# Patient Record
Sex: Male | Born: 1940 | Race: White | Hispanic: No | Marital: Married | Smoking: Former smoker
Health system: Southern US, Community
[De-identification: ages and names within clinical notes are randomized; demographics above are authoritative.]

## PROBLEM LIST (undated history)

## (undated) DIAGNOSIS — Z8546 Personal history of malignant neoplasm of prostate: Secondary | ICD-10-CM

## (undated) DIAGNOSIS — J181 Lobar pneumonia, unspecified organism: Secondary | ICD-10-CM

## (undated) DIAGNOSIS — K5792 Diverticulitis of intestine, part unspecified, without perforation or abscess without bleeding: Secondary | ICD-10-CM

## (undated) DIAGNOSIS — M1990 Unspecified osteoarthritis, unspecified site: Secondary | ICD-10-CM

## (undated) DIAGNOSIS — C439 Malignant melanoma of skin, unspecified: Secondary | ICD-10-CM

## (undated) DIAGNOSIS — E039 Hypothyroidism, unspecified: Secondary | ICD-10-CM

## (undated) DIAGNOSIS — R32 Unspecified urinary incontinence: Secondary | ICD-10-CM

## (undated) DIAGNOSIS — C9110 Chronic lymphocytic leukemia of B-cell type not having achieved remission: Secondary | ICD-10-CM

## (undated) DIAGNOSIS — F419 Anxiety disorder, unspecified: Secondary | ICD-10-CM

## (undated) HISTORY — DX: Chronic lymphocytic leukemia of B-cell type not having achieved remission: C91.10

## (undated) HISTORY — PX: REPLACEMENT TOTAL KNEE: SUR1224

## (undated) HISTORY — DX: Malignant melanoma of skin, unspecified: C43.9

## (undated) HISTORY — DX: Diverticulitis of intestine, part unspecified, without perforation or abscess without bleeding: K57.92

## (undated) HISTORY — PX: COLONOSCOPY: SHX174

## (undated) HISTORY — DX: Unspecified osteoarthritis, unspecified site: M19.90

## (undated) HISTORY — DX: Unspecified urinary incontinence: R32

---

## 1991-03-25 HISTORY — PX: PROSTATECTOMY: SHX69

## 2004-12-03 ENCOUNTER — Other Ambulatory Visit: Payer: Self-pay

## 2004-12-10 ENCOUNTER — Inpatient Hospital Stay: Payer: Self-pay | Admitting: General Practice

## 2005-05-07 ENCOUNTER — Ambulatory Visit: Payer: Self-pay | Admitting: Internal Medicine

## 2006-06-02 ENCOUNTER — Ambulatory Visit: Payer: Self-pay | Admitting: Internal Medicine

## 2006-06-05 ENCOUNTER — Ambulatory Visit: Payer: Self-pay | Admitting: Internal Medicine

## 2006-07-07 ENCOUNTER — Ambulatory Visit: Payer: Self-pay | Admitting: Urology

## 2006-07-07 ENCOUNTER — Other Ambulatory Visit: Payer: Self-pay

## 2006-07-14 ENCOUNTER — Ambulatory Visit: Payer: Self-pay | Admitting: Urology

## 2006-08-27 ENCOUNTER — Ambulatory Visit: Payer: Self-pay | Admitting: Urology

## 2006-11-27 ENCOUNTER — Ambulatory Visit: Payer: Self-pay | Admitting: Urology

## 2007-09-09 ENCOUNTER — Ambulatory Visit: Payer: Self-pay | Admitting: Urology

## 2008-02-24 ENCOUNTER — Ambulatory Visit: Payer: Self-pay | Admitting: Unknown Physician Specialty

## 2008-06-21 ENCOUNTER — Ambulatory Visit: Payer: Self-pay | Admitting: Internal Medicine

## 2008-06-22 ENCOUNTER — Ambulatory Visit: Payer: Self-pay | Admitting: Internal Medicine

## 2008-07-07 ENCOUNTER — Ambulatory Visit: Payer: Self-pay | Admitting: Internal Medicine

## 2008-07-22 ENCOUNTER — Ambulatory Visit: Payer: Self-pay | Admitting: Internal Medicine

## 2008-09-07 ENCOUNTER — Ambulatory Visit: Payer: Self-pay | Admitting: Urology

## 2008-10-06 ENCOUNTER — Ambulatory Visit: Payer: Self-pay | Admitting: Internal Medicine

## 2008-10-22 ENCOUNTER — Ambulatory Visit: Payer: Self-pay | Admitting: Internal Medicine

## 2009-06-22 ENCOUNTER — Ambulatory Visit: Payer: Self-pay | Admitting: Internal Medicine

## 2009-07-02 ENCOUNTER — Ambulatory Visit: Payer: Self-pay | Admitting: Internal Medicine

## 2009-07-06 ENCOUNTER — Ambulatory Visit: Payer: Self-pay | Admitting: Internal Medicine

## 2009-07-22 ENCOUNTER — Ambulatory Visit: Payer: Self-pay | Admitting: Internal Medicine

## 2009-08-02 ENCOUNTER — Ambulatory Visit: Payer: Self-pay | Admitting: Cardiology

## 2009-08-21 ENCOUNTER — Emergency Department: Payer: Self-pay | Admitting: Emergency Medicine

## 2009-08-28 ENCOUNTER — Ambulatory Visit: Payer: Self-pay | Admitting: General Practice

## 2009-08-31 ENCOUNTER — Ambulatory Visit: Payer: Self-pay | Admitting: Urology

## 2009-09-05 ENCOUNTER — Ambulatory Visit: Payer: Self-pay | Admitting: Urology

## 2009-09-06 ENCOUNTER — Ambulatory Visit: Payer: Self-pay | Admitting: Urology

## 2009-09-07 ENCOUNTER — Ambulatory Visit: Payer: Self-pay | Admitting: Internal Medicine

## 2009-09-11 ENCOUNTER — Ambulatory Visit: Payer: Self-pay | Admitting: Urology

## 2009-09-21 ENCOUNTER — Ambulatory Visit: Payer: Self-pay | Admitting: Internal Medicine

## 2009-10-22 ENCOUNTER — Ambulatory Visit: Payer: Self-pay | Admitting: Internal Medicine

## 2009-11-22 ENCOUNTER — Ambulatory Visit: Payer: Self-pay | Admitting: Internal Medicine

## 2009-12-22 ENCOUNTER — Ambulatory Visit: Payer: Self-pay | Admitting: Internal Medicine

## 2010-01-22 ENCOUNTER — Ambulatory Visit: Payer: Self-pay | Admitting: Internal Medicine

## 2010-02-21 ENCOUNTER — Ambulatory Visit: Payer: Self-pay | Admitting: Internal Medicine

## 2010-02-28 ENCOUNTER — Inpatient Hospital Stay: Payer: Self-pay | Admitting: Internal Medicine

## 2010-03-24 ENCOUNTER — Ambulatory Visit: Payer: Self-pay | Admitting: Internal Medicine

## 2010-04-11 ENCOUNTER — Ambulatory Visit: Payer: Self-pay | Admitting: Specialist

## 2010-04-24 ENCOUNTER — Ambulatory Visit: Payer: Self-pay | Admitting: Internal Medicine

## 2010-05-23 ENCOUNTER — Ambulatory Visit: Payer: Self-pay | Admitting: Internal Medicine

## 2010-06-23 ENCOUNTER — Ambulatory Visit: Payer: Self-pay | Admitting: Internal Medicine

## 2010-07-23 ENCOUNTER — Ambulatory Visit: Payer: Self-pay | Admitting: Internal Medicine

## 2010-08-30 ENCOUNTER — Ambulatory Visit: Payer: Self-pay | Admitting: Internal Medicine

## 2010-09-22 ENCOUNTER — Ambulatory Visit: Payer: Self-pay | Admitting: Internal Medicine

## 2010-10-23 ENCOUNTER — Ambulatory Visit: Payer: Self-pay | Admitting: Internal Medicine

## 2010-11-28 ENCOUNTER — Ambulatory Visit: Payer: Self-pay | Admitting: Internal Medicine

## 2010-12-23 ENCOUNTER — Ambulatory Visit: Payer: Self-pay | Admitting: Internal Medicine

## 2011-01-23 ENCOUNTER — Ambulatory Visit: Payer: Self-pay | Admitting: Internal Medicine

## 2011-02-22 ENCOUNTER — Ambulatory Visit: Payer: Self-pay | Admitting: Internal Medicine

## 2011-03-28 ENCOUNTER — Ambulatory Visit: Payer: Self-pay | Admitting: Internal Medicine

## 2011-03-28 LAB — CBC CANCER CENTER
Basophil #: 0.1 x10 3/mm (ref 0.0–0.1)
Basophil %: 1.3 %
Eosinophil #: 0.2 x10 3/mm (ref 0.0–0.7)
HCT: 45.6 % (ref 40.0–52.0)
HGB: 15.6 g/dL (ref 13.0–18.0)
Lymphocyte %: 21 %
MCH: 32.6 pg (ref 26.0–34.0)
MCHC: 34.2 g/dL (ref 32.0–36.0)
Monocyte #: 0.5 x10 3/mm (ref 0.0–0.7)
Neutrophil #: 6.5 x10 3/mm (ref 1.4–6.5)
Neutrophil %: 70.6 %
Platelet: 113 x10 3/mm — ABNORMAL LOW (ref 150–440)
RDW: 14.2 % (ref 11.5–14.5)

## 2011-03-28 LAB — HEPATIC FUNCTION PANEL A (ARMC)
Albumin: 3.8 g/dL (ref 3.4–5.0)
Alkaline Phosphatase: 91 U/L (ref 50–136)
Bilirubin, Direct: 0.1 mg/dL (ref 0.00–0.20)
SGOT(AST): 18 U/L (ref 15–37)
SGPT (ALT): 40 U/L

## 2011-03-28 LAB — CREATININE, SERUM
Creatinine: 1.31 mg/dL — ABNORMAL HIGH (ref 0.60–1.30)
EGFR (African American): >60
EGFR (Non-African Amer.): 57 — ABNORMAL LOW

## 2011-04-25 ENCOUNTER — Ambulatory Visit: Payer: Self-pay | Admitting: Internal Medicine

## 2011-05-09 LAB — CBC CANCER CENTER
Basophil %: 0.4 %
HCT: 43.7 % (ref 40.0–52.0)
HGB: 15 g/dL (ref 13.0–18.0)
Lymphocyte %: 26.7 %
Monocyte #: 0.5 x10 3/mm (ref 0.0–0.7)
Monocyte %: 7.7 %
Neutrophil #: 4.2 x10 3/mm (ref 1.4–6.5)
Platelet: 103 x10 3/mm — ABNORMAL LOW (ref 150–440)
WBC: 6.6 x10 3/mm (ref 3.8–10.6)

## 2011-05-23 ENCOUNTER — Ambulatory Visit: Payer: Self-pay | Admitting: Internal Medicine

## 2011-06-13 LAB — HEPATIC FUNCTION PANEL A (ARMC)
Albumin: 3.6 g/dL (ref 3.4–5.0)
SGOT(AST): 24 U/L (ref 15–37)
SGPT (ALT): 36 U/L
Total Protein: 6.5 g/dL (ref 6.4–8.2)

## 2011-06-13 LAB — CBC CANCER CENTER
Eosinophil #: 0.2 x10 3/mm (ref 0.0–0.7)
Eosinophil %: 3.3 %
Lymphocyte #: 1.4 x10 3/mm (ref 1.0–3.6)
MCH: 33 pg (ref 26.0–34.0)
MCHC: 34 g/dL (ref 32.0–36.0)
MCV: 97 fL (ref 80–100)
Monocyte #: 0.3 x10 3/mm (ref 0.0–0.7)
Neutrophil %: 71.4 %
Platelet: 105 x10 3/mm — ABNORMAL LOW (ref 150–440)
RBC: 4.52 10*6/uL (ref 4.40–5.90)
RDW: 14.4 % (ref 11.5–14.5)

## 2011-06-13 LAB — CREATININE, SERUM
Creatinine: 1.27 mg/dL (ref 0.60–1.30)
EGFR (Non-African Amer.): 59 — ABNORMAL LOW

## 2011-06-13 LAB — LACTATE DEHYDROGENASE: LDH: 177 U/L (ref 87–241)

## 2011-06-23 ENCOUNTER — Ambulatory Visit: Payer: Self-pay | Admitting: Internal Medicine

## 2011-07-07 LAB — CBC CANCER CENTER
Basophil #: 0 x10 3/mm (ref 0.0–0.1)
Eosinophil #: 0.2 x10 3/mm (ref 0.0–0.7)
HGB: 15.5 g/dL (ref 13.0–18.0)
Lymphocyte %: 33.2 %
MCH: 32.2 pg (ref 26.0–34.0)
MCHC: 33 g/dL (ref 32.0–36.0)
MCV: 97 fL (ref 80–100)
Monocyte %: 9.2 %
Neutrophil %: 53.2 %
Platelet: 106 x10 3/mm — ABNORMAL LOW (ref 150–440)
RBC: 4.83 10*6/uL (ref 4.40–5.90)
RDW: 13.8 % (ref 11.5–14.5)
WBC: 5.9 x10 3/mm (ref 3.8–10.6)

## 2011-07-11 ENCOUNTER — Ambulatory Visit: Payer: Self-pay | Admitting: Unknown Physician Specialty

## 2011-07-23 ENCOUNTER — Ambulatory Visit: Payer: Self-pay | Admitting: Internal Medicine

## 2011-08-29 ENCOUNTER — Ambulatory Visit: Payer: Self-pay | Admitting: Internal Medicine

## 2011-08-29 LAB — CBC CANCER CENTER
Eosinophil #: 0.2 x10 3/mm (ref 0.0–0.7)
Eosinophil %: 4.1 %
HGB: 15.5 g/dL (ref 13.0–18.0)
Monocyte %: 8.7 %
Neutrophil #: 2 x10 3/mm (ref 1.4–6.5)
Platelet: 91 x10 3/mm — ABNORMAL LOW (ref 150–440)
RBC: 4.86 10*6/uL (ref 4.40–5.90)
WBC: 4 x10 3/mm (ref 3.8–10.6)

## 2011-08-29 LAB — HEPATIC FUNCTION PANEL A (ARMC)
Albumin: 4.1 g/dL (ref 3.4–5.0)
Bilirubin, Direct: 0.2 mg/dL (ref 0.00–0.20)
SGPT (ALT): 43 U/L

## 2011-08-29 LAB — CREATININE, SERUM: EGFR (Non-African Amer.): 56 — ABNORMAL LOW

## 2011-08-29 LAB — LACTATE DEHYDROGENASE: LDH: 174 U/L (ref 87–241)

## 2011-09-22 ENCOUNTER — Ambulatory Visit: Payer: Self-pay | Admitting: Internal Medicine

## 2011-10-10 LAB — CBC CANCER CENTER
Basophil #: 0 x10 3/mm (ref 0.0–0.1)
Basophil %: 0.4 %
Eosinophil #: 0.1 x10 3/mm (ref 0.0–0.7)
HCT: 45.4 % (ref 40.0–52.0)
HGB: 14.9 g/dL (ref 13.0–18.0)
MCH: 31.9 pg (ref 26.0–34.0)
MCHC: 32.9 g/dL (ref 32.0–36.0)
Monocyte #: 0.3 x10 3/mm (ref 0.2–1.0)
Neutrophil #: 5.5 x10 3/mm (ref 1.4–6.5)
Neutrophil %: 69.8 %
RDW: 13.6 % (ref 11.5–14.5)
WBC: 7.9 x10 3/mm (ref 3.8–10.6)

## 2011-10-10 LAB — CREATININE, SERUM: EGFR (African American): >60

## 2011-10-23 ENCOUNTER — Ambulatory Visit: Payer: Self-pay | Admitting: Internal Medicine

## 2011-11-21 LAB — CBC CANCER CENTER
Basophil %: 1 %
Eosinophil #: 0.2 x10 3/mm (ref 0.0–0.7)
Eosinophil %: 2.4 %
HCT: 47.3 % (ref 40.0–52.0)
Lymphocyte #: 2.5 x10 3/mm (ref 1.0–3.6)
Lymphocyte %: 33.9 %
MCH: 32.3 pg (ref 26.0–34.0)
MCHC: 32.5 g/dL (ref 32.0–36.0)
Neutrophil %: 55.6 %
Platelet: 91 x10 3/mm — ABNORMAL LOW (ref 150–440)
RDW: 14.1 % (ref 11.5–14.5)

## 2011-11-21 LAB — CREATININE, SERUM
EGFR (African American): 57 — ABNORMAL LOW
EGFR (Non-African Amer.): 49 — ABNORMAL LOW

## 2011-11-21 LAB — HEPATIC FUNCTION PANEL A (ARMC)
Bilirubin, Direct: 0.2 mg/dL (ref 0.00–0.20)
Bilirubin,Total: 1 mg/dL (ref 0.2–1.0)
SGPT (ALT): 43 U/L (ref 12–78)

## 2011-11-21 LAB — LACTATE DEHYDROGENASE: LDH: 151 U/L (ref 81–234)

## 2011-11-23 ENCOUNTER — Ambulatory Visit: Payer: Self-pay | Admitting: Internal Medicine

## 2012-02-26 ENCOUNTER — Ambulatory Visit: Payer: Self-pay | Admitting: Internal Medicine

## 2012-02-26 LAB — CBC CANCER CENTER
Basophil %: 0.8 %
Eosinophil #: 0.2 x10 3/mm (ref 0.0–0.7)
Eosinophil %: 2.6 %
HGB: 16.1 g/dL (ref 13.0–18.0)
Lymphocyte %: 39.6 %
MCH: 33.5 pg (ref 26.0–34.0)
MCV: 98 fL (ref 80–100)
Monocyte #: 0.5 x10 3/mm (ref 0.2–1.0)
Monocyte %: 7.2 %
Neutrophil %: 49.8 %
Platelet: 90 x10 3/mm — ABNORMAL LOW (ref 150–440)
RBC: 4.8 10*6/uL (ref 4.40–5.90)
RDW: 13.2 % (ref 11.5–14.5)
WBC: 7.3 x10 3/mm (ref 3.8–10.6)

## 2012-02-26 LAB — CREATININE, SERUM
Creatinine: 1.19 mg/dL (ref 0.60–1.30)
EGFR (African American): >60

## 2012-03-24 ENCOUNTER — Ambulatory Visit: Payer: Self-pay | Admitting: Internal Medicine

## 2012-05-07 ENCOUNTER — Ambulatory Visit: Payer: Self-pay | Admitting: Internal Medicine

## 2012-05-07 LAB — HEPATIC FUNCTION PANEL A (ARMC)
Albumin: 4.1 g/dL (ref 3.4–5.0)
Alkaline Phosphatase: 79 U/L (ref 50–136)
Bilirubin, Direct: 0.2 mg/dL (ref 0.00–0.20)
Bilirubin,Total: 0.8 mg/dL (ref 0.2–1.0)
SGOT(AST): 21 U/L (ref 15–37)
SGPT (ALT): 45 U/L (ref 12–78)

## 2012-05-07 LAB — CREATININE, SERUM: EGFR (Non-African Amer.): 52 — ABNORMAL LOW

## 2012-05-07 LAB — CBC CANCER CENTER
Basophil #: 0.1 x10 3/mm (ref 0.0–0.1)
Eosinophil #: 0.2 x10 3/mm (ref 0.0–0.7)
Eosinophil %: 2.6 %
HCT: 49.9 % (ref 40.0–52.0)
MCH: 32.9 pg (ref 26.0–34.0)
MCHC: 33.8 g/dL (ref 32.0–36.0)
Monocyte %: 6.6 %
WBC: 7.2 x10 3/mm (ref 3.8–10.6)

## 2012-05-07 LAB — LACTATE DEHYDROGENASE: LDH: 153 U/L (ref 85–241)

## 2012-05-22 ENCOUNTER — Ambulatory Visit: Payer: Self-pay | Admitting: Internal Medicine

## 2012-07-02 ENCOUNTER — Ambulatory Visit: Payer: Self-pay | Admitting: Otolaryngology

## 2012-07-22 ENCOUNTER — Ambulatory Visit: Payer: Self-pay | Admitting: Internal Medicine

## 2012-07-29 LAB — CBC CANCER CENTER
Basophil #: 0.1 x10 3/mm (ref 0.0–0.1)
HCT: 47.9 % (ref 40.0–52.0)
HGB: 16.2 g/dL (ref 13.0–18.0)
Lymphocyte #: 6.2 x10 3/mm — ABNORMAL HIGH (ref 1.0–3.6)
Lymphocyte %: 54.7 %
MCH: 32.8 pg (ref 26.0–34.0)
MCHC: 33.8 g/dL (ref 32.0–36.0)
Monocyte %: 4.6 %
WBC: 11.4 x10 3/mm — ABNORMAL HIGH (ref 3.8–10.6)

## 2012-07-29 LAB — CREATININE, SERUM
EGFR (African American): 56 — ABNORMAL LOW
EGFR (Non-African Amer.): 49 — ABNORMAL LOW

## 2012-08-05 ENCOUNTER — Ambulatory Visit: Payer: Self-pay | Admitting: Otolaryngology

## 2012-08-22 ENCOUNTER — Ambulatory Visit: Payer: Self-pay | Admitting: Internal Medicine

## 2012-10-22 ENCOUNTER — Ambulatory Visit: Payer: Self-pay | Admitting: Internal Medicine

## 2012-10-22 LAB — LACTATE DEHYDROGENASE: LDH: 143 U/L (ref 85–241)

## 2012-10-22 LAB — CBC CANCER CENTER
Basophil %: 1 %
HCT: 47.4 % (ref 40.0–52.0)
HGB: 16.4 g/dL (ref 13.0–18.0)
Lymphocyte #: 7.2 x10 3/mm — ABNORMAL HIGH (ref 1.0–3.6)
Lymphocyte %: 58.5 %
MCV: 97 fL (ref 80–100)
Neutrophil %: 34.3 %
Platelet: 82 x10 3/mm — ABNORMAL LOW (ref 150–440)
RBC: 4.88 10*6/uL (ref 4.40–5.90)
WBC: 12.3 x10 3/mm — ABNORMAL HIGH (ref 3.8–10.6)

## 2012-10-22 LAB — CREATININE, SERUM: EGFR (African American): 57 — ABNORMAL LOW

## 2012-10-22 LAB — HEPATIC FUNCTION PANEL A (ARMC)
Albumin: 4 g/dL (ref 3.4–5.0)
Bilirubin, Direct: 0.2 mg/dL (ref 0.00–0.20)
SGOT(AST): 21 U/L (ref 15–37)
SGPT (ALT): 34 U/L (ref 12–78)
Total Protein: 6.4 g/dL (ref 6.4–8.2)

## 2012-11-22 ENCOUNTER — Ambulatory Visit: Payer: Self-pay | Admitting: Internal Medicine

## 2013-01-14 ENCOUNTER — Ambulatory Visit: Payer: Self-pay | Admitting: Internal Medicine

## 2013-01-14 LAB — URINALYSIS, COMPLETE
Bacteria: NONE SEEN
Bilirubin,UR: NEGATIVE
Glucose,UR: NEGATIVE mg/dL (ref 0–75)
Ketone: NEGATIVE
Leukocyte Esterase: NEGATIVE
Nitrite: NEGATIVE
Ph: 6 (ref 4.5–8.0)
Specific Gravity: 1.017 (ref 1.003–1.030)
Squamous Epithelial: 2
WBC UR: NONE SEEN /HPF (ref 0–5)

## 2013-01-14 LAB — COMPREHENSIVE METABOLIC PANEL
Anion Gap: 5 — ABNORMAL LOW (ref 7–16)
BUN: 21 mg/dL — ABNORMAL HIGH (ref 7–18)
Calcium, Total: 9.2 mg/dL (ref 8.5–10.1)
Chloride: 106 mmol/L (ref 98–107)
Creatinine: 1.29 mg/dL (ref 0.60–1.30)
Glucose: 119 mg/dL — ABNORMAL HIGH (ref 65–99)
Osmolality: 282 (ref 275–301)
Potassium: 4.6 mmol/L (ref 3.5–5.1)
SGPT (ALT): 40 U/L (ref 12–78)

## 2013-01-14 LAB — CBC CANCER CENTER
Basophil %: 1 %
HGB: 16.6 g/dL (ref 13.0–18.0)
Lymphocyte %: 71.6 %
MCHC: 33.4 g/dL (ref 32.0–36.0)
Monocyte %: 2.9 %
Neutrophil %: 23.5 %
RBC: 5.02 10*6/uL (ref 4.40–5.90)
WBC: 19.4 x10 3/mm — ABNORMAL HIGH (ref 3.8–10.6)

## 2013-01-14 LAB — TSH: Thyroid Stimulating Horm: 5.14 u[IU]/mL — ABNORMAL HIGH

## 2013-01-14 LAB — LIPID PANEL
Cholesterol: 157 mg/dL (ref 0–200)
HDL Cholesterol: 56 mg/dL (ref 40–60)
Ldl Cholesterol, Calc: 81 mg/dL (ref 0–100)
Triglycerides: 102 mg/dL (ref 0–200)

## 2013-01-14 LAB — CBC WITH DIFFERENTIAL/PLATELET
HGB: 16.6 g/dL (ref 13.0–18.0)
MCH: 33.1 pg (ref 26.0–34.0)
MCHC: 33.7 g/dL (ref 32.0–36.0)
MCV: 98 fL (ref 80–100)
Platelet: 83 10*3/uL — ABNORMAL LOW (ref 150–440)
RDW: 13.4 % (ref 11.5–14.5)
Segmented Neutrophils: 23 %
WBC: 20.4 10*3/uL — ABNORMAL HIGH (ref 3.8–10.6)

## 2013-01-20 ENCOUNTER — Ambulatory Visit: Payer: Self-pay | Admitting: Internal Medicine

## 2013-01-22 ENCOUNTER — Ambulatory Visit: Payer: Self-pay | Admitting: Internal Medicine

## 2013-01-25 LAB — PSA: PSA: 0.3 ng/mL (ref 0.0–4.0)

## 2013-02-21 ENCOUNTER — Ambulatory Visit: Payer: Self-pay | Admitting: Internal Medicine

## 2013-04-14 ENCOUNTER — Ambulatory Visit: Payer: Self-pay | Admitting: Internal Medicine

## 2013-04-14 LAB — CBC CANCER CENTER
BASOS ABS: 0.4 x10 3/mm — AB (ref 0.0–0.1)
BASOS PCT: 1.2 %
Eosinophil #: 0.2 x10 3/mm (ref 0.0–0.7)
Eosinophil %: 0.4 %
HCT: 47.5 % (ref 40.0–52.0)
HGB: 15.6 g/dL (ref 13.0–18.0)
LYMPHS PCT: 81.8 %
Lymphocyte #: 29.1 x10 3/mm — ABNORMAL HIGH (ref 1.0–3.6)
MCH: 32.7 pg (ref 26.0–34.0)
MCHC: 32.8 g/dL (ref 32.0–36.0)
MCV: 100 fL (ref 80–100)
MONOS PCT: 2.6 %
Monocyte #: 0.9 x10 3/mm (ref 0.2–1.0)
NEUTROS ABS: 5 x10 3/mm (ref 1.4–6.5)
Neutrophil %: 14 %
PLATELETS: 94 x10 3/mm — AB (ref 150–440)
RBC: 4.75 10*6/uL (ref 4.40–5.90)
RDW: 13.3 % (ref 11.5–14.5)
WBC: 35.6 x10 3/mm — ABNORMAL HIGH (ref 3.8–10.6)

## 2013-04-14 LAB — HEPATIC FUNCTION PANEL A (ARMC)
ALBUMIN: 4.1 g/dL (ref 3.4–5.0)
AST: 21 U/L (ref 15–37)
Alkaline Phosphatase: 85 U/L
Bilirubin, Direct: 0.2 mg/dL (ref 0.00–0.20)
Bilirubin,Total: 0.8 mg/dL (ref 0.2–1.0)
SGPT (ALT): 36 U/L (ref 12–78)
Total Protein: 6.5 g/dL (ref 6.4–8.2)

## 2013-04-14 LAB — CREATININE, SERUM
Creatinine: 1.2 mg/dL (ref 0.60–1.30)
EGFR (African American): >60
EGFR (Non-African Amer.): 60 — ABNORMAL LOW

## 2013-04-14 LAB — LACTATE DEHYDROGENASE: LDH: 159 U/L (ref 85–241)

## 2013-04-24 ENCOUNTER — Ambulatory Visit: Payer: Self-pay | Admitting: Internal Medicine

## 2013-05-26 ENCOUNTER — Ambulatory Visit: Payer: Self-pay | Admitting: Internal Medicine

## 2013-05-26 LAB — CBC CANCER CENTER
BASOS PCT: 0.7 %
Basophil #: 0.2 x10 3/mm — ABNORMAL HIGH (ref 0.0–0.1)
EOS PCT: 0.8 %
Eosinophil #: 0.2 x10 3/mm (ref 0.0–0.7)
HCT: 47.6 % (ref 40.0–52.0)
HGB: 15.4 g/dL (ref 13.0–18.0)
LYMPHS PCT: 80.9 %
Lymphocyte #: 24.1 x10 3/mm — ABNORMAL HIGH (ref 1.0–3.6)
MCH: 32.4 pg (ref 26.0–34.0)
MCHC: 32.4 g/dL (ref 32.0–36.0)
MCV: 100 fL (ref 80–100)
MONO ABS: 0.9 x10 3/mm (ref 0.2–1.0)
Monocyte %: 2.9 %
NEUTROS PCT: 14.7 %
Neutrophil #: 4.4 x10 3/mm (ref 1.4–6.5)
Platelet: 81 x10 3/mm — ABNORMAL LOW (ref 150–440)
RBC: 4.75 10*6/uL (ref 4.40–5.90)
RDW: 13.3 % (ref 11.5–14.5)
WBC: 29.8 x10 3/mm — AB (ref 3.8–10.6)

## 2013-05-26 LAB — CREATININE, SERUM
CREATININE: 1.3 mg/dL (ref 0.60–1.30)
GFR CALC NON AF AMER: 54 — AB

## 2013-06-22 ENCOUNTER — Ambulatory Visit: Payer: Self-pay | Admitting: Internal Medicine

## 2013-07-08 LAB — CBC CANCER CENTER
BASOS ABS: 0.2 x10 3/mm — AB (ref 0.0–0.1)
Basophil %: 0.6 %
Eosinophil #: 0.3 x10 3/mm (ref 0.0–0.7)
Eosinophil %: 0.7 %
HCT: 49.6 % (ref 40.0–52.0)
HGB: 15.8 g/dL (ref 13.0–18.0)
LYMPHS PCT: 83.3 %
Lymphocyte #: 31.9 x10 3/mm — ABNORMAL HIGH (ref 1.0–3.6)
MCH: 32.3 pg (ref 26.0–34.0)
MCHC: 31.8 g/dL — AB (ref 32.0–36.0)
MCV: 102 fL — ABNORMAL HIGH (ref 80–100)
MONOS PCT: 2.4 %
Monocyte #: 0.9 x10 3/mm (ref 0.2–1.0)
NEUTROS PCT: 13 %
Neutrophil #: 5 x10 3/mm (ref 1.4–6.5)
Platelet: 79 x10 3/mm — ABNORMAL LOW (ref 150–440)
RBC: 4.89 10*6/uL (ref 4.40–5.90)
RDW: 13.2 % (ref 11.5–14.5)
WBC: 38.3 x10 3/mm — AB (ref 3.8–10.6)

## 2013-07-08 LAB — CREATININE, SERUM
Creatinine: 1.18 mg/dL (ref 0.60–1.30)
EGFR (African American): >60
EGFR (Non-African Amer.): >60

## 2013-07-08 LAB — HEPATIC FUNCTION PANEL A (ARMC)
ALBUMIN: 4 g/dL (ref 3.4–5.0)
ALK PHOS: 75 U/L
Bilirubin, Direct: 0.2 mg/dL (ref 0.00–0.20)
Bilirubin,Total: 0.8 mg/dL (ref 0.2–1.0)
SGOT(AST): 14 U/L — ABNORMAL LOW (ref 15–37)
SGPT (ALT): 35 U/L (ref 12–78)
Total Protein: 6.4 g/dL (ref 6.4–8.2)

## 2013-07-08 LAB — LACTATE DEHYDROGENASE: LDH: 131 U/L (ref 85–241)

## 2013-07-22 ENCOUNTER — Ambulatory Visit: Payer: Self-pay | Admitting: Internal Medicine

## 2013-09-08 ENCOUNTER — Ambulatory Visit: Payer: Self-pay | Admitting: Internal Medicine

## 2013-09-08 LAB — CBC CANCER CENTER
BASOS ABS: 0.4 x10 3/mm — AB (ref 0.0–0.1)
Basophil %: 0.8 %
Eosinophil #: 0.3 x10 3/mm (ref 0.0–0.7)
Eosinophil %: 0.7 %
HCT: 47.9 % (ref 40.0–52.0)
HGB: 15.6 g/dL (ref 13.0–18.0)
LYMPHS PCT: 88.1 %
Lymphocyte #: 44.6 x10 3/mm — ABNORMAL HIGH (ref 1.0–3.6)
MCH: 32.8 pg (ref 26.0–34.0)
MCHC: 32.6 g/dL (ref 32.0–36.0)
MCV: 101 fL — ABNORMAL HIGH (ref 80–100)
MONO ABS: 1 x10 3/mm (ref 0.2–1.0)
Monocyte %: 1.9 %
NEUTROS PCT: 8.5 %
Neutrophil #: 4.3 x10 3/mm (ref 1.4–6.5)
Platelet: 71 x10 3/mm — ABNORMAL LOW (ref 150–440)
RBC: 4.77 10*6/uL (ref 4.40–5.90)
RDW: 13.8 % (ref 11.5–14.5)
WBC: 50.7 x10 3/mm — ABNORMAL HIGH (ref 3.8–10.6)

## 2013-09-21 ENCOUNTER — Ambulatory Visit: Payer: Self-pay | Admitting: Internal Medicine

## 2013-10-31 ENCOUNTER — Ambulatory Visit: Payer: Self-pay | Admitting: General Surgery

## 2013-11-08 ENCOUNTER — Ambulatory Visit: Payer: Self-pay | Admitting: Internal Medicine

## 2013-11-10 ENCOUNTER — Ambulatory Visit: Payer: Self-pay | Admitting: Internal Medicine

## 2013-11-10 ENCOUNTER — Ambulatory Visit (INDEPENDENT_AMBULATORY_CARE_PROVIDER_SITE_OTHER): Payer: Medicare Other | Admitting: General Surgery

## 2013-11-10 ENCOUNTER — Encounter: Payer: Self-pay | Admitting: General Surgery

## 2013-11-10 VITALS — BP 130/70 | HR 68 | Resp 16 | Ht 70.0 in | Wt 221.0 lb

## 2013-11-10 DIAGNOSIS — R591 Generalized enlarged lymph nodes: Secondary | ICD-10-CM

## 2013-11-10 DIAGNOSIS — C8590 Non-Hodgkin lymphoma, unspecified, unspecified site: Secondary | ICD-10-CM

## 2013-11-10 DIAGNOSIS — C8589 Other specified types of non-Hodgkin lymphoma, extranodal and solid organ sites: Secondary | ICD-10-CM

## 2013-11-10 DIAGNOSIS — R161 Splenomegaly, not elsewhere classified: Secondary | ICD-10-CM

## 2013-11-10 DIAGNOSIS — R599 Enlarged lymph nodes, unspecified: Secondary | ICD-10-CM

## 2013-11-10 NOTE — Progress Notes (Signed)
Patient ID: Dean Collins, male   DOB: January 01, 1941, 73 y.o.   MRN: [id number]  Chief Complaint  Patient presents with  . Other    Enlarged cervical lymphnode    HPI Dean Collins is a 73 y.o. male who presents for an evaluation of a right cervical lymph node that is enlarged. The patient states his Dermatologist, Dean Barr, MD,  found the enlarged lymph node approximately 1 month ago during a routine skin check. He denies any pain in this area or difficulty swallowing. He has noticed in the last month he is having sweats and increasing shortness of breath with minimal activity.  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 He is followed by Dean Collins, M.Collins.   HPI  Past Medical History  Diagnosis Date  . Incontinence   . Arthritis   . Cancer     CLL prostate  . Melanoma     scalp  . Diverticulitis     Past Surgical History  Procedure Laterality Date  . Replacement total knee      x2  . Prostatectomy  1993  . Colonoscopy  2013-2014?    Family History  Problem Relation Age of Onset  . Heart attack Mother   . Heart attack Father     Social History History  Substance Use Topics  . Smoking status: Former Smoker    Quit date: 03/24/1990  . Smokeless tobacco: Not on file  . Alcohol Use: Yes     Comment: occasionally    No Known Allergies  Current Outpatient Prescriptions  Medication Sig Dispense Refill  . ciprofloxacin (CIPRO) 500 MG tablet Take by mouth 2 (two) times daily.       . metroNIDAZOLE (FLAGYL) 500 MG tablet Take 500 mg by mouth 3 (three) times daily.       . sertraline (ZOLOFT) 50 MG tablet Take 50 mg by mouth at bedtime.       . simvastatin (ZOCOR) 40 MG tablet Take 40 mg by mouth daily at 6 PM.        No current facility-administered medications for this visit.    Review of Systems Review of Systems  Constitutional: Negative.   Respiratory: Negative.   Cardiovascular: Negative.     Blood pressure 130/70, pulse 68, resp.  rate 16, height 5\' 10"  (1.778 m), weight 221 lb (100.245 kg).  Physical Exam Physical Exam  Constitutional: He is oriented to person, place, and time. He appears well-developed and well-nourished.  HENT:  Head:    Neck: Neck supple. No thyromegaly present.  Cardiovascular: Normal rate, regular rhythm and normal heart sounds.   No murmur heard. Pulmonary/Chest: Effort normal. No respiratory distress. He has no decreased breath sounds. He has no wheezes. He has no rhonchi. He has rales in the right lower field and the left lower field.  Abdominal: There is splenomegaly. There is no hepatomegaly. There is no tenderness.    Lymphadenopathy:    He has cervical adenopathy (single nodein the right superior posterior triangle.).    He has axillary adenopathy (right axilla only.).       Right: No inguinal adenopathy present.       Left: No inguinal adenopathy present.  Right axilla 2 cm node.  Neurological: He is alert and oriented to person, place, and time.  Skin: Skin is warm and dry.    Data Reviewed CT scan of the abdomen and pelvis dated November 08, 2013 obtained by the patient's PCP for  suspected diverticulitis was reviewed and compared to January 2015 exam. Marked splenomegaly is noted with significant progression since the ureter study. Incidentally detected were multiple gallstones. Extensive celiac, mesenteric and retroperitoneal adenopathy is noted. Diffuse changes of colonic diverticulosis without evidence of diverticulitis. Assessment    Recurrent lymphoma.     Plan    The case was discussed with Dean Collins, M.Collins. No biopsy is not required to confirm recurrent lymphoma. Dr. Ma Hillock will make arrangements for early patient reassessment to initiate suppressive chemotherapy.     PCP: Dean Collins Ref. Dr. Karle Collins   Robert Bellow 11/11/2013, 6:22 PM

## 2013-11-10 NOTE — Patient Instructions (Signed)
Patient to return as needed. The patient is aware to call back for any questions or concerns. 

## 2013-11-11 DIAGNOSIS — R161 Splenomegaly, not elsewhere classified: Secondary | ICD-10-CM | POA: Insufficient documentation

## 2013-11-11 DIAGNOSIS — C8590 Non-Hodgkin lymphoma, unspecified, unspecified site: Secondary | ICD-10-CM | POA: Insufficient documentation

## 2013-11-11 DIAGNOSIS — R591 Generalized enlarged lymph nodes: Secondary | ICD-10-CM | POA: Insufficient documentation

## 2013-11-11 LAB — CBC CANCER CENTER
Basophil: 1 %
Eosinophil: 1 %
HCT: 36.8 % — ABNORMAL LOW (ref 40.0–52.0)
HGB: 11.6 g/dL — AB (ref 13.0–18.0)
Lymphocytes: 70 %
MCH: 33.7 pg (ref 26.0–34.0)
MCHC: 31.4 g/dL — ABNORMAL LOW (ref 32.0–36.0)
MCV: 107 fL — ABNORMAL HIGH (ref 80–100)
Monocytes: 3 %
Other Cells Blood: 5 %
Platelet: 100 x10 3/mm — ABNORMAL LOW (ref 150–440)
RBC: 3.43 10*6/uL — ABNORMAL LOW (ref 4.40–5.90)
RDW: 15.1 % — ABNORMAL HIGH (ref 11.5–14.5)
Segmented Neutrophils: 4 %
Variant Lymphocyte: 16 %
WBC: 129.4 x10 3/mm — AB (ref 3.8–10.6)

## 2013-11-11 LAB — HEPATIC FUNCTION PANEL A (ARMC)
ALT: 33 U/L
Albumin: 3.8 g/dL (ref 3.4–5.0)
Alkaline Phosphatase: 94 U/L
BILIRUBIN DIRECT: 0.3 mg/dL — AB (ref 0.00–0.20)
Bilirubin,Total: 1.9 mg/dL — ABNORMAL HIGH (ref 0.2–1.0)
SGOT(AST): 29 U/L (ref 15–37)
Total Protein: 6.1 g/dL — ABNORMAL LOW (ref 6.4–8.2)

## 2013-11-11 LAB — CREATININE, SERUM
Creatinine: 1.27 mg/dL (ref 0.60–1.30)
EGFR (African American): >60
EGFR (Non-African Amer.): 55 — ABNORMAL LOW

## 2013-11-11 LAB — LACTATE DEHYDROGENASE: LDH: 446 U/L — ABNORMAL HIGH (ref 85–241)

## 2013-11-11 LAB — URIC ACID: URIC ACID: 5.4 mg/dL (ref 3.5–7.2)

## 2013-11-11 LAB — POTASSIUM: Potassium: 3.9 mmol/L (ref 3.5–5.1)

## 2013-11-22 ENCOUNTER — Ambulatory Visit: Payer: Self-pay | Admitting: Internal Medicine

## 2013-12-05 DIAGNOSIS — R0989 Other specified symptoms and signs involving the circulatory and respiratory systems: Secondary | ICD-10-CM

## 2013-12-05 DIAGNOSIS — M7989 Other specified soft tissue disorders: Secondary | ICD-10-CM

## 2013-12-05 DIAGNOSIS — R0609 Other forms of dyspnea: Secondary | ICD-10-CM

## 2013-12-05 LAB — COMPREHENSIVE METABOLIC PANEL
ALK PHOS: 92 U/L
ALT: 20 U/L
ANION GAP: 8 (ref 7–16)
Albumin: 3.5 g/dL (ref 3.4–5.0)
BUN: 20 mg/dL — ABNORMAL HIGH (ref 7–18)
Bilirubin,Total: 5.4 mg/dL — ABNORMAL HIGH (ref 0.2–1.0)
CALCIUM: 8.3 mg/dL — AB (ref 8.5–10.1)
Chloride: 104 mmol/L (ref 98–107)
Co2: 27 mmol/L (ref 21–32)
Creatinine: 1.22 mg/dL (ref 0.60–1.30)
EGFR (African American): >60
EGFR (Non-African Amer.): 58 — ABNORMAL LOW
Glucose: 158 mg/dL — ABNORMAL HIGH (ref 65–99)
OSMOLALITY: 283 (ref 275–301)
Potassium: 3.7 mmol/L (ref 3.5–5.1)
SGOT(AST): 24 U/L (ref 15–37)
Sodium: 139 mmol/L (ref 136–145)
Total Protein: 5.8 g/dL — ABNORMAL LOW (ref 6.4–8.2)

## 2013-12-05 LAB — CBC CANCER CENTER
BASOS ABS: 1 %
Comment - H1-Com4: NORMAL
Eosinophil: 1 %
HCT: 17.8 % — ABNORMAL LOW (ref 40.0–52.0)
HGB: 5.3 g/dL — AB (ref 13.0–18.0)
LYMPHS PCT: 92 %
MCH: 36.9 pg — AB (ref 26.0–34.0)
MCHC: 29.8 g/dL — AB (ref 32.0–36.0)
MCV: 124 fL — AB (ref 80–100)
MONOS PCT: 1 %
NRBC/100 WBC: 1 /100
Platelet: 119 x10 3/mm — ABNORMAL LOW (ref 150–440)
RBC: 1.44 10*6/uL — AB (ref 4.40–5.90)
RDW: 19.7 % — AB (ref 11.5–14.5)
SEGMENTED NEUTROPHILS: 5 %
WBC: 356 x10 3/mm (ref 3.8–10.6)

## 2013-12-05 LAB — RETICULOCYTES
Absolute Retic Count: 0.259 10*6/uL — ABNORMAL HIGH (ref 0.019–0.186)
Reticulocyte: 17.9 % — ABNORMAL HIGH (ref 0.4–3.1)

## 2013-12-05 LAB — BILIRUBIN, DIRECT: BILIRUBIN DIRECT: 0.4 mg/dL — AB (ref 0.00–0.20)

## 2013-12-07 ENCOUNTER — Inpatient Hospital Stay: Payer: Self-pay | Admitting: Internal Medicine

## 2013-12-07 DIAGNOSIS — R0609 Other forms of dyspnea: Secondary | ICD-10-CM

## 2013-12-07 DIAGNOSIS — R0989 Other specified symptoms and signs involving the circulatory and respiratory systems: Secondary | ICD-10-CM

## 2013-12-07 LAB — CK: CK, Total: 27 U/L — ABNORMAL LOW

## 2013-12-07 LAB — CBC CANCER CENTER
Comment - H1-Com4: NORMAL
HCT: 19.1 % — ABNORMAL LOW (ref 40.0–52.0)
HGB: 5.5 g/dL — AB (ref 13.0–18.0)
Lymphocytes: 90 %
MCH: 36.8 pg — ABNORMAL HIGH (ref 26.0–34.0)
MCHC: 28.9 g/dL — AB (ref 32.0–36.0)
MCV: 127 fL — ABNORMAL HIGH (ref 80–100)
MONOS PCT: 3 %
NRBC/100 WBC: 4 /100
Platelet: 128 x10 3/mm — ABNORMAL LOW (ref 150–440)
RBC: 1.5 10*6/uL — ABNORMAL LOW (ref 4.40–5.90)
RDW: 32.2 % — ABNORMAL HIGH (ref 11.5–14.5)
Segmented Neutrophils: 3 %
VARIANT LYMPHOCYTE - H4-RLYMPH: 4 %
WBC: 374.7 x10 3/mm (ref 3.8–10.6)

## 2013-12-07 LAB — BASIC METABOLIC PANEL
Anion Gap: 8 (ref 7–16)
BUN: 22 mg/dL — ABNORMAL HIGH (ref 7–18)
CALCIUM: 8.5 mg/dL (ref 8.5–10.1)
Chloride: 103 mmol/L (ref 98–107)
Co2: 28 mmol/L (ref 21–32)
Creatinine: 1.27 mg/dL (ref 0.60–1.30)
EGFR (African American): >60
GFR CALC NON AF AMER: 55 — AB
Glucose: 183 mg/dL — ABNORMAL HIGH (ref 65–99)
Osmolality: 286 (ref 275–301)
Potassium: 3.5 mmol/L (ref 3.5–5.1)
SODIUM: 139 mmol/L (ref 136–145)

## 2013-12-07 LAB — IRON AND TIBC
IRON BIND. CAP.(TOTAL): 249 ug/dL — AB (ref 250–450)
IRON SATURATION: 47 %
Iron: 118 ug/dL (ref 65–175)
UNBOUND IRON-BIND. CAP.: 131 ug/dL

## 2013-12-07 LAB — HEPATIC FUNCTION PANEL A (ARMC)
Albumin: 3.6 g/dL (ref 3.4–5.0)
Alkaline Phosphatase: 88 U/L
BILIRUBIN TOTAL: 6 mg/dL — AB (ref 0.2–1.0)
Bilirubin, Direct: 0.5 mg/dL — ABNORMAL HIGH (ref 0.00–0.20)
SGOT(AST): 22 U/L (ref 15–37)
SGPT (ALT): 22 U/L
TOTAL PROTEIN: 5.8 g/dL — AB (ref 6.4–8.2)

## 2013-12-07 LAB — CK-MB
CK-MB: 0.6 ng/mL (ref 0.5–3.6)
CK-MB: 0.8 ng/mL (ref 0.5–3.6)

## 2013-12-07 LAB — URIC ACID: URIC ACID: 7.2 mg/dL (ref 3.5–7.2)

## 2013-12-07 LAB — FOLATE: FOLIC ACID: 40.9 ng/mL (ref 3.1–100.0)

## 2013-12-07 LAB — TROPONIN I

## 2013-12-07 LAB — FERRITIN: Ferritin (ARMC): 357 ng/mL (ref 8–388)

## 2013-12-08 LAB — CBC WITH DIFFERENTIAL/PLATELET
Basophil #: 0.1 10*3/uL (ref 0.0–0.1)
Basophil %: 0 %
EOS ABS: 2.8 10*3/uL — AB (ref 0.0–0.7)
Eosinophil %: 0.7 %
HCT: 24.4 % — AB (ref 40.0–52.0)
HGB: 7.1 g/dL — ABNORMAL LOW (ref 13.0–18.0)
LYMPHS PCT: 91.3 %
MCH: 36 pg — AB (ref 26.0–34.0)
MCHC: 29 g/dL — AB (ref 32.0–36.0)
MCV: 124 fL — ABNORMAL HIGH (ref 80–100)
MONO ABS: 8.4 x10 3/mm — AB (ref 0.2–1.0)
Monocyte %: 2.2 %
NEUTROS PCT: 5.8 %
Neutrophil #: 22.4 10*3/uL — ABNORMAL HIGH (ref 1.4–6.5)
Platelet: 123 10*3/uL — ABNORMAL LOW (ref 150–440)
RBC: 1.96 10*6/uL — ABNORMAL LOW (ref 4.40–5.90)
RDW: 32.8 % — ABNORMAL HIGH (ref 11.5–14.5)

## 2013-12-08 LAB — BASIC METABOLIC PANEL
Anion Gap: 9 (ref 7–16)
BUN: 25 mg/dL — AB (ref 7–18)
Calcium, Total: 8.5 mg/dL (ref 8.5–10.1)
Chloride: 105 mmol/L (ref 98–107)
Co2: 26 mmol/L (ref 21–32)
Creatinine: 1.13 mg/dL (ref 0.60–1.30)
EGFR (African American): >60
Glucose: 163 mg/dL — ABNORMAL HIGH (ref 65–99)
OSMOLALITY: 287 (ref 275–301)
Potassium: 4 mmol/L (ref 3.5–5.1)
Sodium: 140 mmol/L (ref 136–145)

## 2013-12-09 LAB — CBC WITH DIFFERENTIAL/PLATELET
HCT: 23.5 % — ABNORMAL LOW (ref 40.0–52.0)
HGB: 7 g/dL — ABNORMAL LOW (ref 13.0–18.0)
LYMPHS PCT: 95 %
MCH: 36.3 pg — ABNORMAL HIGH (ref 26.0–34.0)
MCHC: 29.8 g/dL — AB (ref 32.0–36.0)
MCV: 122 fL — ABNORMAL HIGH (ref 80–100)
Platelet: 120 10*3/uL — ABNORMAL LOW (ref 150–440)
RBC: 1.93 10*6/uL — ABNORMAL LOW (ref 4.40–5.90)
RDW: 22.1 % — ABNORMAL HIGH (ref 11.5–14.5)
Segmented Neutrophils: 5 %
WBC: >300 10*3/uL — ABNORMAL HIGH (ref 3.8–10.6)

## 2013-12-09 LAB — BASIC METABOLIC PANEL
ANION GAP: 9 (ref 7–16)
BUN: 26 mg/dL — AB (ref 7–18)
CALCIUM: 8.2 mg/dL — AB (ref 8.5–10.1)
CHLORIDE: 108 mmol/L — AB (ref 98–107)
CO2: 25 mmol/L (ref 21–32)
Creatinine: 1.21 mg/dL (ref 0.60–1.30)
EGFR (Non-African Amer.): 59 — ABNORMAL LOW
GLUCOSE: 121 mg/dL — AB (ref 65–99)
OSMOLALITY: 289 (ref 275–301)
POTASSIUM: 4 mmol/L (ref 3.5–5.1)
SODIUM: 142 mmol/L (ref 136–145)

## 2013-12-12 LAB — HEPATIC FUNCTION PANEL A (ARMC)
ALT: 34 U/L
Albumin: 4 g/dL (ref 3.4–5.0)
Alkaline Phosphatase: 80 U/L
BILIRUBIN TOTAL: 3.1 mg/dL — AB (ref 0.2–1.0)
Bilirubin, Direct: 0.5 mg/dL — ABNORMAL HIGH (ref 0.00–0.20)
SGOT(AST): 42 U/L — ABNORMAL HIGH (ref 15–37)
TOTAL PROTEIN: 6.2 g/dL — AB (ref 6.4–8.2)

## 2013-12-12 LAB — BASIC METABOLIC PANEL
ANION GAP: 7 (ref 7–16)
BUN: 23 mg/dL — ABNORMAL HIGH (ref 7–18)
CHLORIDE: 105 mmol/L (ref 98–107)
CO2: 28 mmol/L (ref 21–32)
Calcium, Total: 8.5 mg/dL (ref 8.5–10.1)
Creatinine: 1.18 mg/dL (ref 0.60–1.30)
EGFR (African American): >60
EGFR (Non-African Amer.): >60
GLUCOSE: 97 mg/dL (ref 65–99)
OSMOLALITY: 283 (ref 275–301)
Potassium: 5.9 mmol/L — ABNORMAL HIGH (ref 3.5–5.1)
Sodium: 140 mmol/L (ref 136–145)

## 2013-12-12 LAB — URIC ACID: URIC ACID: 6.8 mg/dL (ref 3.5–7.2)

## 2013-12-14 LAB — CBC CANCER CENTER
HCT: 37.9 % — ABNORMAL LOW (ref 40.0–52.0)
HGB: 11.9 g/dL — AB (ref 13.0–18.0)
LYMPHS PCT: 86 %
MCH: 39.6 pg — ABNORMAL HIGH (ref 26.0–34.0)
MCHC: 31.5 g/dL — AB (ref 32.0–36.0)
MCV: 126 fL — ABNORMAL HIGH (ref 80–100)
MONOS PCT: 2 %
Platelet: 128 x10 3/mm — ABNORMAL LOW (ref 150–440)
RBC: 3.01 10*6/uL — AB (ref 4.40–5.90)
RDW: 17.8 % — ABNORMAL HIGH (ref 11.5–14.5)
Segmented Neutrophils: 3 %
Variant Lymphocyte: 9 %
WBC: 372.7 x10 3/mm (ref 3.8–10.6)

## 2013-12-14 LAB — BASIC METABOLIC PANEL
Anion Gap: 9 (ref 7–16)
BUN: 21 mg/dL — ABNORMAL HIGH (ref 7–18)
CALCIUM: 8.5 mg/dL (ref 8.5–10.1)
CHLORIDE: 102 mmol/L (ref 98–107)
Co2: 26 mmol/L (ref 21–32)
Creatinine: 1.28 mg/dL (ref 0.60–1.30)
EGFR (Non-African Amer.): 55 — ABNORMAL LOW
GLUCOSE: 146 mg/dL — AB (ref 65–99)
OSMOLALITY: 279 (ref 275–301)
Potassium: 3.9 mmol/L (ref 3.5–5.1)
Sodium: 137 mmol/L (ref 136–145)

## 2013-12-14 LAB — HEPATIC FUNCTION PANEL A (ARMC)
ALBUMIN: 3.9 g/dL (ref 3.4–5.0)
Alkaline Phosphatase: 76 U/L
BILIRUBIN DIRECT: 0.3 mg/dL — AB (ref 0.00–0.20)
BILIRUBIN TOTAL: 2.7 mg/dL — AB (ref 0.2–1.0)
SGOT(AST): 22 U/L (ref 15–37)
SGPT (ALT): 42 U/L
TOTAL PROTEIN: 5.9 g/dL — AB (ref 6.4–8.2)

## 2013-12-21 LAB — BASIC METABOLIC PANEL
ANION GAP: 7 (ref 7–16)
BUN: 23 mg/dL — AB (ref 7–18)
Calcium, Total: 8 mg/dL — ABNORMAL LOW (ref 8.5–10.1)
Chloride: 104 mmol/L (ref 98–107)
Co2: 26 mmol/L (ref 21–32)
Creatinine: 1.33 mg/dL — ABNORMAL HIGH (ref 0.60–1.30)
GFR CALC NON AF AMER: 56 — AB
GLUCOSE: 194 mg/dL — AB (ref 65–99)
Osmolality: 283 (ref 275–301)
POTASSIUM: 5.7 mmol/L — AB (ref 3.5–5.1)
SODIUM: 137 mmol/L (ref 136–145)

## 2013-12-21 LAB — CBC CANCER CENTER
BASOS ABS: 0.3 x10 3/mm — AB (ref 0.0–0.1)
Basophil %: 0.1 %
EOS ABS: 0.8 x10 3/mm — AB (ref 0.0–0.7)
EOS PCT: 0.3 %
HCT: 34.9 % — ABNORMAL LOW (ref 40.0–52.0)
HGB: 10.5 g/dL — ABNORMAL LOW (ref 13.0–18.0)
LYMPHS PCT: 93.8 %
Lymphocyte #: 283.6 x10 3/mm — ABNORMAL HIGH (ref 1.0–3.6)
MCH: 35.5 pg — ABNORMAL HIGH (ref 26.0–34.0)
MCHC: 30.1 g/dL — AB (ref 32.0–36.0)
MCV: 118 fL — AB (ref 80–100)
Monocyte #: 3.5 x10 3/mm — ABNORMAL HIGH (ref 0.2–1.0)
Monocyte %: 1.2 %
NEUTROS ABS: 13.9 x10 3/mm — AB (ref 1.4–6.5)
Neutrophil %: 4.6 %
Platelet: 99 x10 3/mm — ABNORMAL LOW (ref 150–440)
RBC: 2.97 10*6/uL — ABNORMAL LOW (ref 4.40–5.90)
RDW: 16.6 % — ABNORMAL HIGH (ref 11.5–14.5)
WBC: >300 x10 3/mm (ref 3.8–10.6)

## 2013-12-21 LAB — HEPATIC FUNCTION PANEL A (ARMC)
ALBUMIN: 4.1 g/dL (ref 3.4–5.0)
ALK PHOS: 59 U/L
AST: 26 U/L (ref 15–37)
BILIRUBIN DIRECT: 0.3 mg/dL — AB (ref 0.00–0.20)
BILIRUBIN TOTAL: 1.7 mg/dL — AB (ref 0.2–1.0)
SGPT (ALT): 32 U/L
Total Protein: 6 g/dL — ABNORMAL LOW (ref 6.4–8.2)

## 2013-12-21 LAB — URIC ACID: Uric Acid: 4.7 mg/dL (ref 3.5–7.2)

## 2013-12-22 ENCOUNTER — Ambulatory Visit: Payer: Self-pay | Admitting: Internal Medicine

## 2013-12-23 LAB — CBC CANCER CENTER
HCT: 35.8 % — AB (ref 40.0–52.0)
HGB: 11 g/dL — ABNORMAL LOW (ref 13.0–18.0)
Lymphocytes: 97 %
MCH: 35.8 pg — AB (ref 26.0–34.0)
MCHC: 30.8 g/dL — AB (ref 32.0–36.0)
MCV: 116 fL — AB (ref 80–100)
Platelet: 87 x10 3/mm — ABNORMAL LOW (ref 150–440)
RBC: 3.08 10*6/uL — ABNORMAL LOW (ref 4.40–5.90)
RDW: 16 % — AB (ref 11.5–14.5)
Segmented Neutrophils: 3 %
WBC: 305.3 x10 3/mm (ref 3.8–10.6)

## 2013-12-23 LAB — BASIC METABOLIC PANEL
Anion Gap: 6 — ABNORMAL LOW (ref 7–16)
BUN: 22 mg/dL — ABNORMAL HIGH (ref 7–18)
CALCIUM: 8.7 mg/dL (ref 8.5–10.1)
CO2: 31 mmol/L (ref 21–32)
CREATININE: 1.23 mg/dL (ref 0.60–1.30)
Chloride: 105 mmol/L (ref 98–107)
Glucose: 125 mg/dL — ABNORMAL HIGH (ref 65–99)
Osmolality: 288 (ref 275–301)
Potassium: 4 mmol/L (ref 3.5–5.1)
Sodium: 142 mmol/L (ref 136–145)

## 2013-12-23 LAB — HEPATIC FUNCTION PANEL A (ARMC)
ALT: 32 U/L
AST: 13 U/L — AB (ref 15–37)
Albumin: 4.2 g/dL (ref 3.4–5.0)
Alkaline Phosphatase: 56 U/L
BILIRUBIN TOTAL: 1.6 mg/dL — AB (ref 0.2–1.0)
Bilirubin, Direct: 0.4 mg/dL — ABNORMAL HIGH (ref 0.00–0.20)
TOTAL PROTEIN: 5.9 g/dL — AB (ref 6.4–8.2)

## 2013-12-23 LAB — URIC ACID: Uric Acid: 4.2 mg/dL (ref 3.5–7.2)

## 2013-12-28 LAB — CBC CANCER CENTER
Basophil #: 0.1 x10 3/mm (ref 0.0–0.1)
Basophil %: 0.1 %
EOS ABS: 0.7 x10 3/mm (ref 0.0–0.7)
EOS PCT: 0.2 %
HCT: 39.1 % — ABNORMAL LOW (ref 40.0–52.0)
HGB: 12.3 g/dL — ABNORMAL LOW (ref 13.0–18.0)
LYMPHS ABS: 277 x10 3/mm — AB (ref 1.0–3.6)
Lymphocyte %: 94.9 %
MCH: 35.5 pg — ABNORMAL HIGH (ref 26.0–34.0)
MCHC: 31.4 g/dL — AB (ref 32.0–36.0)
MCV: 113 fL — ABNORMAL HIGH (ref 80–100)
MONOS PCT: 0.6 %
Monocyte #: 1.8 x10 3/mm — ABNORMAL HIGH (ref 0.2–1.0)
Neutrophil #: 12.3 x10 3/mm — ABNORMAL HIGH (ref 1.4–6.5)
Neutrophil %: 4.2 %
Platelet: 77 x10 3/mm — ABNORMAL LOW (ref 150–440)
RBC: 3.46 10*6/uL — ABNORMAL LOW (ref 4.40–5.90)
RDW: 15.8 % — ABNORMAL HIGH (ref 11.5–14.5)
WBC: 291.9 x10 3/mm — AB (ref 3.8–10.6)

## 2013-12-28 LAB — URIC ACID: Uric Acid: 4.4 mg/dL (ref 3.5–7.2)

## 2013-12-28 LAB — POTASSIUM: Potassium: 3.5 mmol/L (ref 3.5–5.1)

## 2013-12-28 LAB — CREATININE, SERUM: Creatinine: 1.21 mg/dL (ref 0.60–1.30)

## 2013-12-28 LAB — BILIRUBIN, TOTAL: Bilirubin,Total: 1.1 mg/dL — ABNORMAL HIGH (ref 0.2–1.0)

## 2014-01-04 LAB — CBC CANCER CENTER
HCT: 39 % — AB (ref 40.0–52.0)
HGB: 12.5 g/dL — AB (ref 13.0–18.0)
Lymphocytes: 93 %
MCH: 35.7 pg — ABNORMAL HIGH (ref 26.0–34.0)
MCHC: 32.1 g/dL (ref 32.0–36.0)
MCV: 111 fL — AB (ref 80–100)
MONOS PCT: 1 %
Platelet: 79 x10 3/mm — ABNORMAL LOW (ref 150–440)
RBC: 3.5 10*6/uL — ABNORMAL LOW (ref 4.40–5.90)
RDW: 15 % — ABNORMAL HIGH (ref 11.5–14.5)
SEGMENTED NEUTROPHILS: 4 %
Variant Lymphocyte: 2 %
WBC: 237.3 x10 3/mm — AB (ref 3.8–10.6)

## 2014-01-04 LAB — CREATININE, SERUM
Creatinine: 1.16 mg/dL (ref 0.60–1.30)
EGFR (African American): >60

## 2014-01-04 LAB — BILIRUBIN, TOTAL: Bilirubin,Total: 0.9 mg/dL (ref 0.2–1.0)

## 2014-01-04 LAB — URIC ACID: URIC ACID: 3.8 mg/dL (ref 3.5–7.2)

## 2014-01-04 LAB — POTASSIUM: POTASSIUM: 5.5 mmol/L — AB (ref 3.5–5.1)

## 2014-01-06 ENCOUNTER — Emergency Department: Payer: Self-pay | Admitting: Student

## 2014-01-06 LAB — COMPREHENSIVE METABOLIC PANEL
ALT: 34 U/L
AST: 29 U/L (ref 15–37)
Albumin: 4.2 g/dL (ref 3.4–5.0)
Alkaline Phosphatase: 65 U/L
Anion Gap: 6 — ABNORMAL LOW (ref 7–16)
BILIRUBIN TOTAL: 1.5 mg/dL — AB (ref 0.2–1.0)
BUN: 20 mg/dL — AB (ref 7–18)
CALCIUM: 8.2 mg/dL — AB (ref 8.5–10.1)
CHLORIDE: 105 mmol/L (ref 98–107)
CREATININE: 1.02 mg/dL (ref 0.60–1.30)
Co2: 26 mmol/L (ref 21–32)
EGFR (African American): >60
EGFR (Non-African Amer.): >60
Glucose: 154 mg/dL — ABNORMAL HIGH (ref 65–99)
Osmolality: 280 (ref 275–301)
Potassium: 5.7 mmol/L — ABNORMAL HIGH (ref 3.5–5.1)
SODIUM: 137 mmol/L (ref 136–145)
TOTAL PROTEIN: 6.4 g/dL (ref 6.4–8.2)

## 2014-01-06 LAB — CBC
HCT: 40.1 % (ref 40.0–52.0)
HGB: 12.7 g/dL — ABNORMAL LOW (ref 13.0–18.0)
MCH: 34.7 pg — ABNORMAL HIGH (ref 26.0–34.0)
MCHC: 31.6 g/dL — AB (ref 32.0–36.0)
MCV: 110 fL — AB (ref 80–100)
Platelet: 86 10*3/uL — ABNORMAL LOW (ref 150–440)
RBC: 3.65 10*6/uL — ABNORMAL LOW (ref 4.40–5.90)
RDW: 14.7 % — ABNORMAL HIGH (ref 11.5–14.5)
WBC: 238.7 10*3/uL — ABNORMAL HIGH (ref 3.8–10.6)

## 2014-01-06 LAB — POTASSIUM: Potassium: 4.1 mmol/L (ref 3.5–5.1)

## 2014-01-11 LAB — CBC CANCER CENTER
HCT: 41.1 % (ref 40.0–52.0)
HGB: 13.2 g/dL (ref 13.0–18.0)
Lymphocytes: 89 %
MCH: 34.9 pg — AB (ref 26.0–34.0)
MCHC: 32.1 g/dL (ref 32.0–36.0)
MCV: 109 fL — AB (ref 80–100)
Monocytes: 2 %
PLATELETS: 110 x10 3/mm — AB (ref 150–440)
RBC: 3.78 10*6/uL — AB (ref 4.40–5.90)
RDW: 14.7 % — ABNORMAL HIGH (ref 11.5–14.5)
SEGMENTED NEUTROPHILS: 5 %
VARIANT LYMPHOCYTE - H4-RLYMPH: 4 %
WBC: 219.8 x10 3/mm — AB (ref 3.8–10.6)

## 2014-01-11 LAB — BASIC METABOLIC PANEL
Anion Gap: 7 (ref 7–16)
BUN: 18 mg/dL (ref 7–18)
Calcium, Total: 8.9 mg/dL (ref 8.5–10.1)
Chloride: 103 mmol/L (ref 98–107)
Co2: 30 mmol/L (ref 21–32)
Creatinine: 1.37 mg/dL — ABNORMAL HIGH (ref 0.60–1.30)
EGFR (African American): >60
EGFR (Non-African Amer.): 54 — ABNORMAL LOW
Glucose: 155 mg/dL — ABNORMAL HIGH (ref 65–99)
OSMOLALITY: 284 (ref 275–301)
POTASSIUM: 4.1 mmol/L (ref 3.5–5.1)
Sodium: 140 mmol/L (ref 136–145)

## 2014-01-11 LAB — HEPATIC FUNCTION PANEL A (ARMC)
ALBUMIN: 3.9 g/dL (ref 3.4–5.0)
AST: 30 U/L (ref 15–37)
Alkaline Phosphatase: 74 U/L
Bilirubin, Direct: 0.2 mg/dL (ref 0.0–0.2)
Bilirubin,Total: 0.9 mg/dL (ref 0.2–1.0)
SGPT (ALT): 45 U/L
TOTAL PROTEIN: 6.2 g/dL — AB (ref 6.4–8.2)

## 2014-01-11 LAB — URIC ACID: Uric Acid: 4.2 mg/dL (ref 3.5–7.2)

## 2014-01-18 ENCOUNTER — Telehealth: Payer: Self-pay | Admitting: General Surgery

## 2014-01-18 LAB — BASIC METABOLIC PANEL
Anion Gap: 7 (ref 7–16)
BUN: 24 mg/dL — ABNORMAL HIGH (ref 7–18)
Calcium, Total: 9 mg/dL (ref 8.5–10.1)
Chloride: 104 mmol/L (ref 98–107)
Co2: 30 mmol/L (ref 21–32)
Creatinine: 1.24 mg/dL (ref 0.60–1.30)
EGFR (African American): >60
Glucose: 197 mg/dL — ABNORMAL HIGH (ref 65–99)
Osmolality: 291 (ref 275–301)
Potassium: 4.9 mmol/L (ref 3.5–5.1)
SODIUM: 141 mmol/L (ref 136–145)

## 2014-01-18 LAB — CBC CANCER CENTER
BASOS ABS: 1 %
HCT: 41.1 % (ref 40.0–52.0)
HGB: 13 g/dL (ref 13.0–18.0)
Lymphocytes: 89 %
MCH: 33.9 pg (ref 26.0–34.0)
MCHC: 31.7 g/dL — ABNORMAL LOW (ref 32.0–36.0)
MCV: 107 fL — AB (ref 80–100)
MONOS PCT: 1 %
PLATELETS: 125 x10 3/mm — AB (ref 150–440)
RBC: 3.84 10*6/uL — ABNORMAL LOW (ref 4.40–5.90)
RDW: 14.9 % — ABNORMAL HIGH (ref 11.5–14.5)
Segmented Neutrophils: 4 %
Variant Lymphocyte: 5 %
WBC: 205.3 x10 3/mm — ABNORMAL HIGH (ref 3.8–10.6)

## 2014-01-18 LAB — URIC ACID: Uric Acid: 5.7 mg/dL (ref 3.5–7.2)

## 2014-01-18 NOTE — Telephone Encounter (Signed)
01-18-14@ 10:00am  SHERRY WITH DR [facility]  CALLED & STATES PT NEEDS A PORT PLACEMENT. DOES HE NEED TO BE SEEN FOR AN OFC VISIT? LAST SEEN 11-10-13 BY JWB. SHERRY CAN BE REACHED @ [phone].SHE STATED SHE WILL CALL PT./MTH

## 2014-01-22 ENCOUNTER — Ambulatory Visit: Payer: Self-pay | Admitting: Internal Medicine

## 2014-01-23 NOTE — Telephone Encounter (Signed)
Patient will need to be seen prior to procedure.. Arrange for brief OV on Wednesday,  11/4 and post placement any time after that.

## 2014-01-23 NOTE — Telephone Encounter (Signed)
Patient's surgery has been scheduled for 01-30-14 at [facility].   This patient will come in for a pre-op visit on Wednesday, 01-25-14 at 8:45 am.  Wife aware of instructions. She verbalizes understanding.

## 2014-01-25 ENCOUNTER — Encounter: Payer: Self-pay | Admitting: General Surgery

## 2014-01-25 ENCOUNTER — Ambulatory Visit (INDEPENDENT_AMBULATORY_CARE_PROVIDER_SITE_OTHER): Payer: Medicare Other | Admitting: General Surgery

## 2014-01-25 VITALS — BP 140/72 | HR 80 | Resp 16 | Ht 69.0 in | Wt 134.0 lb

## 2014-01-25 DIAGNOSIS — C8590 Non-Hodgkin lymphoma, unspecified, unspecified site: Secondary | ICD-10-CM

## 2014-01-25 NOTE — Patient Instructions (Addendum)
The patient is aware to call back for any questions or concerns.  Implanted Port Home Guide An implanted port is a type of central line that is placed under the skin. Central lines are used to provide IV access when treatment or nutrition needs to be given through a person's veins. Implanted ports are used for long-term IV access. An implanted port may be placed because:   You need IV medicine that would be irritating to the small veins in your hands or arms.   You need long-term IV medicines, such as antibiotics.   You need IV nutrition for a long period.   You need frequent blood draws for lab tests.   You need dialysis.  Implanted ports are usually placed in the chest area, but they can also be placed in the upper arm, the abdomen, or the leg. An implanted port has two main parts:   Reservoir. The reservoir is round and will appear as a small, raised area under your skin. The reservoir is the part where a needle is inserted to give medicines or draw blood.   Catheter. The catheter is a thin, flexible tube that extends from the reservoir. The catheter is placed into a large vein. Medicine that is inserted into the reservoir goes into the catheter and then into the vein.  HOW WILL I CARE FOR MY INCISION SITE? Do not get the incision site wet. Bathe or shower as directed by your health care provider.  HOW IS MY PORT ACCESSED? Special steps must be taken to access the port:   Before the port is accessed, a numbing cream can be placed on the skin. This helps numb the skin over the port site.   Your health care provider uses a sterile technique to access the port.  Your health care provider must put on a mask and sterile gloves.  The skin over your port is cleaned carefully with an antiseptic and allowed to dry.  The port is gently pinched between sterile gloves, and a needle is inserted into the port.  Only "non-coring" port needles should be used to access the port. Once the  port is accessed, a blood return should be checked. This helps ensure that the port is in the vein and is not clogged.   If your port needs to remain accessed for a constant infusion, a clear (transparent) bandage will be placed over the needle site. The bandage and needle will need to be changed every week, or as directed by your health care provider.   Keep the bandage covering the needle clean and dry. Do not get it wet. Follow your health care provider's instructions on how to take a shower or bath while the port is accessed.   If your port does not need to stay accessed, no bandage is needed over the port.  WHAT IS FLUSHING? Flushing helps keep the port from getting clogged. Follow your health care provider's instructions on how and when to flush the port. Ports are usually flushed with saline solution or a medicine called heparin. The need for flushing will depend on how the port is used.   If the port is used for intermittent medicines or blood draws, the port will need to be flushed:   After medicines have been given.   After blood has been drawn.   As part of routine maintenance.   If a constant infusion is running, the port may not need to be flushed.  HOW LONG WILL MY PORT STAY   IMPLANTED? The port can stay in for as long as your health care provider thinks it is needed. When it is time for the port to come out, surgery will be done to remove it. The procedure is similar to the one performed when the port was put in.  WHEN SHOULD I SEEK IMMEDIATE MEDICAL CARE? When you have an implanted port, you should seek immediate medical care if:   You notice a bad smell coming from the incision site.   You have swelling, redness, or drainage at the incision site.   You have more swelling or pain at the port site or the surrounding area.   You have a fever that is not controlled with medicine. Document Released: 03/10/2005 Document Revised: 12/29/2012 Document Reviewed:  11/15/2012 Exit[employer] Patient Information 2015 [employer]. This information is not intended to replace advice given to you by your health care provider. Make sure you discuss any questions you have with your health care provider.  

## 2014-01-25 NOTE — Progress Notes (Signed)
Patient ID: Dean Collins, male   DOB: June 12, 1940, 73 y.o.   MRN: [id number]  Chief Complaint  Patient presents with  . Pre-op Exam    port placement    HPI Dean Collins is a 73 y.o. male.  Here today to discuss having a port placed for the treatment of chronic lymphocytic leukemia. 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 He is followed by Leia Alf, M.D.  He is followed by Leia Alf, M.D.    HPI  Past Medical History  Diagnosis Date  . Incontinence   . Arthritis   . Cancer     CLL prostate  . Melanoma     scalp  . Diverticulitis   . CLL (chronic lymphocytic leukemia) 2003    Past Surgical History  Procedure Laterality Date  . Replacement total knee      x2  . Prostatectomy  1993  . Colonoscopy  2013-2014?    Family History  Problem Relation Age of Onset  . Heart attack Mother   . Heart attack Father     Social History History  Substance Use Topics  . Smoking status: Former Smoker    Quit date: 03/24/1990  . Smokeless tobacco: Never Used  . Alcohol Use: 0.0 oz/week    0 Not specified per week     Comment: occasionally    No Known Allergies  Current Outpatient Prescriptions  Medication Sig Dispense Refill  . levothyroxine (SYNTHROID, LEVOTHROID) 50 MCG tablet Take 50 mcg by mouth daily before breakfast.    . sertraline (ZOLOFT) 50 MG tablet Take 50 mg by mouth at bedtime.     . simvastatin (ZOCOR) 40 MG tablet Take 40 mg by mouth daily at 6 PM.      No current facility-administered medications for this visit.    Review of Systems Review of Systems  Constitutional: Negative.   Respiratory: Negative.   Cardiovascular: Negative.     Blood pressure 140/72, pulse 80, resp. rate 16, height 5\' 9"  (1.753 m), weight 134 lb (60.782 kg).  Physical Exam Physical Exam  Constitutional: He is oriented to person, place, and time. He appears well-developed and well-nourished.  Neck: Neck supple.  Cardiovascular: Normal rate,  regular rhythm and normal heart sounds.   Pulmonary/Chest: Effort normal and breath sounds normal.  Lymphadenopathy:    He has no cervical adenopathy.  Neurological: He is alert and oriented to person, place, and time.  Skin: Skin is warm and dry.    Data Reviewed Procedure request from medical oncology.  Assessment    Candidate for PowerPort placement.     Plan    Arrangements are in place for PowerPort placement on Monday, 01/30/2014.    Risk and benefits of venous port placement reviewed.  PCP:  Cornelious Bryant, M.D   Hervey Ard W 01/26/2014, 8:31 AM

## 2014-01-26 ENCOUNTER — Other Ambulatory Visit: Payer: Self-pay | Admitting: General Surgery

## 2014-01-26 ENCOUNTER — Ambulatory Visit: Payer: Self-pay | Admitting: General Surgery

## 2014-01-26 DIAGNOSIS — C8590 Non-Hodgkin lymphoma, unspecified, unspecified site: Secondary | ICD-10-CM

## 2014-01-30 ENCOUNTER — Encounter: Payer: Self-pay | Admitting: General Surgery

## 2014-01-30 ENCOUNTER — Ambulatory Visit: Payer: Self-pay | Admitting: General Surgery

## 2014-01-30 DIAGNOSIS — C8590 Non-Hodgkin lymphoma, unspecified, unspecified site: Secondary | ICD-10-CM | POA: Diagnosis not present

## 2014-01-30 LAB — PLATELET COUNT: PLATELETS: 117 10*3/uL — AB (ref 150–440)

## 2014-01-31 ENCOUNTER — Encounter: Payer: Self-pay | Admitting: General Surgery

## 2014-02-01 LAB — COMPREHENSIVE METABOLIC PANEL
ANION GAP: 5 — AB (ref 7–16)
Albumin: 3.8 g/dL (ref 3.4–5.0)
Alkaline Phosphatase: 76 U/L
BILIRUBIN TOTAL: 0.8 mg/dL (ref 0.2–1.0)
BUN: 22 mg/dL — ABNORMAL HIGH (ref 7–18)
CREATININE: 1.3 mg/dL (ref 0.60–1.30)
Calcium, Total: 8.4 mg/dL — ABNORMAL LOW (ref 8.5–10.1)
Chloride: 107 mmol/L (ref 98–107)
Co2: 29 mmol/L (ref 21–32)
EGFR (African American): >60
EGFR (Non-African Amer.): 57 — ABNORMAL LOW
Glucose: 155 mg/dL — ABNORMAL HIGH (ref 65–99)
OSMOLALITY: 288 (ref 275–301)
POTASSIUM: 4.9 mmol/L (ref 3.5–5.1)
SGOT(AST): 19 U/L (ref 15–37)
SGPT (ALT): 24 U/L
SODIUM: 141 mmol/L (ref 136–145)
Total Protein: 5.9 g/dL — ABNORMAL LOW (ref 6.4–8.2)

## 2014-02-01 LAB — CBC CANCER CENTER
BASOS PCT: 0.4 %
Basophil #: 0.5 x10 3/mm — ABNORMAL HIGH (ref 0.0–0.1)
EOS PCT: 0.4 %
Eosinophil #: 0.5 x10 3/mm (ref 0.0–0.7)
HCT: 43.8 % (ref 40.0–52.0)
HGB: 13.6 g/dL (ref 13.0–18.0)
LYMPHS PCT: 89.8 %
Lymphocyte #: 120.8 x10 3/mm — ABNORMAL HIGH (ref 1.0–3.6)
MCH: 33.2 pg (ref 26.0–34.0)
MCHC: 31.1 g/dL — ABNORMAL LOW (ref 32.0–36.0)
MCV: 107 fL — ABNORMAL HIGH (ref 80–100)
MONO ABS: 2.5 x10 3/mm — AB (ref 0.2–1.0)
MONOS PCT: 1.9 %
NEUTROS ABS: 10.1 x10 3/mm — AB (ref 1.4–6.5)
Neutrophil %: 7.5 %
Platelet: 107 x10 3/mm — ABNORMAL LOW (ref 150–440)
RBC: 4.11 10*6/uL — ABNORMAL LOW (ref 4.40–5.90)
RDW: 14.9 % — ABNORMAL HIGH (ref 11.5–14.5)
WBC: 134.3 x10 3/mm — ABNORMAL HIGH (ref 3.8–10.6)

## 2014-02-08 LAB — CBC CANCER CENTER
Basophil #: 0.5 x10 3/mm — ABNORMAL HIGH (ref 0.0–0.1)
Basophil %: 0.3 %
EOS PCT: 0.3 %
Eosinophil #: 0.6 x10 3/mm (ref 0.0–0.7)
HCT: 42.3 % (ref 40.0–52.0)
HGB: 13.6 g/dL (ref 13.0–18.0)
Lymphocyte #: 152.1 x10 3/mm — ABNORMAL HIGH (ref 1.0–3.6)
Lymphocyte %: 89.6 %
MCH: 33 pg (ref 26.0–34.0)
MCHC: 32.1 g/dL (ref 32.0–36.0)
MCV: 103 fL — ABNORMAL HIGH (ref 80–100)
MONOS PCT: 1.6 %
Monocyte #: 2.6 x10 3/mm — ABNORMAL HIGH (ref 0.2–1.0)
NEUTROS ABS: 13.9 x10 3/mm — AB (ref 1.4–6.5)
Neutrophil %: 8.2 %
Platelet: 104 x10 3/mm — ABNORMAL LOW (ref 150–440)
RBC: 4.12 10*6/uL — ABNORMAL LOW (ref 4.40–5.90)
RDW: 14.5 % (ref 11.5–14.5)
WBC: 169.6 x10 3/mm — AB (ref 3.8–10.6)

## 2014-02-08 LAB — CREATININE, SERUM
CREATININE: 1.01 mg/dL (ref 0.60–1.30)
EGFR (African American): >60

## 2014-02-08 LAB — POTASSIUM: Potassium: 4.4 mmol/L (ref 3.5–5.1)

## 2014-02-15 LAB — CBC CANCER CENTER
Comment - H1-Com2: NORMAL
EOS PCT: 1 %
HCT: 43 % (ref 40.0–52.0)
HGB: 13.7 g/dL (ref 13.0–18.0)
LYMPHS PCT: 85 %
MCH: 32.4 pg (ref 26.0–34.0)
MCHC: 31.8 g/dL — ABNORMAL LOW (ref 32.0–36.0)
MCV: 102 fL — ABNORMAL HIGH (ref 80–100)
Monocytes: 3 %
PLATELETS: 84 x10 3/mm — AB (ref 150–440)
RBC: 4.22 10*6/uL — ABNORMAL LOW (ref 4.40–5.90)
RDW: 14.6 % — ABNORMAL HIGH (ref 11.5–14.5)
Segmented Neutrophils: 5 %
VARIANT LYMPHOCYTE - H4-RLYMPH: 6 %
WBC: 86.6 x10 3/mm — AB (ref 3.8–10.6)

## 2014-02-15 LAB — HEPATIC FUNCTION PANEL A (ARMC)
ALT: 31 U/L
AST: 27 U/L (ref 15–37)
Albumin: 3.8 g/dL (ref 3.4–5.0)
Alkaline Phosphatase: 76 U/L
Bilirubin, Direct: 0.1 mg/dL (ref 0.0–0.2)
Bilirubin,Total: 0.6 mg/dL (ref 0.2–1.0)
Total Protein: 5.9 g/dL — ABNORMAL LOW (ref 6.4–8.2)

## 2014-02-15 LAB — URIC ACID: Uric Acid: 4.1 mg/dL (ref 3.5–7.2)

## 2014-02-15 LAB — BASIC METABOLIC PANEL
ANION GAP: 6 — AB (ref 7–16)
BUN: 20 mg/dL — ABNORMAL HIGH (ref 7–18)
CALCIUM: 8.2 mg/dL — AB (ref 8.5–10.1)
CHLORIDE: 109 mmol/L — AB (ref 98–107)
CREATININE: 1.06 mg/dL (ref 0.60–1.30)
Co2: 29 mmol/L (ref 21–32)
EGFR (Non-African Amer.): >60
GLUCOSE: 116 mg/dL — AB (ref 65–99)
Osmolality: 290 (ref 275–301)
Potassium: 4.7 mmol/L (ref 3.5–5.1)
SODIUM: 144 mmol/L (ref 136–145)

## 2014-02-21 ENCOUNTER — Ambulatory Visit: Payer: Self-pay | Admitting: Internal Medicine

## 2014-02-22 LAB — CBC CANCER CENTER
Basophil #: 0.5 x10 3/mm — ABNORMAL HIGH (ref 0.0–0.1)
Basophil %: 0.8 %
Eosinophil #: 0.5 x10 3/mm (ref 0.0–0.7)
Eosinophil %: 0.7 %
HCT: 41.1 % (ref 40.0–52.0)
HGB: 13.4 g/dL (ref 13.0–18.0)
LYMPHS PCT: 89.5 %
Lymphocyte #: 59.5 x10 3/mm — ABNORMAL HIGH (ref 1.0–3.6)
MCH: 32.5 pg (ref 26.0–34.0)
MCHC: 32.6 g/dL (ref 32.0–36.0)
MCV: 100 fL (ref 80–100)
MONO ABS: 1.6 x10 3/mm — AB (ref 0.2–1.0)
Monocyte %: 2.4 %
NEUTROS ABS: 4.4 x10 3/mm (ref 1.4–6.5)
Neutrophil %: 6.6 %
Platelet: 106 x10 3/mm — ABNORMAL LOW (ref 150–440)
RBC: 4.12 10*6/uL — AB (ref 4.40–5.90)
RDW: 14.6 % — ABNORMAL HIGH (ref 11.5–14.5)
WBC: 66.5 x10 3/mm — ABNORMAL HIGH (ref 3.8–10.6)

## 2014-02-22 LAB — CREATININE, SERUM
Creatinine: 1.2 mg/dL (ref 0.60–1.30)
EGFR (African American): >60
EGFR (Non-African Amer.): >60

## 2014-02-22 LAB — URIC ACID: URIC ACID: 4.2 mg/dL (ref 3.5–7.2)

## 2014-02-22 LAB — POTASSIUM: POTASSIUM: 4.1 mmol/L (ref 3.5–5.1)

## 2014-03-01 LAB — CBC CANCER CENTER
Basophil #: 0.5 x10 3/mm — ABNORMAL HIGH (ref 0.0–0.1)
Basophil %: 0.8 %
Eosinophil #: 0.4 x10 3/mm (ref 0.0–0.7)
Eosinophil %: 0.6 %
HCT: 40.7 % (ref 40.0–52.0)
HGB: 13.2 g/dL (ref 13.0–18.0)
LYMPHS PCT: 88 %
Lymphocyte #: 56.1 x10 3/mm — ABNORMAL HIGH (ref 1.0–3.6)
MCH: 31.6 pg (ref 26.0–34.0)
MCHC: 32.4 g/dL (ref 32.0–36.0)
MCV: 98 fL (ref 80–100)
MONOS PCT: 1.2 %
Monocyte #: 0.8 x10 3/mm (ref 0.2–1.0)
NEUTROS PCT: 9.4 %
Neutrophil #: 6 x10 3/mm (ref 1.4–6.5)
PLATELETS: 100 x10 3/mm — AB (ref 150–440)
RBC: 4.17 10*6/uL — ABNORMAL LOW (ref 4.40–5.90)
RDW: 14.1 % (ref 11.5–14.5)
WBC: 63.8 x10 3/mm — AB (ref 3.8–10.6)

## 2014-03-01 LAB — POTASSIUM: Potassium: 4.2 mmol/L (ref 3.5–5.1)

## 2014-03-01 LAB — CREATININE, SERUM
Creatinine: 1.05 mg/dL (ref 0.60–1.30)
EGFR (African American): >60
EGFR (Non-African Amer.): >60

## 2014-03-08 LAB — CBC CANCER CENTER
Basophil #: 0.3 x10 3/mm — ABNORMAL HIGH (ref 0.0–0.1)
Basophil %: 0.5 %
Eosinophil #: 0.3 x10 3/mm (ref 0.0–0.7)
Eosinophil %: 0.5 %
HCT: 42.5 % (ref 40.0–52.0)
HGB: 13.7 g/dL (ref 13.0–18.0)
LYMPHS ABS: 49.5 x10 3/mm — AB (ref 1.0–3.6)
Lymphocyte %: 87.8 %
MCH: 31.4 pg (ref 26.0–34.0)
MCHC: 32.1 g/dL (ref 32.0–36.0)
MCV: 98 fL (ref 80–100)
MONO ABS: 1.3 x10 3/mm — AB (ref 0.2–1.0)
Monocyte %: 2.4 %
NEUTROS ABS: 4.9 x10 3/mm (ref 1.4–6.5)
Neutrophil %: 8.8 %
Platelet: 81 x10 3/mm — ABNORMAL LOW (ref 150–440)
RBC: 4.35 10*6/uL — ABNORMAL LOW (ref 4.40–5.90)
RDW: 14.8 % — AB (ref 11.5–14.5)
WBC: 56.4 x10 3/mm — AB (ref 3.8–10.6)

## 2014-03-08 LAB — BASIC METABOLIC PANEL
Anion Gap: 5 — ABNORMAL LOW (ref 7–16)
BUN: 21 mg/dL — AB (ref 7–18)
CHLORIDE: 106 mmol/L (ref 98–107)
CO2: 29 mmol/L (ref 21–32)
Calcium, Total: 8.6 mg/dL (ref 8.5–10.1)
Creatinine: 1.08 mg/dL (ref 0.60–1.30)
EGFR (Non-African Amer.): >60
Glucose: 112 mg/dL — ABNORMAL HIGH (ref 65–99)
Osmolality: 283 (ref 275–301)
POTASSIUM: 4.5 mmol/L (ref 3.5–5.1)
Sodium: 140 mmol/L (ref 136–145)

## 2014-03-08 LAB — HEPATIC FUNCTION PANEL A (ARMC)
ALT: 28 U/L
Albumin: 4 g/dL (ref 3.4–5.0)
Alkaline Phosphatase: 82 U/L
BILIRUBIN TOTAL: 0.6 mg/dL (ref 0.2–1.0)
Bilirubin, Direct: 0.1 mg/dL (ref 0.0–0.2)
SGOT(AST): 21 U/L (ref 15–37)
Total Protein: 6 g/dL — ABNORMAL LOW (ref 6.4–8.2)

## 2014-03-08 LAB — URIC ACID: Uric Acid: 4.7 mg/dL (ref 3.5–7.2)

## 2014-03-24 ENCOUNTER — Ambulatory Visit: Payer: Self-pay | Admitting: Internal Medicine

## 2014-03-29 LAB — CBC CANCER CENTER
Basophil #: 0.2 x10 3/mm — ABNORMAL HIGH (ref 0.0–0.1)
Basophil %: 0.5 %
EOS PCT: 0.7 %
Eosinophil #: 0.3 x10 3/mm (ref 0.0–0.7)
HCT: 42.2 % (ref 40.0–52.0)
HGB: 13.7 g/dL (ref 13.0–18.0)
LYMPHS PCT: 89.3 %
Lymphocyte #: 43 x10 3/mm — ABNORMAL HIGH (ref 1.0–3.6)
MCH: 31.4 pg (ref 26.0–34.0)
MCHC: 32.5 g/dL (ref 32.0–36.0)
MCV: 97 fL (ref 80–100)
MONO ABS: 0.2 x10 3/mm (ref 0.2–1.0)
Monocyte %: 0.3 %
Neutrophil #: 4.4 x10 3/mm (ref 1.4–6.5)
Neutrophil %: 9.2 %
Platelet: 69 x10 3/mm — ABNORMAL LOW (ref 150–440)
RBC: 4.37 10*6/uL — ABNORMAL LOW (ref 4.40–5.90)
RDW: 15.1 % — ABNORMAL HIGH (ref 11.5–14.5)
WBC: 48.1 x10 3/mm — ABNORMAL HIGH (ref 3.8–10.6)

## 2014-03-29 LAB — CREATININE, SERUM: Creatinine: 1.21 mg/dL (ref 0.60–1.30)

## 2014-03-29 LAB — URIC ACID: Uric Acid: 5.6 mg/dL (ref 3.5–7.2)

## 2014-03-29 LAB — POTASSIUM: POTASSIUM: 4.3 mmol/L (ref 3.5–5.1)

## 2014-04-05 LAB — CBC CANCER CENTER
BASOS ABS: 0.3 x10 3/mm — AB (ref 0.0–0.1)
BASOS PCT: 0.6 %
EOS ABS: 0.3 x10 3/mm (ref 0.0–0.7)
Eosinophil %: 0.7 %
HCT: 43.7 % (ref 40.0–52.0)
HGB: 13.8 g/dL (ref 13.0–18.0)
Lymphocyte #: 43.3 x10 3/mm — ABNORMAL HIGH (ref 1.0–3.6)
Lymphocyte %: 90.7 %
MCH: 31.2 pg (ref 26.0–34.0)
MCHC: 31.7 g/dL — AB (ref 32.0–36.0)
MCV: 99 fL (ref 80–100)
MONO ABS: 1.1 x10 3/mm — AB (ref 0.2–1.0)
Monocyte %: 2.3 %
NEUTROS PCT: 5.7 %
Neutrophil #: 2.7 x10 3/mm (ref 1.4–6.5)
PLATELETS: 69 x10 3/mm — AB (ref 150–440)
RBC: 4.43 10*6/uL (ref 4.40–5.90)
RDW: 15.3 % — ABNORMAL HIGH (ref 11.5–14.5)
WBC: 47.8 x10 3/mm — ABNORMAL HIGH (ref 3.8–10.6)

## 2014-04-12 LAB — CBC CANCER CENTER
Basophil #: 0.4 x10 3/mm — ABNORMAL HIGH (ref 0.0–0.1)
Basophil %: 0.7 %
Eosinophil #: 0.4 x10 3/mm (ref 0.0–0.7)
Eosinophil %: 0.7 %
HCT: 43.3 % (ref 40.0–52.0)
HGB: 13.9 g/dL (ref 13.0–18.0)
Lymphocyte #: 49.2 x10 3/mm — ABNORMAL HIGH (ref 1.0–3.6)
Lymphocyte %: 89.7 %
MCH: 30.5 pg (ref 26.0–34.0)
MCHC: 32 g/dL (ref 32.0–36.0)
MCV: 95 fL (ref 80–100)
MONOS PCT: 2.8 %
Monocyte #: 1.6 x10 3/mm — ABNORMAL HIGH (ref 0.2–1.0)
Neutrophil #: 3.3 x10 3/mm (ref 1.4–6.5)
Neutrophil %: 6.1 %
Platelet: 83 x10 3/mm — ABNORMAL LOW (ref 150–440)
RBC: 4.54 10*6/uL (ref 4.40–5.90)
RDW: 14.9 % — AB (ref 11.5–14.5)
WBC: 54.9 x10 3/mm — ABNORMAL HIGH (ref 3.8–10.6)

## 2014-04-19 LAB — CBC CANCER CENTER
BASOS PCT: 0.5 %
Basophil #: 0.2 x10 3/mm — ABNORMAL HIGH (ref 0.0–0.1)
EOS PCT: 0.6 %
Eosinophil #: 0.3 x10 3/mm (ref 0.0–0.7)
HCT: 42.7 % (ref 40.0–52.0)
HGB: 13.7 g/dL (ref 13.0–18.0)
LYMPHS PCT: 89.8 %
Lymphocyte #: 42 x10 3/mm — ABNORMAL HIGH (ref 1.0–3.6)
MCH: 30.8 pg (ref 26.0–34.0)
MCHC: 32.1 g/dL (ref 32.0–36.0)
MCV: 96 fL (ref 80–100)
Monocyte #: 1.8 x10 3/mm — ABNORMAL HIGH (ref 0.2–1.0)
Monocyte %: 3.7 %
NEUTROS PCT: 5.4 %
Neutrophil #: 2.5 x10 3/mm (ref 1.4–6.5)
Platelet: 76 x10 3/mm — ABNORMAL LOW (ref 150–440)
RBC: 4.45 10*6/uL (ref 4.40–5.90)
RDW: 15.2 % — ABNORMAL HIGH (ref 11.5–14.5)
WBC: 46.8 x10 3/mm — ABNORMAL HIGH (ref 3.8–10.6)

## 2014-04-19 LAB — HEPATIC FUNCTION PANEL A (ARMC)
ALK PHOS: 85 U/L (ref 46–116)
ALT: 30 U/L (ref 14–63)
Albumin: 4.1 g/dL (ref 3.4–5.0)
BILIRUBIN TOTAL: 0.8 mg/dL (ref 0.2–1.0)
Bilirubin, Direct: 0.1 mg/dL (ref 0.0–0.2)
SGOT(AST): 20 U/L (ref 15–37)
Total Protein: 6.1 g/dL — ABNORMAL LOW (ref 6.4–8.2)

## 2014-04-19 LAB — CREATININE, SERUM: Creatinine: 1.08 mg/dL (ref 0.60–1.30)

## 2014-04-19 LAB — URIC ACID: Uric Acid: 5.3 mg/dL (ref 3.5–7.2)

## 2014-04-24 ENCOUNTER — Ambulatory Visit: Payer: Self-pay | Admitting: Internal Medicine

## 2014-04-26 LAB — CBC CANCER CENTER
BASOS PCT: 1.3 %
Basophil #: 0.5 x10 3/mm — ABNORMAL HIGH (ref 0.0–0.1)
Eosinophil #: 0.3 x10 3/mm (ref 0.0–0.7)
Eosinophil %: 0.9 %
HCT: 41.8 % (ref 40.0–52.0)
HGB: 13.7 g/dL (ref 13.0–18.0)
Lymphocyte #: 33.3 x10 3/mm — ABNORMAL HIGH (ref 1.0–3.6)
Lymphocyte %: 90.1 %
MCH: 31.1 pg (ref 26.0–34.0)
MCHC: 32.7 g/dL (ref 32.0–36.0)
MCV: 95 fL (ref 80–100)
MONO ABS: 0.5 x10 3/mm (ref 0.2–1.0)
Monocyte %: 1.5 %
NEUTROS ABS: 2.3 x10 3/mm (ref 1.4–6.5)
Neutrophil %: 6.2 %
Platelet: 68 x10 3/mm — ABNORMAL LOW (ref 150–440)
RBC: 4.39 10*6/uL — ABNORMAL LOW (ref 4.40–5.90)
RDW: 15.4 % — ABNORMAL HIGH (ref 11.5–14.5)
WBC: 36.9 x10 3/mm — ABNORMAL HIGH (ref 3.8–10.6)

## 2014-04-26 LAB — CREATININE, SERUM
Creatinine: 1.12 mg/dL (ref 0.60–1.30)
EGFR (African American): >60
EGFR (Non-African Amer.): >60

## 2014-05-23 ENCOUNTER — Ambulatory Visit
Admit: 2014-05-23 | Disposition: A | Discharge status: Home / Self Care | Payer: Self-pay | Attending: Internal Medicine | Admitting: Internal Medicine

## 2014-06-16 LAB — CREATININE, SERUM
Creatinine: 0.96 mg/dL
EGFR (African American): >60

## 2014-06-16 LAB — CBC CANCER CENTER
BASOS ABS: 0.2 x10 3/mm — AB (ref 0.0–0.1)
Basophil %: 0.6 %
Eosinophil #: 0.3 x10 3/mm (ref 0.0–0.7)
Eosinophil %: 1 %
HCT: 41.8 % (ref 40.0–52.0)
HGB: 13.7 g/dL (ref 13.0–18.0)
LYMPHS PCT: 93 %
Lymphocyte #: 31.6 x10 3/mm — ABNORMAL HIGH (ref 1.0–3.6)
MCH: 31.5 pg (ref 26.0–34.0)
MCHC: 32.8 g/dL (ref 32.0–36.0)
MCV: 96 fL (ref 80–100)
MONOS PCT: 1.8 %
Monocyte #: 0.6 x10 3/mm (ref 0.2–1.0)
NEUTROS ABS: 1.2 x10 3/mm — AB (ref 1.4–6.5)
Neutrophil %: 3.6 %
Platelet: 64 x10 3/mm — ABNORMAL LOW (ref 150–440)
RBC: 4.36 10*6/uL — ABNORMAL LOW (ref 4.40–5.90)
RDW: 15.4 % — ABNORMAL HIGH (ref 11.5–14.5)
WBC: 33.9 x10 3/mm — AB (ref 3.8–10.6)

## 2014-06-22 LAB — CBC CANCER CENTER
BASOS PCT: 0.7 %
Basophil #: 0.4 x10 3/mm — ABNORMAL HIGH (ref 0.0–0.1)
Eosinophil #: 0.4 x10 3/mm (ref 0.0–0.7)
Eosinophil %: 0.8 %
HCT: 44.2 % (ref 40.0–52.0)
HGB: 14.4 g/dL (ref 13.0–18.0)
Lymphocyte #: 46.2 x10 3/mm — ABNORMAL HIGH (ref 1.0–3.6)
Lymphocyte %: 93.8 %
MCH: 31.2 pg (ref 26.0–34.0)
MCHC: 32.6 g/dL (ref 32.0–36.0)
MCV: 96 fL (ref 80–100)
MONOS PCT: 2.1 %
Monocyte #: 1 x10 3/mm (ref 0.2–1.0)
NEUTROS ABS: 1.3 x10 3/mm — AB (ref 1.4–6.5)
Neutrophil %: 2.6 %
Platelet: 72 x10 3/mm — ABNORMAL LOW (ref 150–440)
RBC: 4.6 10*6/uL (ref 4.40–5.90)
RDW: 15 % — ABNORMAL HIGH (ref 11.5–14.5)
WBC: 49.2 x10 3/mm — AB (ref 3.8–10.6)

## 2014-06-23 ENCOUNTER — Ambulatory Visit
Admit: 2014-06-23 | Disposition: A | Discharge status: Home / Self Care | Payer: Self-pay | Attending: Internal Medicine | Admitting: Internal Medicine

## 2014-06-29 LAB — CBC CANCER CENTER
Basophil #: 0.1 x10 3/mm (ref 0.0–0.1)
Basophil %: 0.4 %
Eosinophil #: 0.3 x10 3/mm (ref 0.0–0.7)
Eosinophil %: 0.9 %
HCT: 43.9 % (ref 40.0–52.0)
HGB: 14.2 g/dL (ref 13.0–18.0)
LYMPHS ABS: 34.3 x10 3/mm — AB (ref 1.0–3.6)
Lymphocyte %: 91.8 %
MCH: 31 pg (ref 26.0–34.0)
MCHC: 32.4 g/dL (ref 32.0–36.0)
MCV: 96 fL (ref 80–100)
MONO ABS: 1.4 x10 3/mm — AB (ref 0.2–1.0)
Monocyte %: 3.7 %
NEUTROS PCT: 3.2 %
Neutrophil #: 1.2 x10 3/mm — ABNORMAL LOW (ref 1.4–6.5)
PLATELETS: 76 x10 3/mm — AB (ref 150–440)
RBC: 4.59 10*6/uL (ref 4.40–5.90)
RDW: 15.3 % — AB (ref 11.5–14.5)
WBC: 37.3 x10 3/mm — AB (ref 3.8–10.6)

## 2014-06-29 LAB — HEPATIC FUNCTION PANEL A (ARMC)
ALBUMIN: 4.2 g/dL
AST: 26 U/L
Alkaline Phosphatase: 62 U/L
Bilirubin, Direct: 0.2 mg/dL
Bilirubin,Total: 0.9 mg/dL
Indirect Bilirubin: 0.7
SGPT (ALT): 25 U/L
TOTAL PROTEIN: 6.2 g/dL — AB

## 2014-06-29 LAB — CREATININE, SERUM
Creatinine: 0.91 mg/dL
EGFR (African American): >60
EGFR (Non-African Amer.): >60

## 2014-06-29 LAB — URIC ACID: URIC ACID: 5.4 mg/dL

## 2014-07-05 LAB — CBC CANCER CENTER
Basophil #: 0.5 x10 3/mm — ABNORMAL HIGH (ref 0.0–0.1)
Basophil %: 1.5 %
EOS ABS: 0.3 x10 3/mm (ref 0.0–0.7)
Eosinophil %: 0.8 %
HCT: 42.8 % (ref 40.0–52.0)
HGB: 14 g/dL (ref 13.0–18.0)
LYMPHS PCT: 94 %
Lymphocyte #: 30.2 x10 3/mm — ABNORMAL HIGH (ref 1.0–3.6)
MCH: 31.3 pg (ref 26.0–34.0)
MCHC: 32.8 g/dL (ref 32.0–36.0)
MCV: 96 fL (ref 80–100)
MONOS PCT: 2.2 %
Monocyte #: 0.7 x10 3/mm (ref 0.2–1.0)
Neutrophil #: 0.5 x10 3/mm — ABNORMAL LOW (ref 1.4–6.5)
Neutrophil %: 1.5 %
PLATELETS: 67 x10 3/mm — AB (ref 150–440)
RBC: 4.47 10*6/uL (ref 4.40–5.90)
RDW: 15.4 % — ABNORMAL HIGH (ref 11.5–14.5)
WBC: 32.1 x10 3/mm — AB (ref 3.8–10.6)

## 2014-07-05 LAB — CREATININE, SERUM: CREATININE: 1.14 mg/dL

## 2014-07-06 LAB — CBC CANCER CENTER
BASOS ABS: 0.4 x10 3/mm — AB (ref 0.0–0.1)
Basophil %: 0.8 %
Eosinophil #: 0.1 x10 3/mm (ref 0.0–0.7)
Eosinophil %: 0.2 %
HCT: 43.1 % (ref 40.0–52.0)
HGB: 13.8 g/dL (ref 13.0–18.0)
LYMPHS PCT: 92.8 %
Lymphocyte #: 47 x10 3/mm — ABNORMAL HIGH (ref 1.0–3.6)
MCH: 30.9 pg (ref 26.0–34.0)
MCHC: 32 g/dL (ref 32.0–36.0)
MCV: 97 fL (ref 80–100)
MONOS PCT: 0.6 %
Monocyte #: 0.3 x10 3/mm (ref 0.2–1.0)
NEUTROS PCT: 5.6 %
Neutrophil #: 2.9 x10 3/mm (ref 1.4–6.5)
PLATELETS: 71 x10 3/mm — AB (ref 150–440)
RBC: 4.47 10*6/uL (ref 4.40–5.90)
RDW: 15.2 % — AB (ref 11.5–14.5)
WBC: 50.7 x10 3/mm — ABNORMAL HIGH (ref 3.8–10.6)

## 2014-07-14 NOTE — Op Note (Signed)
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  PROCEDURES: 1.  Image-guided sinus surgery.  2.  Left anterior and posterior ethmoidectomy with tissue removal.  3.  Left dacryocystorhinostomy with placement of stents.   SURGEONCarlis Abbott, MD.  DESCRIPTION OF THE PROCEDURE:  The image-guided sinus surgery system mask (Stryker navigation) was attached and registration was carried out in the standard fashion using the appropriate fiduciary points. Calibration of the system was confirmed and extensive review of the CT scan in all three dimensions preoperatively and intraoperatively was carried out.  Each instrument was registered and confirmed for anatomic accuracy.  The left uncinate process was taken down completely with pediatric side-biting through cutting forceps. The natural maxillary sinus ostium was identified and enlarged.  The ethmoid sinuses were then taken down from anterior to posterior preserving the skull base and lamina papyracea. The ethmoid cavity was then irrigated copiously with saline. Attention was directed to the eye where progressive punctal dilators were placed The Silastic stents were placed in the superior punctum first, then in the inferior punctum. They were brought out through the nose and tied in the ethmoid cavity.  Temporary Telfa pledget was placed following approximately half of the unit of Surgiflo. The patient was then returned to anesthesia, allowed to emerge from anesthesia in the operating room and taken to the recovery room in stable condition. There were no complications. Estimated blood loss 10 mL.   ____________________________ J. Nadeen Landau, MD jmc:ce D: 08/06/2012 20:30:29 ET T: 08/07/2012  07:18:34 ET JOB#: [id number]  cc: Janalee Dane, MD, <Dictator> Nicholos Johns MD ELECTRONICALLY SIGNED 08/10/2012 6:24

## 2014-07-15 NOTE — H&P (Signed)
PATIENT NAME:  Dean Collins, Dean Collins MR#:  [id number] DATE OF BIRTH:  25-Nov-1940  DATE OF ADMISSION:  12/07/2013  REASON FOR ADMISSION AND CHIEF COMPLAINT: Acute hemolytic anemia, chronic lymphocytic leukemia with progression, has dyspnea with likely congestive heart failure.   HISTORY OF PRESENT ILLNESS: The patient is a 74 year old gentleman with known history of relapsed chronic lymphocytic leukemia, recently started on next-line therapy with ibrutinib orally. The patient medication about 10-12 days ago. Over the last weekend he started feeling progressively sick and developed jaundice, progressive weakness. He also has progressive dyspnea on minimal exertion and sometimes at rest, but denies any obvious wheezing. No new cough, hemoptysis or sputum production. Denies fevers or chills. He also has developed hemolytic anemia with hemoglobin less than 6, no bleeding symptoms. He was seen on September 14th at which time direct Coombs test done is reported positive, along with mostly indirect bilirubinemia. He was started on prednisone on September 14th. Today, states that he continues to have progressive dyspnea and that leg swelling is still worse despite taking 2 tablets of Lasix daily. He denies any chest pain, but is having some burping. Denies falls or loss of consciousness.   PAST MEDICAL HISTORY AND SURGICAL HISTORY:  1.  Chronic lymphocytic leukemia as described above.  2.  Hyperlipidemia.  3.  Diverticulosis.  4.  History of right middle lobe lung infiltrate, likely inflammatory.  5.  Environmental allergies. 6.  History of tubular adenomas resected by colonoscopy 2008.  7.  History of prostate cancer, status post radical prostatectomy in 1993.  8.  Osteoarthritis.  9.  History of urticaria.  10.  History of nephrolithiasis.  11.  Sleep disturbance.  12.  Left total hip replacement in 2005.  13.  Cryoablation left renal mass, core needle biopsy showed oncocytoma.   FAMILY HISTORY: Remarkable  for heart disease. Denies hematological disorders or malignancy.   SOCIAL HISTORY: Denies smoking, has occasional alcohol intake. Denies recreational drug usage. Lives with his wife.   ALLERGIES: No known drug allergies.   HOME MEDICATIONS: Aleve 220 mg 2 tablets q. 12 hours p.r.n., allopurinol 100 mg daily, Dulera 5 mcg/100 mcg inhaler 2 puffs b.i.d. p.r.n., Lasix 20 mg 2 tablets daily, ibrutinib 420 mg p.o. daily (stopped on September 14th), levothyroxine 50 mcg daily, potassium chloride 10 mEq along with Lasix, prednisone 80 mg daily started on September 14th, Rolaids as needed, sertraline 50 mg at bedtime, simvastatin 40 mg at bedtime, tramadol 50 mg q. 6 hours p.r.n. for pain.   REVIEW OF SYSTEMS:  CONSTITUTIONAL: As in HPI.  HEENT: Denies any headaches. Mild dizziness on getting up and ambulating. No epistaxis, ear or jaw pain. No new sinus symptoms. Recently left ear was feeling full but no pain or discharge.  CARDIAC: As in HPI.  LUNGS: As in HPI.  GASTROINTESTINAL: Denies any vomiting, diarrhea, bright red blood in stools or melena. Has mild epigastric/right upper quadrant discomfort. Denies any progressive left upper quadrant pain, which is actually better overall.  GENITOURINARY: No dysuria or hematuria.  EXTREMITIES: Bilateral swelling, no pain.  SKIN: No new rashes or pruritus.  HEMATOLOGIC: As in HPI. No obvious bleeding issues.  NEUROLOGIC: No new focal weakness, seizures or loss of consciousness.  ENDOCRINE: No polyuria or polydipsia.   PHYSICAL EXAMINATION:  GENERAL: The patient is a moderately-built and well-nourished individual, mildly tachypneic on speaking, otherwise alert and oriented and in no acute distress at rest. Icteric. Pallor present.  VITAL SIGNS: Temperature 97.7, pulse 81, respirations 20, blood  pressure 130/80, saturating 94% on room air.  HEENT: Normocephalic, atraumatic. Extraocular movements intact. Sclerae icteric. No oral thrush.  NECK: Negative for  lymphadenopathy.  CARDIOVASCULAR: S1, S2, regular rate and rhythm.  PULMONARY: Bilateral diminished breath sounds overall. No rhonchi. A few bibasilar crackles.  ABDOMEN: Soft, nontender, spleen is palpable. Bowel sounds present.  EXTREMITIES: Shows bilateral 2+ edema, right slightly more than left. No cyanosis. No calf tenderness.  SKIN: Shows no generalized rashes or major bruising.  NEUROLOGIC: Grossly nonfocal, cranial nerves intact. Gait unremarkable.   LABORATORY RESULTS: WBC is 374,000, hemoglobin 5.5, MCV 127, platelet count 128,000, neutrophils 3%, lymphocytes 90%, nucleated red blood cell 400. Creatinine 1.27, BUN 22, potassium 3.5, calcium 8.5, glucose 183, bilirubin 6.0, direct bilirubin 0.5, alkaline phosphatase/AST/ALT normal, albumin 3.6. Troponin I and CK-MB unremarkable. On September 14th direct Coombs test positive, absolute reticulocyte elevated at 0.259.   IMPRESSION AND PLAN: 1.  Progressive dyspnea, lower extremity edema in setting of progressive hemolytic anemia - we will admit to the hospital given no relief in symptoms despite 2 Lasix tablets per day, we will start on IV diuresis with Lasix and potassium supplementation as indicated. We will get echocardiogram to evaluate for congestive heart failure and consult cardiologist, Dr. Saralyn Pilar, since he has seen him in the past. EKG and serial cardiac enzymes.  2.  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 Medication has been held. We will continue on IV steroids Solu-Medrol 80 mg q. 12 hours as inpatient and monitor. Will likely need blood transfusion if he is not in frank pulmonary edema. We will get chest x-ray to evaluate.  3.  Chronic lymphocytic leukemia - leukocytosis response could be an initial effect of ibrutinib. We will continue to monitor. We will likely start him on Rituxan low-dose for hemolytic anemia which should help control chronic lymphocytic  leukemia also.  4.  Continue other home medications same as before.   The patient and wife present, explained above, they are agreeable to this plan.    ____________________________ Rhett Bannister. Ma Hillock, MD srp:TT D: 12/07/2013 14:02:16 ET T: 12/07/2013 14:16:12 ET JOB#: [id number]  cc: Tekia Waterbury R. Ma Hillock, MD, <Dictator> Alveta Heimlich MD ELECTRONICALLY SIGNED 12/10/2013 17:10

## 2014-07-15 NOTE — Consult Note (Signed)
PATIENT NAME:  Dean Collins, Dean Collins MR#:  [id number] DATE OF BIRTH:  1940-05-24  DATE OF CONSULTATION:  12/08/2013  REFERRING PHYSICIAN:  Dr. Posey Pronto CONSULTING PHYSICIAN:  Dwayne D. Callwood, MD  INDICATION: Congestive heart failure and shortness of breath.   HISTORY OF PRESENT ILLNESS: The patient is a 74 year old white male  chronic lymphocytic leukemia, recently started on next-line therapy orally.  10 to 12 days ago. Over the last weekend he started having progressive sick feeling, developed jaundice, progressive weakness. He also had dyspnea with minimal exertion, sometimes at rest. No obvious wheezing. The patient denies any new cough, hemoptysis or sputum production. No fevers or chills. He also developed hemolytic anemia  No bleeding. He was seen in the middle of September and had a work-up for hemolytic anemia, started on prednisone. He got progressive dyspnea, leg swelling. Even on Lasix tablets, he is still having progressive swelling and shortness of breath so he came to the Emergency Room. No real chest pain but complains of  symptoms, so came to the ER for evaluation and subsequently was admitted.  PAST MEDICAL HISTORY: CLL, hyperlipidemia, diverticulosis, right middle lung infiltrate, environmental allergies, tubular adenomatous polyps, prostate cancer, osteoarthritis, urticaria, nephrolithiasis, sleep apnea, DJD of the hip.  PAST SURGICAL HISTORY: Colonoscopies, prostatectomy, hip replacement, cryoablation of a renal mass.  FAMILY HISTORY:   Essentially unremarkable.   SOCIAL HISTORY: Still works. Lives with his wife. No recent smoking or alcohol consumption.  ALLERGIES: None.  HOME MEDICATIONS: Aleve 2 tablets every 12 hours, allopurinol 100 mg daily, Dulera inhaler 2 puffs twice daily, Lasix 20 mg 2 tablets daily, ibrutinib 420 mg daily but has since stopped September 14th, levothyroxine 20 mcg daily, potassium chloride 10 mEq with the patient in the lateral decubitus position Lasix,  prednisone 80 mg daily, Rolaids as needed, sertraline 50 mg at bedtime, simvastatin 40 mg at bedtime, tramadol 50 mg q. 6 hours.  REVIEW OF SYSTEMS: No blackout spells or syncope. Denies nausea or vomiting. No fever,  no chills, no sweats. No weight loss or weight gain. No hemoptysis or hematemesis. No bright red blood per rectum. Complains of shortness of breath, weakness, and leg edema. Minimal cough with burping.  PHYSICAL EXAMINATION: VITAL SIGNS: Blood pressure 130/80, pulse 80 and regular, respiratory rate 16, afebrile. HEENT: Normocephalic, atraumatic. Pupils equal and reactive to light. NECK:  Supple. No significant JVD, bruits or adenopathy.  LUNGS: Bilateral rhonchi. No significant rales.   No wheezing.  HEART:  Regular rate and rhythm. Systolic ejection murmur at the left sternal border. Soft S3. PMI nondisplaced.  ABDOMEN: Benign.  EXTREMITIES: 2+ edema.  NEUROLOGIC: Intact.  SKIN: Normal.  DIAGNOSTIC DATA: White count 374,000, hemoglobin 5.5, MCV    platelet count 128,000, 3% neutrophils, 9% lymphocytes. Creatinine 1.27, BUN 23, potassium 3.5, calcium  LFTs are normal. Cardiac enzymes: Troponin and CK were normal.   IMPRESSION: Shortness of breath, dyspnea, possible congestive failure, edema, hemolytic anemia, chronic lymphocytic anemia, obesity, obstructive sleep apnea.   PLAN: Agree with admit. Treat anemia. Correct hemoglobin. Supplemental oxygen as necessary. Agree with Lasix and potassium for diuresis. Echocardiogram would be helpful.  Continue current therapy. For acute hemolytic anemia, as per hematology with steroid therapy. Continue overall treatment for chronic lymphocytic anemia as well. Continue to support overall hemoglobin to at least 8 if possible. Do not recommend cardiac catheterization. Do not recommend nitrate. Would consider adding ACE inhibitor depending on echocardiogram results. Would also continue diuresis. May use IV therapy in the interim. Continue to  treat  obstructive sleep apnea as well. We will see how the patient responds   ____________________________ Loran Senters. Clayborn Bigness, MD ddc:sb D: 12/09/2013 07:14:04 ET T: 12/09/2013 08:10:47 ET JOB#: [id number]  cc: Dwayne D. Clayborn Bigness, MD, <Dictator> Yolonda Kida MD ELECTRONICALLY SIGNED 01/06/2014 14:39

## 2014-07-15 NOTE — Discharge Summary (Signed)
PATIENT NAME:  Dean Collins, Dean Collins MR#:  [id number] DATE OF BIRTH:  1940-09-10  DATE OF ADMISSION:  12/07/2013 DATE OF DISCHARGE:  12/09/2013  DISCHARGE DIAGNOSES:  1.  Hemolytic anemia, lower extremity edema.  2.  Chronic lymphocytic leukemia.   HISTORY OF PRESENT ILLNESS: The patient is a 74 year old gentleman with known history of recurrent relapsed chronic lymphocytic leukemia recently started on therapy with ibrutinib. The patient took medication for about 10-12 days and started getting progressively sick and developed jaundice and progressive weakness. He also has progressive dyspnea on minimal exertion and sometimes at rest. He also has developed hemolytic anemia with hemoglobin less than 6 with no bleeding symptoms. Coombs test is positive. He was started on prednisone on September 14 but was admitted to the hospital on September 16 since he continued to have progressive symptoms, including worsening edema.   For past medical history, surgical history, family history, medications, allergies, review of systems, exam findings, refer to history and physical note for details.   HOSPITAL COURSE: Labs on admission showed WBC markedly elevated at 374,000, hemoglobin 5.5 with MCV 127, platelet count 128,000. Creatinine 1.27, potassium 3.5, bilirubin 6.0 with direct bilirubin 0.5 only. Direct Coombs test was positive on September 14 with reticulocyte absolute elevated at 0.259. The patient was admitted to the oncology floor and given more aggressive IV diuresis with Lasix b.i.d. and potassium supplement as indicated. Echocardiogram showed a good EF of 70% to 75%. The patient did not have any further symptoms of progressive dyspnea or orthopnea to suggest congestive heart failure. Cardiology evaluation was obtained. For hemolytic anemia, given severe symptomatic anemia, he was given 1 unit of packed red blood cell transfusion on September 16, hemoglobin on September 17 was better at 7.1 grams. He was then given  low-dose rituximab 100 mg total dose on September 17,  which he tolerated well. On September 18, hemoglobin was stable at 7 grams and clinically he was beginning to improve. Lower extremity edema was also beginning to improve. He was oxygenating well on room air. The patient was therefore discharged home with the same prednisone taper for hemolytic anemia, same home medications as before, and advised to follow up at [facility] on September 21 with repeat laboratories and make further plan of management. Ibrutinib has been stopped due to poor tolerance and progressive leukocytosis/lymphocytosis felt likely to be early ibrutinib effect.  DISCHARGE DIET: 2 grams sodium.   DISCHARGE MEDICATIONS: 1.  Prednisone taper as already advised as outpatient.  2.  Sertraline 50 mg once daily.  3.  Simvastatin 40 mg once daily.  4.  Levothyroxine 50 mcg once daily.  5.  Dulera 5/100 mcg inhaler 2 puffs b.i.d. p.r.n.  6.  Lasix 20 mg once daily p.r.n. for swelling.  7.  Potassium chloride 10 mEq, take concurrently when Lasix is taken.  8.  Allopurinol 100 mg daily.  9.  Tramadol 50 mg q. 6 hours p.r.n. for pain.  10.  Zolpidem 5 mg at bedtime p.r.n. for insomnia.   ACTIVITY: As tolerated.   FOLLOWUP: At [facility] on September 21 with laboratories.   ____________________________ Rhett Bannister Ma Hillock, MD srp:ST D: 12/10/2013 16:51:14 ET T: 12/10/2013 23:20:58 ET JOB#: [id number]  cc: Lou Loewe R. Ma Hillock, MD, <Dictator> Alveta Heimlich MD ELECTRONICALLY SIGNED 12/14/2013 15:18

## 2014-07-15 NOTE — Consult Note (Signed)
Chief Complaint:  Subjective/Chief Complaint Short of breath is much improved in leg swelling is improved he has more energy denies any chest pain status post recent chemo.   VITAL SIGNS/ANCILLARY NOTES: **Vital Signs.:   18-Sep-15 13:53  Vital Signs Type Routine  Temperature Temperature (F) 98.2  Celsius 36.7  Pulse Pulse 92  Respirations Respirations 19  Systolic BP Systolic BP 262  Diastolic BP (mmHg) Diastolic BP (mmHg) 44  Mean BP 69  Pulse Ox % Pulse Ox % 94  Pulse Ox Activity Level  At rest  Oxygen Delivery Room Air/ 21 %  *Intake and Output.:   18-Sep-15 07:30  Grand Totals Intake:  360 Output:      Net:  360 24 Hr.:  360  Oral Intake      In:  360  Percentage of Meal Eaten  100   Brief Assessment:  GEN well developed, well nourished, no acute distress   Cardiac Regular  murmur present  + LE edema  -- JVD   Respiratory normal resp effort  clear BS  rhonchi   Gastrointestinal Normal   Gastrointestinal details normal Soft  Nontender  Nondistended  No masses palpable   EXTR negative cyanosis/clubbing, positive edema   Lab Results: Routine Chem:  18-Sep-15 04:23   Result Comment HEMOGLOBIN - RESULTS VERIFIED BY REPEAT TESTING.  - REPEATED AND CONFIRMED BY DILUTION DIFFERENTIAL - DIFFERENTIAL FROM ALBUMIN SMEAR  - SLIDE PREVIOUSLY REVIEWED BY PATHOLOGIST  Result(s) reported on 09 Dec 2013 at 08:08AM.  Glucose, Serum  121  BUN  26  Creatinine (comp) 1.21  Sodium, Serum 142  Potassium, Serum 4.0  Chloride, Serum  108  CO2, Serum 25  Calcium (Total), Serum  8.2  Anion Gap 9  Osmolality (calc) 289  eGFR (African American) >60  eGFR (Non-African American)  59 (eGFR values <45m/min/1.73 m2 may be an indication of chronic kidney disease (CKD). Calculated eGFR is useful in patients with stable renal function. The eGFR calculation will not be reliable in acutely ill patients when serum creatinine is changing rapidly. It is not useful in  patients on  dialysis. The eGFR calculation may not be applicable to patients at the low and high extremes of body sizes, pregnant women, and vegetarians.)  Routine Hem:  18-Sep-15 04:23   WBC (CBC)  310.0  RBC (CBC)  1.93  Hemoglobin (CBC)  7.0  Hematocrit (CBC)  23.5  Platelet Count (CBC)  120  MCV  122  MCH  36.3  MCHC  29.8  RDW  22.1  Segmented Neutrophils 5  Lymphocytes 95  Diff Comment 1 ANISOCYTOSIS  Diff Comment 2 POIKILOCYTOSIS  Diff Comment 3 POLYCHROMASIA  Diff Comment 4 MICROCYTES PRESENT  Diff Comment 5 MACROCYTES PRESENT  Diff Comment 6 LARGE PLATELETS  Diff Comment 7 PLTS VARIED IN SIZE  Result(s) reported on 09 Dec 2013 at 08:08AM.   Radiology Results: XRay:    16-Sep-15 13:10, Chest PA and Lateral  Chest PA and Lateral   REASON FOR EXAM:    cough, dyspnea  COMMENTS:       PROCEDURE: DXR - DXR CHEST PA (OR AP) AND LATERAL  - Dec 07 2013  1:10PM     CLINICAL DATA:  History of CAD L now with 2-3 weeks of shortness of  breath. ; history of childhood asthma and previous tobacco use.    EXAM:  CHEST  2 VIEW    COMPARISON:  Report of a chest x-ray dated June 17, 2012  FINDINGS:  The lungs are reasonably well expanded. There is no focal  infiltrate. The interstitial markings are coarse but were reported  as mildly increased previously. There is no pneumothorax or  pneumomediastinum. There is a trace of blunting of the posterior  costophrenic angles bilaterally. The heart and pulmonary vascularity  are within the limits of normal. The bony thorax is unremarkable.     IMPRESSION:  1. There is no evidence of CHF nor alveolar pneumonia.  2. Minimal bibasilar atelectasis or scarring is suspected. A trace  of pleural fluid blunts the posterior costophrenic angles.      Electronically Signed    By: Kimball  Martinique    On: 12/07/2013 13:52     Verified By: Bonifacio A. [place], M.D., MD  Cardiology:    16-Sep-15 12:58, ECG  Ventricular Rate 84  Atrial Rate 84  P-R  Interval 184  QRS Duration 92  QT 384  QTc 453  P Axis 50  R Axis -2  T Axis 12  ECG interpretation   Normal sinus rhythm  Normal ECG  When compared with ECG of 05-Dec-2013 12:30,  No significant change was found  Confirmed by Glenetta Hew ([id number]) on 12/07/2013 5:07:41 PM    Overreader: Glenetta Hew  ECG     16-Sep-15 13:25, Echo Doppler  Echo Doppler   REASON FOR EXAM:      COMMENTS:       PROCEDURE: [place] - ECHO DOPPLER COMPLETE(TRANSTHOR)  - Dec 07 2013  1:25PM     RESULT: Echocardiogram Report    Patient Name:   Dean Collins Date of Exam: 12/07/2013  Medical Rec #:  [id number]        Custom1:  Date ofBirth:  01/10/1941     Height:       70.0 in  Patient Age:    74 years      Weight:       250.0 lb  Patient Gender: M             BSA:          2.29 m??    Indications: CHF  Sonographer:    Sherrie Sport RDCS  Referring Phys: Leia Alf, R    Summary:   1. Left ventricular ejection fraction, by visual estimation, is 65 to   70%.   2. Normal global left ventricular systolic function.   3. Moderately dilated left atrium.   4. Mild thickening of the anterior and posterior mitral valve leaflets.   5.Mild to moderate aortic valve sclerosis/calcification without any   evidence of aortic stenosis.   6. Moderately increased left ventricular posterior wall thickness.   7. Mild tricuspid regurgitation.  2D AND M-MODE MEASUREMENTS (normal ranges withinparentheses):  Left Ventricle:          Normal  IVSd (2D):      1.14 cm (0.7-1.1)  LVPWd (2D):     1.32 cm (0.7-1.1) Aorta/LA:                  Normal  LVIDd (2D):     4.30 cm (3.4-5.7) Aortic Root (2D): 3.30 cm (2.4-3.7)  LVIDs (2D):     2.69 cm      Left Atrium (2D): 5.20 cm (1.9-4.0)  LV FS (2D):     37.4 %   (>25%)  LV EF (2D):     67.8 %   (>50%)  Right Ventricle:                                    RVd (2D):        1.82 cm  LV DIASTOLIC FUNCTION:  MV Peak E: 0.97 m/s E/e' Ratio: 12.50  MV  Peak A: 0.72 m/s Decel Time: 253 msec  E/A Ratio: 1.36  SPECTRAL DOPPLER ANALYSIS (where applicable):  Mitral Valve:  MV P1/2 Time: 73.37 msec  MV Area, PHT: 3.00 cm??  Aortic Valve: AoV Max Vel: 1.23 m/s AoV Peak PG: 6.1 mmHg AoV Mean PG:  LVOT Vmax: 0.75 m/s LVOT VTI:  LVOT Diameter: 2.10 cm  AoV Area, Vmax: 2.11 cm?? AoV Area, VTI:  AoV Area, Vmn:  Tricuspid Valve and PA/RV Systolic Pressure: TR Max Velocity: 2.76 m/s RA   Pressure: 5 mmHg RVSP/PASP: 35.6 mmHg  Pulmonic Valve:  PV Max Velocity: 0.88 m/s PV Max PG: 3.1 mmHg PV Mean PG:    PHYSICIAN INTERPRETATION:  Left Ventricle: The left ventricular internal cavity size was normal. LV   septal wall thickness was normal. LV posterior wall thickness was   moderately increased. Global LV systolic function was normal. Left   ventricular ejection fraction, by visual estimation, is 65 to 70%.  Right Ventricle: The right ventricular size is normal. Global RV systolic   function is normal.  Left Atrium: The left atrium is moderately dilated.  Right Atrium: The right atrium is normal in size.  Pericardium: There is no evidence of pericardial effusion.  Mitral Valve: The mitral valve is normal in structure. There is mild   thickening of the anterior and posterior mitral valve leaflets. Trace   mitral valve regurgitation is seen.  Tricuspid Valve: The tricuspid valve is normal. Mild tricuspid   regurgitation is visualized. The tricuspid regurgitant velocity is 2.76   m/s, and with an assumed right atrialpressure of 5 mmHg, the estimated   right ventricular systolic pressure is normal at 35.6 mmHg.  Aortic Valve: The aortic valve is normal. Mild to moderate aortic valve   sclerosis/calcification is present, without any evidence of aortic   stenosis. No evidence of aortic valve regurgitation is seen.  Pulmonic Valve: The pulmonic valve is normal.  [place] MD  Electronically signed by [place] MD  Signature  Date/Time: 12/08/2013/9:10:06 AM    *** Final ***    IMPRESSION: .        Verified By: Yolonda Kida, M.D., MD   Assessment/Plan:  Assessment/Plan:  Assessment IMP  shortness of breath  congestive heart further  leg edema  acute hemolytic anemia  CLL  obesity  anemia .   Plan PLAN   agree with chemotherapy as per Hematology  continue replacing the blood for anemia  oxygen therapy p.r.n. for shortness of breath  continue Lasix therapy for congestion and edema  follow-up white count an blood work as per Hematology  recommend weight loss exercise portion control  appears to have evidence of diastolic dysfunction  increase activity  may be okay to go home soon  follow-up with Cardiology wanted 2 weeks after discharge   Electronic Signatures: Lujean Amel D (MD)  (Signed 18-Sep-15 15:53)  Authored: Chief Complaint, VITAL SIGNS/ANCILLARY NOTES, Brief Assessment, Lab Results, Radiology Results, Assessment/Plan   Last Updated: 18-Sep-15 15:53 by Yolonda Kida (MD)

## 2014-07-15 NOTE — Op Note (Signed)
PATIENT NAME:  Dean Collins, Dean Collins MR#:  [id number] DATE OF BIRTH:  November 15, 1940  DATE OF PROCEDURE:  01/30/2014  PREOPERATIVE DIAGNOSIS: Recurrent lymphoma, need for central venous access.   POSTOPERATIVE DIAGNOSIS: Recurrent lymphoma, need for central venous access.   OPERATIVE PROCEDURE: Left subclavian PowerPort placement with ultrasound and fluoroscopic guidance.   OPERATING SURGEON: Robert Bellow, MD  ANESTHESIA: Attended local, 10 mL of 1% plain Xylocaine.   ESTIMATED BLOOD LOSS: Minimal.   CLINICAL NOTE: This 74 year old male has had a recurrence of his lymphoma and requires central venous access for medication administration.   OPERATIVE NOTE: With the patient supine on the operating table, hair was removed with clippers. Kefzol was administered. The chest was prepped with ChloraPrep and draped. Ultrasound was used to confirm patency of the subclavian vein. This was cannulated under ultrasound guidance. The guidewire was placed followed by the dilator and the catheter. Under fluoroscopy, this was positioned in the distal SVC. It was tunneled to a pocket on the left anterior chest. The port was attached and flushed. It easily irrigated and aspirated in the supine position. The port was attached to the deep tissue with interrupted 3-0 Prolene sutures. The wound was closed in layers with 3-0 Vicryl to the adipose tissue and a running 4-0 Vicryl subcuticular suture for the skin. Benzoin, Steri-Strips, Telfa and Tegaderm dressing was then applied.   The patient tolerated the procedure well and was taken to the recovery room in stable condition.    ____________________________ Robert Bellow, MD jwb:TT D: 01/30/2014 15:32:36 ET T: 01/30/2014 17:12:59 ET JOB#: [id number]  cc: Robert Bellow, MD, <Dictator> Sandeep R. Ma Hillock, MD Leonie Douglas Doy Hutching, MD [place] Amedeo Kinsman MD ELECTRONICALLY SIGNED 01/30/2014 20:13

## 2014-08-11 ENCOUNTER — Inpatient Hospital Stay: Payer: Medicare Other

## 2014-08-11 ENCOUNTER — Inpatient Hospital Stay
Admission: AD | Admit: 2014-08-11 | Discharge: 2014-08-15 | DRG: 470 | Disposition: A | Discharge status: Skilled Nursing Facility | Payer: Medicare Other | Source: Ambulatory Visit | Attending: Orthopedic Surgery | Admitting: Orthopedic Surgery

## 2014-08-11 DIAGNOSIS — R32 Unspecified urinary incontinence: Secondary | ICD-10-CM | POA: Diagnosis present

## 2014-08-11 DIAGNOSIS — R0602 Shortness of breath: Secondary | ICD-10-CM

## 2014-08-11 DIAGNOSIS — J069 Acute upper respiratory infection, unspecified: Secondary | ICD-10-CM | POA: Diagnosis present

## 2014-08-11 DIAGNOSIS — C9110 Chronic lymphocytic leukemia of B-cell type not having achieved remission: Secondary | ICD-10-CM | POA: Diagnosis present

## 2014-08-11 DIAGNOSIS — I10 Essential (primary) hypertension: Secondary | ICD-10-CM | POA: Diagnosis present

## 2014-08-11 DIAGNOSIS — E039 Hypothyroidism, unspecified: Secondary | ICD-10-CM | POA: Diagnosis present

## 2014-08-11 DIAGNOSIS — Z96653 Presence of artificial knee joint, bilateral: Secondary | ICD-10-CM | POA: Diagnosis present

## 2014-08-11 DIAGNOSIS — E119 Type 2 diabetes mellitus without complications: Secondary | ICD-10-CM | POA: Diagnosis present

## 2014-08-11 DIAGNOSIS — K5792 Diverticulitis of intestine, part unspecified, without perforation or abscess without bleeding: Secondary | ICD-10-CM | POA: Diagnosis present

## 2014-08-11 DIAGNOSIS — M1611 Unilateral primary osteoarthritis, right hip: Secondary | ICD-10-CM | POA: Diagnosis present

## 2014-08-11 DIAGNOSIS — Z96649 Presence of unspecified artificial hip joint: Secondary | ICD-10-CM

## 2014-08-11 DIAGNOSIS — D696 Thrombocytopenia, unspecified: Secondary | ICD-10-CM | POA: Diagnosis present

## 2014-08-11 DIAGNOSIS — Z85820 Personal history of malignant melanoma of skin: Secondary | ICD-10-CM | POA: Diagnosis not present

## 2014-08-11 DIAGNOSIS — S72001A Fracture of unspecified part of neck of right femur, initial encounter for closed fracture: Secondary | ICD-10-CM | POA: Diagnosis present

## 2014-08-11 DIAGNOSIS — F419 Anxiety disorder, unspecified: Secondary | ICD-10-CM | POA: Diagnosis present

## 2014-08-11 DIAGNOSIS — Z85038 Personal history of other malignant neoplasm of large intestine: Secondary | ICD-10-CM

## 2014-08-11 DIAGNOSIS — S72009A Fracture of unspecified part of neck of unspecified femur, initial encounter for closed fracture: Secondary | ICD-10-CM | POA: Diagnosis present

## 2014-08-11 DIAGNOSIS — Z01818 Encounter for other preprocedural examination: Secondary | ICD-10-CM | POA: Diagnosis not present

## 2014-08-11 DIAGNOSIS — Z87891 Personal history of nicotine dependence: Secondary | ICD-10-CM

## 2014-08-11 DIAGNOSIS — I159 Secondary hypertension, unspecified: Secondary | ICD-10-CM

## 2014-08-11 HISTORY — DX: Hypothyroidism, unspecified: E03.9

## 2014-08-11 HISTORY — DX: Anxiety disorder, unspecified: F41.9

## 2014-08-11 LAB — BASIC METABOLIC PANEL
ANION GAP: 6 (ref 5–15)
BUN: 19 mg/dL (ref 6–20)
CALCIUM: 8.6 mg/dL — AB (ref 8.9–10.3)
CHLORIDE: 106 mmol/L (ref 101–111)
CO2: 28 mmol/L (ref 22–32)
Creatinine, Ser: 1.03 mg/dL (ref 0.61–1.24)
GFR calc Af Amer: >60 mL/min (ref >60)
GFR calc non Af Amer: >60 mL/min (ref >60)
Glucose, Bld: 189 mg/dL — ABNORMAL HIGH (ref 65–99)
POTASSIUM: 4.2 mmol/L (ref 3.5–5.1)
SODIUM: 140 mmol/L (ref 135–145)

## 2014-08-11 LAB — CBC WITH DIFFERENTIAL/PLATELET
BASOS PCT: 1 %
Basophils Absolute: 0.3 10*3/uL — ABNORMAL HIGH (ref 0–0.1)
EOS PCT: 1 %
Eosinophils Absolute: 0.3 10*3/uL (ref 0–0.7)
HEMATOCRIT: 38.7 % — AB (ref 40.0–52.0)
Hemoglobin: 12.7 g/dL — ABNORMAL LOW (ref 13.0–18.0)
LYMPHS ABS: 21.6 10*3/uL — AB (ref 1.0–3.6)
Lymphocytes Relative: 87 %
MCH: 31.2 pg (ref 26.0–34.0)
MCHC: 32.8 g/dL (ref 32.0–36.0)
MCV: 95.2 fL (ref 80.0–100.0)
MONO ABS: 0.8 10*3/uL (ref 0.2–1.0)
MONOS PCT: 3 %
NEUTROS ABS: 2 10*3/uL (ref 1.4–6.5)
Neutrophils Relative %: 8 %
Platelets: 77 10*3/uL — ABNORMAL LOW (ref 150–440)
RBC: 4.06 MIL/uL — AB (ref 4.40–5.90)
RDW: 15.1 % — ABNORMAL HIGH (ref 11.5–14.5)
Smear Review: DECREASED
WBC: 25 10*3/uL — AB (ref 3.8–10.6)

## 2014-08-11 LAB — PROTIME-INR
INR: 1.01
Prothrombin Time: 13.5 seconds (ref 11.4–15.0)

## 2014-08-11 MED ORDER — SODIUM CHLORIDE 0.9 % IV SOLN
INTRAVENOUS | Status: DC
  Administered 2014-08-11: 15:00:00 via INTRAVENOUS

## 2014-08-11 MED ORDER — SALINE SPRAY 0.65 % NA SOLN
1.0000 | NASAL | Status: DC | PRN
  Filled 2014-08-11: qty 44

## 2014-08-11 MED ORDER — GUAIFENESIN ER 600 MG PO TB12
600.0000 mg | ORAL_TABLET | Freq: Two times a day (BID) | ORAL | Status: DC
  Administered 2014-08-11 – 2014-08-15 (×7): 600 mg via ORAL
  Filled 2014-08-11 (×8): qty 1

## 2014-08-11 MED ORDER — ZOLPIDEM TARTRATE 5 MG PO TABS: 5.0000 mg | ORAL_TABLET | Freq: Every evening | ORAL | Status: DC | PRN

## 2014-08-11 MED ORDER — SODIUM CHLORIDE 0.9 % IJ SOLN: 10.0000 mL | INTRAMUSCULAR | Status: DC | PRN

## 2014-08-11 MED ORDER — HYDROCODONE-ACETAMINOPHEN 5-325 MG PO TABS
1.0000 | ORAL_TABLET | Freq: Four times a day (QID) | ORAL | Status: DC | PRN
  Administered 2014-08-11: 1 via ORAL
  Filled 2014-08-11: qty 2

## 2014-08-11 MED ORDER — SODIUM CHLORIDE 0.9 % IJ SOLN
10.0000 mL | Freq: Two times a day (BID) | INTRAMUSCULAR | Status: DC
  Administered 2014-08-11: 10 mL via INTRAVENOUS

## 2014-08-11 MED ORDER — MORPHINE SULFATE 2 MG/ML IJ SOLN
0.5000 mg | INTRAMUSCULAR | Status: DC | PRN
  Administered 2014-08-11 – 2014-08-12 (×7): 0.5 mg via INTRAVENOUS
  Filled 2014-08-11 (×7): qty 1

## 2014-08-11 MED ORDER — AMOXICILLIN-POT CLAVULANATE 875-125 MG PO TABS
1.0000 | ORAL_TABLET | Freq: Two times a day (BID) | ORAL | Status: DC
  Administered 2014-08-11 – 2014-08-15 (×7): 1 via ORAL
  Filled 2014-08-11 (×7): qty 1

## 2014-08-11 MED ORDER — DOCUSATE SODIUM 100 MG PO CAPS
100.0000 mg | ORAL_CAPSULE | Freq: Two times a day (BID) | ORAL | Status: DC
  Administered 2014-08-11 (×2): 100 mg via ORAL
  Filled 2014-08-11 (×2): qty 1

## 2014-08-11 NOTE — Progress Notes (Addendum)
Direct admit for right hip femoral neck fracture. Pt to have surgery with Dr. Rudene Christians tomorrow afternoon. Consents signed. Foley in place. 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 VSS at this time.

## 2014-08-11 NOTE — Consult Note (Signed)
[facility]   PATIENT NAME: Dean Collins    MR#:  [id number]  DATE OF BIRTH:  02-25-1941  DATE OF ADMISSION:  08/11/2014  PRIMARY CARE PHYSICIAN: SPARKS,JEFFREY D, MD   REQUESTING/REFERRING PHYSICIAN: Dr. Rudene Christians  CHIEF COMPLAINT:   Congestion, medical clearance prior to surgery  HISTORY OF PRESENT ILLNESS:  Dean Collins  is a 74 y.o. male with a known history of CLL, hypertension, hypothyroidism, colon cancer in remission, coronary artery disease and diabetes who follows with Dr. Doy Hutching presented today to orthopedics for evaluation of right hip pain after fall. He was found to have severe osteoarthritis of the right hip as well as a new stress fracture. He is being admitted by Dr. Marry Guan for total right hip arthroplasty. Medicine service has been consulted for medical clearance as well as further evaluation of ingestion and persistent cough. He initially presented to primary care on April 28 with this complaint and has been treated with a course of Levaquin, steroid taper and antihistamines. Chest x-ray at that time was negative for pneumonia. He initially had good response to prednisone. For the past week or so he has had persistent coughing productive of small amounts of green sputum. He continues to feel facial congestion particularly above and behind the eyes. He does have sensation of postnasal drip and drainage. He has not had any fevers or chills for several weeks. No nausea vomiting Reiger's or weakness. He does report some wheezing but states this is been going on for several months. He reports shortness of breath with exertion is stable.  No chest pain or syncope   PAST MEDICAL HISTORY:   Past Medical History  Diagnosis Date  . Incontinence   . Arthritis   . Cancer     CLL prostate  . Melanoma     scalp  . Diverticulitis   . CLL (chronic lymphocytic leukemia) 2003  . Hypothyroidism   . Anxiety     PAST  SURGICAL HISTORY:   Past Surgical History  Procedure Laterality Date  . Replacement total knee      x2  . Prostatectomy  1993  . Colonoscopy  2013-2014?    SOCIAL HISTORY:   History  Substance Use Topics  . Smoking status: Former Smoker    Quit date: 03/24/1990  . Smokeless tobacco: Never Used  . Alcohol Use: 0.0 oz/week    0 Standard drinks or equivalent per week     Comment: occasionally    FAMILY HISTORY:   Family History  Problem Relation Age of Onset  . Heart attack Mother   . Heart attack Father     DRUG ALLERGIES:  No Known Allergies  REVIEW OF SYSTEMS:  CONSTITUTIONAL: No fever, fatigue or weakness.  EYES: No blurred or double vision.  EARS, NOSE, AND THROAT: No tinnitus or ear pain.  RESPIRATORY:Positive as above for cough, sputum, no hemoptysis, positive for wheezing, shortness of breath with exertion  CARDIOVASCULAR: No chest pain, orthopnea, edema.  GASTROINTESTINAL: No nausea, vomiting, diarrhea or abdominal pain.  GENITOURINARY: No dysuria, hematuria.  ENDOCRINE: No polyuria, nocturia,  HEMATOLOGY: No anemia, easy bruising or bleeding SKIN: No rash or lesion. MUSCULOSKELETAL: Pain in the right hip after fall, right lateral knee pain  NEUROLOGIC: No tingling, numbness, weakness.  PSYCHIATRY: No anxiety or depression.   MEDICATIONS AT HOME:   Prior to Admission medications   Medication Sig Start Date End Date Taking? Authorizing Provider  levothyroxine (SYNTHROID, LEVOTHROID)  75 MCG tablet Take 75 mcg by mouth daily before breakfast.   Yes Historical Provider, MD  sertraline (ZOLOFT) 50 MG tablet Take 50 mg by mouth at bedtime.  09/28/13  Yes Historical Provider, MD  simvastatin (ZOCOR) 40 MG tablet Take 40 mg by mouth daily at 6 PM.  08/20/13  Yes Historical Provider, MD  levothyroxine (SYNTHROID, LEVOTHROID) 50 MCG tablet Take 50 mcg by mouth daily before breakfast.    Historical Provider, MD      VITAL SIGNS:  Blood pressure 123/67, pulse 90,  temperature 98.2 F (36.8 C), temperature source Oral, height 5\' 9"  (1.753 m), weight 106.142 kg (234 lb), SpO2 93 %.  PHYSICAL EXAMINATION:  GENERAL:  74 y.o.-year-old patient lying in the bed with no acute distress. He is uncomfortable and shifting in the bed EYES: Pupils equal, round, reactive to light and accommodation. No scleral icterus. Extraocular muscles intact.  HEENT: Head atraumatic, normocephalic. Oropharynx and nasopharynx clear.  NECK:  Supple, no jugular venous distention. No thyroid enlargement, no tenderness.  LUNGS: Normal breath sounds bilaterally, no wheezing, rales,rhonchi or crepitation. No use of accessory muscles of respiration.  CARDIOVASCULAR: S1, S2 normal. No murmurs, rubs, or gallops.  ABDOMEN: Soft, nontender, nondistended. Bowel sounds present. No organomegaly or mass.  EXTREMITIES: No pedal edema, cyanosis, or clubbing.  NEUROLOGIC: Cranial nerves II through XII are intact. Muscle strength tested. Sensation intact. Gait not checked.  PSYCHIATRIC: The patient is alert and oriented x 3.  SKIN: No obvious rash, lesion, or ulcer.   LABORATORY PANEL:   CBC  Recent Labs Lab 08/11/14 1353  WBC 25.0*  HGB 12.7*  HCT 38.7*  PLT 77*   ------------------------------------------------------------------------------------------------------------------  Chemistries   Recent Labs Lab 08/11/14 1353  NA 140  K 4.2  CL 106  CO2 28  GLUCOSE 189*  BUN 19  CREATININE 1.03  CALCIUM 8.6*   ------------------------------------------------------------------------------------------------------------------  Cardiac Enzymes No results for input(s): TROPONINI in the last 168 hours. ------------------------------------------------------------------------------------------------------------------  RADIOLOGY:  Chest Portable 1 View  08/11/2014   CLINICAL DATA:  No history given.  EXAM: PORTABLE CHEST - 1 VIEW  COMPARISON:  January 30, 2014.  FINDINGS: The heart  size and mediastinal contours are within normal limits. Both lungs are clear. No pneumothorax or pleural effusion is noted. Left subclavian Port-A-Cath is noted with distal tip overlying expected position of SVC. The visualized skeletal structures are unremarkable.  IMPRESSION: No acute cardiopulmonary abnormality seen.   Electronically Signed   By: Marijo Conception, M.D.   On: 08/11/2014 14:37    EKG:   Orders placed or performed during the hospital encounter of 08/11/14  . EKG 12-Lead  . EKG 12-Lead    IMPRESSION AND PLAN:   #1 chronic cough and congestion: Chest x-ray today is negative for pneumonia. No hypoxia or tachypnea. Symptoms most consistent with sinus infection. Has completed a course of Levaquin. I would switch to Augmentin and start nasal saline spray when necessary. Continue with Mucinex and hydration.  #2 preop clearance: EKG today. Surgical repair of hip is important to preserve mobility autotomy and general health. If EKG is normal this patient would be cleared for surgery.  #3 CLL: Follows with Dr. Ma Hillock. White blood cell count today is actually lower than on prior labs 1 month ago was 50.7. He will need to continue to follow with oncology. His chemotherapy regimen has recently been reduced to every 8 week infusions.  #4 diabetes mellitus: Check hemoglobin A1c and start sliding scale insulin as  he is hyperglycemic on admission  #5 thrombocytopenia and normocytic anemia: Due to CLL, platelet count is stable. Hemoglobin stable. We will need to monitor closely after surgery for postoperative bleeding  Management plans discussed with the patient, family and they are in agreement.  CODE STATUS: Full   TOTAL TIME TAKING CARE OF THIS PATIENT: 45 minutes.   Note that this patient's primary care physician is Dr. Doy Hutching. The on call physician for [facility] clinics will be notified and will follow this patient in the morning  Myrtis Ser M.D on 08/11/2014 at 3:12  PM  Between 7am to 6pm - Pager - [phone]  After 6pm go to [email or web address] - password [email or web address] [facility]  Office  [phone]  CC: Primary care physician; Idelle Crouch, MD

## 2014-08-11 NOTE — Progress Notes (Signed)
Patient was seen in office today after a fall 3 days ago. He has a history of severe osteoarthritis in the right hip that was unable to bear weight after this fall and has been having a great of difficulty getting in and out of bed at home. He was found to have a incomplete femoral neck fracture on the right side consistent with his symptoms. If medically stable and he has been having some URI problems recently.Will  perform anterior direct anterior total hip tomorrow for femoral neck fracture with underlying severe osteoarthritis.

## 2014-08-11 NOTE — Progress Notes (Signed)
Pt complained of pain and burning with 40f foley. Rochel Brome changed to 48f per patient request.

## 2014-08-11 NOTE — Progress Notes (Signed)
Pt has a port a cath. MD gave order to access and use

## 2014-08-12 ENCOUNTER — Encounter
Admission: AD | Disposition: A | Discharge status: Skilled Nursing Facility | Payer: Self-pay | Source: Ambulatory Visit | Attending: Orthopedic Surgery

## 2014-08-12 ENCOUNTER — Inpatient Hospital Stay: Payer: Medicare Other | Admitting: Anesthesiology

## 2014-08-12 ENCOUNTER — Inpatient Hospital Stay: Payer: Medicare Other

## 2014-08-12 HISTORY — PX: TOTAL HIP ARTHROPLASTY: SHX124

## 2014-08-12 SURGERY — ARTHROPLASTY, HIP, TOTAL, ANTERIOR APPROACH
Anesthesia: General | Laterality: Right

## 2014-08-12 MED ORDER — MAGNESIUM CITRATE PO SOLN
1.0000 | Freq: Once | ORAL | Status: AC | PRN
  Filled 2014-08-12: qty 296

## 2014-08-12 MED ORDER — EPHEDRINE SULFATE 50 MG/ML IJ SOLN
INTRAMUSCULAR | Status: DC | PRN
  Administered 2014-08-12: 25 mg via INTRAVENOUS
  Administered 2014-08-12: 10 mg via INTRAVENOUS

## 2014-08-12 MED ORDER — BUPIVACAINE-EPINEPHRINE 0.25% -1:200000 IJ SOLN
INTRAMUSCULAR | Status: DC | PRN
  Administered 2014-08-12: 30 mL

## 2014-08-12 MED ORDER — ACETAMINOPHEN 650 MG RE SUPP
650.0000 mg | Freq: Four times a day (QID) | RECTAL | Status: DC | PRN
Start: 2014-08-12 — End: 2014-08-15
  Filled 2014-08-12: qty 1

## 2014-08-12 MED ORDER — FENTANYL CITRATE (PF) 100 MCG/2ML IJ SOLN
INTRAMUSCULAR | Status: AC
  Filled 2014-08-12: qty 2

## 2014-08-12 MED ORDER — ACETAMINOPHEN 500 MG PO TABS
1000.0000 mg | ORAL_TABLET | Freq: Four times a day (QID) | ORAL | Status: AC
  Administered 2014-08-12 – 2014-08-13 (×4): 1000 mg via ORAL
  Filled 2014-08-12 (×4): qty 2

## 2014-08-12 MED ORDER — DEXAMETHASONE SODIUM PHOSPHATE 4 MG/ML IJ SOLN
INTRAMUSCULAR | Status: DC | PRN
  Administered 2014-08-12: 4 mg via INTRAVENOUS

## 2014-08-12 MED ORDER — ONDANSETRON HCL 4 MG/2ML IJ SOLN: 4.0000 mg | Freq: Once | INTRAMUSCULAR | Status: DC | PRN

## 2014-08-12 MED ORDER — ACETAMINOPHEN 325 MG PO TABS
650.0000 mg | ORAL_TABLET | Freq: Four times a day (QID) | ORAL | Status: DC | PRN
  Administered 2014-08-14: 650 mg via ORAL
  Filled 2014-08-12: qty 2

## 2014-08-12 MED ORDER — ASPIRIN EC 325 MG PO TBEC
325.0000 mg | DELAYED_RELEASE_TABLET | Freq: Every day | ORAL | Status: DC
  Administered 2014-08-13 – 2014-08-15 (×3): 325 mg via ORAL
  Filled 2014-08-12 (×3): qty 1

## 2014-08-12 MED ORDER — ONDANSETRON HCL 4 MG PO TABS: 4.0000 mg | ORAL_TABLET | Freq: Four times a day (QID) | ORAL | Status: DC | PRN

## 2014-08-12 MED ORDER — MIDAZOLAM HCL 2 MG/2ML IJ SOLN
INTRAMUSCULAR | Status: DC | PRN
  Administered 2014-08-12: 2 mg via INTRAVENOUS

## 2014-08-12 MED ORDER — LACTATED RINGERS IV SOLN
INTRAVENOUS | Status: DC | PRN
  Administered 2014-08-12 (×2): via INTRAVENOUS

## 2014-08-12 MED ORDER — ONDANSETRON HCL 4 MG/2ML IJ SOLN
INTRAMUSCULAR | Status: DC | PRN
  Administered 2014-08-12: 4 mg via INTRAVENOUS

## 2014-08-12 MED ORDER — NEOMYCIN-POLYMYXIN B GU 40-200000 IR SOLN
Status: DC | PRN
  Administered 2014-08-12: 4 mL

## 2014-08-12 MED ORDER — MORPHINE SULFATE 2 MG/ML IJ SOLN
2.0000 mg | INTRAMUSCULAR | Status: DC | PRN
  Administered 2014-08-12 – 2014-08-14 (×3): 2 mg via INTRAVENOUS
  Filled 2014-08-12 (×3): qty 1

## 2014-08-12 MED ORDER — PROPOFOL 10 MG/ML IV BOLUS
INTRAVENOUS | Status: DC | PRN
  Administered 2014-08-12: 150 mg via INTRAVENOUS

## 2014-08-12 MED ORDER — FENTANYL CITRATE (PF) 100 MCG/2ML IJ SOLN
25.0000 ug | INTRAMUSCULAR | Status: DC | PRN
  Administered 2014-08-12 (×2): 25 ug via INTRAVENOUS

## 2014-08-12 MED ORDER — MENTHOL 3 MG MT LOZG
1.0000 | LOZENGE | OROMUCOSAL | Status: DC | PRN
  Filled 2014-08-12: qty 9

## 2014-08-12 MED ORDER — PHENYLEPHRINE HCL 10 MG/ML IJ SOLN
INTRAMUSCULAR | Status: DC | PRN
  Administered 2014-08-12 (×5): 200 ug via INTRAVENOUS

## 2014-08-12 MED ORDER — FENTANYL CITRATE (PF) 100 MCG/2ML IJ SOLN
INTRAMUSCULAR | Status: DC | PRN
  Administered 2014-08-12 (×2): 50 ug via INTRAVENOUS
  Administered 2014-08-12: 100 ug via INTRAVENOUS
  Administered 2014-08-12: 50 ug via INTRAVENOUS

## 2014-08-12 MED ORDER — NEOMYCIN-POLYMYXIN B GU 40-200000 IR SOLN
Status: AC
  Filled 2014-08-12: qty 4

## 2014-08-12 MED ORDER — CEFAZOLIN SODIUM-DEXTROSE 2-3 GM-% IV SOLR
INTRAVENOUS | Status: DC | PRN
  Administered 2014-08-12: 2 g via INTRAVENOUS

## 2014-08-12 MED ORDER — SODIUM CHLORIDE 0.9 % IV SOLN
INTRAVENOUS | Status: DC
  Administered 2014-08-12 – 2014-08-13 (×2): via INTRAVENOUS

## 2014-08-12 MED ORDER — SIMVASTATIN 40 MG PO TABS
40.0000 mg | ORAL_TABLET | Freq: Every day | ORAL | Status: DC
  Administered 2014-08-12 – 2014-08-14 (×3): 40 mg via ORAL
  Filled 2014-08-12 (×3): qty 1

## 2014-08-12 MED ORDER — DIPHENHYDRAMINE HCL 12.5 MG/5ML PO ELIX: 12.5000 mg | ORAL_SOLUTION | ORAL | Status: DC | PRN

## 2014-08-12 MED ORDER — SERTRALINE HCL 50 MG PO TABS
50.0000 mg | ORAL_TABLET | Freq: Every day | ORAL | Status: DC
  Administered 2014-08-12 – 2014-08-14 (×3): 50 mg via ORAL
  Filled 2014-08-12 (×4): qty 1

## 2014-08-12 MED ORDER — ALUM & MAG HYDROXIDE-SIMETH 200-200-20 MG/5ML PO SUSP: 30.0000 mL | ORAL | Status: DC | PRN

## 2014-08-12 MED ORDER — LEVOTHYROXINE SODIUM 75 MCG PO TABS
75.0000 ug | ORAL_TABLET | Freq: Every day | ORAL | Status: DC
  Administered 2014-08-13 – 2014-08-15 (×3): 75 ug via ORAL
  Filled 2014-08-12 (×3): qty 1

## 2014-08-12 MED ORDER — BISACODYL 10 MG RE SUPP
10.0000 mg | Freq: Every day | RECTAL | Status: DC | PRN
  Administered 2014-08-14: 10 mg via RECTAL
  Filled 2014-08-12: qty 1

## 2014-08-12 MED ORDER — PHENOL 1.4 % MT LIQD
1.0000 | OROMUCOSAL | Status: DC | PRN
  Filled 2014-08-12: qty 177

## 2014-08-12 MED ORDER — OXYCODONE HCL 5 MG PO TABS
5.0000 mg | ORAL_TABLET | ORAL | Status: DC | PRN
  Administered 2014-08-12: 5 mg via ORAL
  Administered 2014-08-13 – 2014-08-15 (×9): 10 mg via ORAL
  Filled 2014-08-12 (×8): qty 2
  Filled 2014-08-12: qty 1
  Filled 2014-08-12: qty 2

## 2014-08-12 MED ORDER — BUPIVACAINE-EPINEPHRINE (PF) 0.25% -1:200000 IJ SOLN
INTRAMUSCULAR | Status: AC
  Filled 2014-08-12: qty 30

## 2014-08-12 MED ORDER — LIDOCAINE HCL (CARDIAC) 20 MG/ML IV SOLN
INTRAVENOUS | Status: DC | PRN
  Administered 2014-08-12: 60 mg via INTRAVENOUS

## 2014-08-12 MED ORDER — KETAMINE HCL 50 MG/ML IJ SOLN
INTRAMUSCULAR | Status: DC | PRN
  Administered 2014-08-12 (×2): 50 mg via INTRAMUSCULAR

## 2014-08-12 MED ORDER — ONDANSETRON HCL 4 MG/2ML IJ SOLN: 4.0000 mg | Freq: Four times a day (QID) | INTRAMUSCULAR | Status: DC | PRN

## 2014-08-12 MED ORDER — CEFAZOLIN SODIUM-DEXTROSE 2-3 GM-% IV SOLR
2.0000 g | Freq: Three times a day (TID) | INTRAVENOUS | Status: AC
  Administered 2014-08-12 – 2014-08-13 (×3): 2 g via INTRAVENOUS
  Filled 2014-08-12 (×4): qty 50

## 2014-08-12 MED ORDER — MAGNESIUM HYDROXIDE 400 MG/5ML PO SUSP
30.0000 mL | Freq: Every day | ORAL | Status: DC | PRN
  Administered 2014-08-13: 30 mL via ORAL
  Filled 2014-08-12: qty 30

## 2014-08-12 MED ORDER — DOCUSATE SODIUM 100 MG PO CAPS
100.0000 mg | ORAL_CAPSULE | Freq: Two times a day (BID) | ORAL | Status: DC
  Administered 2014-08-12 – 2014-08-15 (×6): 100 mg via ORAL
  Filled 2014-08-12 (×7): qty 1

## 2014-08-12 SURGICAL SUPPLY — 47 items
BLADE SAW 1/2 (BLADE) ×3 IMPLANT
BNDG COHESIVE 6X5 TAN STRL LF (GAUZE/BANDAGES/DRESSINGS) ×3 IMPLANT
CANISTER SUCT 1200ML W/VALVE (MISCELLANEOUS) ×3 IMPLANT
CANISTER SUCT 3000ML (MISCELLANEOUS) IMPLANT
CAPT HIP TOTAL 3 ×3 IMPLANT
CATH FOL LEG HOLDER (MISCELLANEOUS) IMPLANT
CATH TRAY 16F METER LATEX (MISCELLANEOUS) IMPLANT
CHLORAPREP W/TINT 26ML (MISCELLANEOUS) ×3 IMPLANT
DRAPE C-ARM XRAY 36X54 (DRAPES) ×3 IMPLANT
DRAPE INCISE IOBAN 66X60 STRL (DRAPES) ×3 IMPLANT
DRAPE POUCH INSTRU U-SHP 10X18 (DRAPES) ×3 IMPLANT
DRAPE SHEET LG 3/4 BI-LAMINATE (DRAPES) ×9 IMPLANT
DRAPE TABLE BACK 80X90 (DRAPES) ×3 IMPLANT
ELECT BLADE 6.5 EXT (BLADE) ×3 IMPLANT
GAUZE SPONGE 4X4 12PLY STRL (GAUZE/BANDAGES/DRESSINGS) ×3 IMPLANT
GAUZE XEROFORM 4X4 STRL (GAUZE/BANDAGES/DRESSINGS) ×3 IMPLANT
GLOVE BIOGEL M 6.5 STRL (GLOVE) ×3 IMPLANT
GLOVE BIOGEL PI IND STRL 9 (GLOVE) ×1 IMPLANT
GLOVE BIOGEL PI INDICATOR 9 (GLOVE) ×2
GLOVE INDICATOR 7.0 STRL GRN (GLOVE) ×3 IMPLANT
GLOVE SURG ORTHO 9.0 STRL STRW (GLOVE) ×9 IMPLANT
GOWN SPECIALTY ULTRA XL (MISCELLANEOUS) ×3 IMPLANT
GOWN STRL REUS W/ TWL LRG LVL3 (GOWN DISPOSABLE) ×1 IMPLANT
GOWN STRL REUS W/TWL LRG LVL3 (GOWN DISPOSABLE) ×2
HEMOVAC 400CC 10FR (MISCELLANEOUS) IMPLANT
HOOD PEEL AWAY FACE SHEILD DIS (HOOD) ×3 IMPLANT
MAT BLUE FLOOR 46X72 FLO (MISCELLANEOUS) ×3 IMPLANT
NDL SAFETY 18GX1.5 (NEEDLE) ×3 IMPLANT
NEEDLE SPNL 18GX3.5 QUINCKE PK (NEEDLE) ×3 IMPLANT
NS IRRIG 1000ML POUR BTL (IV SOLUTION) ×3 IMPLANT
PACK HIP COMPR (MISCELLANEOUS) ×3 IMPLANT
SLEEVE CABLE 2MM VT (Orthopedic Implant) IMPLANT
STAPLER SKIN PROX 35W (STAPLE) ×3 IMPLANT
STRAP SAFETY BODY (MISCELLANEOUS) ×3 IMPLANT
SUT DVC 2 QUILL PDO  T11 36X36 (SUTURE) ×2
SUT DVC 2 QUILL PDO T11 36X36 (SUTURE) ×1 IMPLANT
SUT DVC QUILL MONODERM 30X30 (SUTURE) ×3 IMPLANT
SUT ETHIBOND NAB CT1 #1 30IN (SUTURE) ×3 IMPLANT
SUT SILK 0 (SUTURE) ×2
SUT SILK 0 30XBRD TIE 6 (SUTURE) ×1 IMPLANT
SUT VIC AB 1 CT1 36 (SUTURE) ×3 IMPLANT
SYR 20CC LL (SYRINGE) ×3 IMPLANT
SYR 30ML LL (SYRINGE) ×3 IMPLANT
TAPE MICROFOAM 4IN (TAPE) ×3 IMPLANT
TUBE KAMVAC SUCTION (TUBING) ×3 IMPLANT
WATER STERILE IRR 1000ML POUR (IV SOLUTION) IMPLANT
WAVEGUIDE EIGR WIDE/ANG 102521 (MISCELLANEOUS) IMPLANT

## 2014-08-12 NOTE — Progress Notes (Signed)
RETURNED FROM PACU. S/P RIGHT ANTERIOR HIP. FOAM DRESSING INTACT WITH ICE. NEURO CHECKS WNL. COUGH PERSISTS GREEN SPUTUM . USING INCENTIVE SPIROMETER PREOP AND POSTOP. LUNGS CONGESTED POSOP WITH RHONCHI AT BASES. FOLEY PATENT. NO POSTOP  ANTIBIOTICS . DR Resurgens [facility] NOTIFIED . MD ORDERS ANCEF 2GM IV [id number] DOSES

## 2014-08-12 NOTE — Care Management Note (Signed)
Case Management Note  Patient Details  Name: Dean Collins MRN: [id number] Date of Birth:                     CM will talk with patient and family tomorrow, POD#1, to discuss home health needs and discharge planning.    Expected Discharge Date:                  Expected Discharge Plan:     In-House Referral:     Discharge planning Services     Post Acute Care Choice:    Choice offered to:     DME Arranged:    DME Agency:     HH Arranged:    [place] Agency:     Status of Service:     Medicare Important Message Given:    Date Medicare IM Given:    Medicare IM give by:    Date Additional Medicare IM Given:    Additional Medicare Important Message give by:     If discussed at [place] of Stay Meetings, dates discussed:    Additional Comments:  Iysha Mishkin A, RN 08/12/2014, 5:04 PM

## 2014-08-12 NOTE — Anesthesia Preprocedure Evaluation (Addendum)
Anesthesia Evaluation  Patient identified by MRN, date of birth, ID band Patient awake    Reviewed: Allergy & Precautions, NPO status , Patient's Chart, lab work & pertinent test results  Airway Mallampati: III  TM Distance: >3 FB Neck ROM: Full    Dental  (+) Missing   Pulmonary asthma (last used abuterol 2 yrs ago) , former smoker (quit x 30 yrs),          Cardiovascular     Neuro/Psych    GI/Hepatic GERD-  ,  Endo/Other  Hypothyroidism   Renal/GU      Musculoskeletal  (+) Arthritis -, Osteoarthritis,    Abdominal   Peds  Hematology  (+) Blood dyscrasia (CLL, chronic thrombocytopenia), ,   Anesthesia Other Findings   Reproductive/Obstetrics                           Anesthesia Physical Anesthesia Plan  ASA: III  Anesthesia Plan: General   Post-op Pain Management:    Induction: Intravenous  Airway Management Planned: LMA  Additional Equipment:   Intra-op Plan:   Post-operative Plan:   Informed Consent: I have reviewed the patients History and Physical, chart, labs and discussed the procedure including the risks, benefits and alternatives for the proposed anesthesia with the patient or authorized representative who has indicated his/her understanding and acceptance.     Plan Discussed with:   Anesthesia Plan Comments:       Anesthesia Quick Evaluation

## 2014-08-12 NOTE — Transfer of Care (Signed)
Immediate Anesthesia Transfer of Care Note  Patient: Dean Collins  Procedure(s) Performed: Procedure(s): TOTAL HIP ARTHROPLASTY ANTERIOR APPROACH (Right)  Patient Location: PACU  Anesthesia Type:General  Level of Consciousness: sedated  Airway & Oxygen Therapy: Patient connected to face mask oxygen  Post-op Assessment: Report given to RN  Post vital signs: stable  Last Vitals:  Filed Vitals:   08/12/14 0805  BP: 142/77  Pulse: 87  Temp: 36.9 C  Resp: 18    Complications: No apparent anesthesia complications

## 2014-08-12 NOTE — Anesthesia Postprocedure Evaluation (Signed)
  Anesthesia Post-op Note  Patient: Dean Collins  Procedure(s) Performed: Procedure(s): TOTAL HIP ARTHROPLASTY ANTERIOR APPROACH (Right)  Anesthesia type:General  Patient location: PACU  Post pain: Pain level controlled  Post assessment: Post-op Vital signs reviewed, Patient's Cardiovascular Status Stable, Respiratory Function Stable, Patent Airway and No signs of Nausea or vomiting  Post vital signs: Reviewed and stable  Last Vitals:  Filed Vitals:   08/12/14 1500  BP:   Pulse: 104  Temp:   Resp: 17    Level of consciousness: awake, alert  and patient cooperative  Complications: No apparent anesthesia complications

## 2014-08-12 NOTE — Op Note (Signed)
08/11/2014 - 08/12/2014  2:47 PM  PATIENT:  Dean Collins  74 y.o. male  PRE-OPERATIVE DIAGNOSIS:  fractured right femoral neck,severe osteoarthritis   POST-OPERATIVE DIAGNOSIS:  same  PROCEDURE:  Procedure(s): TOTAL HIP ARTHROPLASTY ANTERIOR APPROACH (Right)  SURGEON: Laurene Footman, MD  ASSISTANTS: None  ANESTHESIA:   general  EBL:  Total I/O In: 2595 [I.V.:2595] Out: 2450 [Urine:1850; Blood:600]  BLOOD ADMINISTERED:none  DRAINS: none   LOCAL MEDICATIONS USED:  MARCAINE     SPECIMEN:  Source of Specimen:  Right femoral head  DISPOSITION OF SPECIMEN:  PATHOLOGY  COUNTS:  YES  TOURNIQUET:  * No tourniquets in log *  IMPLANTS: Medacta 4 Amis femoral component, 28 mm head, 58 mm Versafit cup DM with liner  DICTATION: .Dragon Dictation  The patient was brought to the operating room and after general anesthesia was obtained the patient was placed on the operative table with the ipsilateral foot into the Medacta attachment, contralateral leg on a well-padded table. C-arm was brought in and preop template x-ray taken. After prepping and draping in usual sterile fashion appropriate patient identification and timeout procedures were completed. Anterior approach to the hip was obtained and centered over the greater trochanter and TFL muscle. The subcutaneous tissue was incised hemostasis being achieved by electrocautery. TFL fascia was incised and the muscle retracted laterally deep retractor placed. The lateral femoral circumflex vessels were identified and ligated. The anterior capsule was exposed and a capsulotomy performed. The neck was identified and a femoral neck cut carried out with a saw. The head was removed without difficulty and showed sclerotic femoral head and acetabulum. Reaming was carried out to 58 mm and a 58 mm cup trial gave appropriate fit to the acetabular component a 58 mm Versafit cup was impacted into position. The leg was then externally rotated and ischiofemoral  and pubofemoral releases carried out. The femur was sequentially broached to a size 4, size L trials were placed and the final components chosen. The 4 stem was inserted along with a L 28 mm head and 58 mm liner. The hip was reduced and was stable the wound was thoroughly irrigated with a dilute Betadine solution. The deep fascia view. Using a heavy Quill after infiltration of 30 cc of quarter percent Sensorcaine with epinephrine. 2-0 Quill to close the skin with skin staples Xeroform 4 x 4's ABDs and tape patient was sent to recovery in stable condition complications none specimen removed femoral head issue comes stable. iMplants: Medacta 4 Amis stem with an L 28 mm head, liner and 58 versafit DM cup   PLAN OF CARE: Continue as inpatient

## 2014-08-12 NOTE — Progress Notes (Signed)
Patient scheduled for surgery this afternoon to right hip. Alert and oriented . Foley cath. in place and draining well.. Pain med via iv . Patient sleeping at intervals. Ice pack to affected right leg. iv fluids infusing with patient NPO.

## 2014-08-13 LAB — CBC WITH DIFFERENTIAL/PLATELET
Basophils Absolute: 0.4 10*3/uL — ABNORMAL HIGH (ref 0–0.1)
Basophils Relative: 1 %
EOS ABS: 0 10*3/uL (ref 0–0.7)
EOS PCT: 0 %
HCT: 34.7 % — ABNORMAL LOW (ref 40.0–52.0)
HEMOGLOBIN: 11.2 g/dL — AB (ref 13.0–18.0)
LYMPHS PCT: 86 %
Lymphs Abs: 34.7 10*3/uL — ABNORMAL HIGH (ref 1.0–3.6)
MCH: 31 pg (ref 26.0–34.0)
MCHC: 32.4 g/dL (ref 32.0–36.0)
MCV: 96 fL (ref 80.0–100.0)
MONOS PCT: 4 %
Monocytes Absolute: 1.6 10*3/uL — ABNORMAL HIGH (ref 0.2–1.0)
NEUTROS PCT: 9 %
Neutro Abs: 3.6 10*3/uL (ref 1.4–6.5)
PLATELETS: 83 10*3/uL — AB (ref 150–440)
RBC: 3.62 MIL/uL — ABNORMAL LOW (ref 4.40–5.90)
RDW: 14.8 % — ABNORMAL HIGH (ref 11.5–14.5)
Smear Review: DECREASED
WBC: 40.3 10*3/uL — ABNORMAL HIGH (ref 3.8–10.6)

## 2014-08-13 LAB — BASIC METABOLIC PANEL
Anion gap: 6 (ref 5–15)
BUN: 18 mg/dL (ref 6–20)
CO2: 27 mmol/L (ref 22–32)
Calcium: 8.2 mg/dL — ABNORMAL LOW (ref 8.9–10.3)
Chloride: 106 mmol/L (ref 101–111)
Creatinine, Ser: 1.05 mg/dL (ref 0.61–1.24)
GFR calc Af Amer: >60 mL/min (ref >60)
GFR calc non Af Amer: >60 mL/min (ref >60)
Glucose, Bld: 168 mg/dL — ABNORMAL HIGH (ref 65–99)
Potassium: 5.1 mmol/L (ref 3.5–5.1)
Sodium: 139 mmol/L (ref 135–145)

## 2014-08-13 MED ORDER — SODIUM CHLORIDE 0.9 % IJ SOLN
10.0000 mL | Freq: Two times a day (BID) | INTRAMUSCULAR | Status: DC
  Administered 2014-08-14 – 2014-08-15 (×3): 10 mL via INTRAVENOUS

## 2014-08-13 NOTE — Progress Notes (Signed)
CSW consult for SNF. Per PT, pt appropriate for home with HHPT. RNCM aware. CSW signing off but available if CSW needs arise. Wandra Feinstein, MSW, LCSW [phone] (weekend coverage)

## 2014-08-13 NOTE — Progress Notes (Signed)
PT OOB TO CHAIR X1 TODAY.TOLERATED WELL. PAIN RELIEF WITH OXYCODONE. RIGHT HIP DRESSING CLEAN AND DRY. NEURO CHECKS WNL. PORTACATH  INTACT TO LEFT CHEST. SALINE LOCKED. VOIDING WITHOUT DIFFICULTY

## 2014-08-13 NOTE — Progress Notes (Signed)
   Subjective: 1 Day Post-Op Procedure(s) (LRB): TOTAL HIP ARTHROPLASTY ANTERIOR APPROACH (Right) Patient reports pain as mild.   Patient is well, and has had no acute complaints or problems We will start therapy today.  Plan is to go Home after hospital stay.  Objective: Vital signs in last 24 hours: Temp:  [97.4 F (36.3 C)-99.2 F (37.3 C)] 97.5 F (36.4 C) (05/22 0353) Pulse Rate:  [51-104] 81 (05/22 0353) Resp:  [14-20] 18 (05/22 0353) BP: (117-145)/(56-77) 122/69 mmHg (05/22 0353) SpO2:  [89 %-97 %] 97 % (05/22 0353)  Intake/Output from previous day: 05/21 0701 - 05/22 0700 In: 3375 [P.O.:480; I.V.:2895] Out: 3600 [Urine:3000; Blood:600] Intake/Output this shift:     Recent Labs  08/11/14 1353 08/13/14 0515  HGB 12.7* 11.2*    Recent Labs  08/11/14 1353 08/13/14 0515  WBC 25.0* 40.3*  RBC 4.06* 3.62*  HCT 38.7* 34.7*  PLT 77* 83*    Recent Labs  08/11/14 1353 08/13/14 0515  NA 140 139  K 4.2 5.1  CL 106 106  CO2 28 27  BUN 19 18  CREATININE 1.03 1.05  GLUCOSE 189* 168*  CALCIUM 8.6* 8.2*    Recent Labs  08/11/14 1353  INR 1.01    EXAM General - Patient is Alert, Appropriate and Oriented Extremity - Neurologically intact Neurovascular intact Sensation intact distally Intact pulses distally Dorsiflexion/Plantar flexion intact No cellulitis present Dressing - dressing C/D/I Motor Function - intact, moving foot and toes well on exam.   Past Medical History  Diagnosis Date  . Incontinence   . Arthritis   . Cancer     CLL prostate  . Melanoma     scalp  . Diverticulitis   . CLL (chronic lymphocytic leukemia) 2003  . Hypothyroidism   . Anxiety   . Medical history non-contributory     Assessment/Plan:   1 Day Post-Op Procedure(s) (LRB): TOTAL HIP ARTHROPLASTY ANTERIOR APPROACH (Right) Active Problems:   Femoral neck fracture  Estimated body mass index is 34.54 kg/(m^2) as calculated from the following:   Height as of this  encounter: 5\' 9"  (1.753 m).   Weight as of this encounter: 106.142 kg (234 lb). Advance diet Up with therapy  Recheck labs in the am   DVT Prophylaxis - Aspirin Weight-Bearing as tolerated to right leg D/C O2 and Pulse OX and try on Room Air  T. Rachelle Hora, PA-C [place] 08/13/2014, 7:29 AM

## 2014-08-13 NOTE — Progress Notes (Signed)
DR MENZ ON ROUNDS. PT EATING AND DRINKING.. MD ORDERS [place] AND FLUSH PORT PER PROTOCOL

## 2014-08-13 NOTE — Progress Notes (Signed)
Patient resting well this shift. No nausea. Slight postop confusion improving . Iv fluids and abx continue  .po  and iv  Pain med given.uses incentive spirometry.drinking po 's.

## 2014-08-13 NOTE — Progress Notes (Signed)
TOLBERT MATHESON is a 74 y.o. male patient. 1. Secondary hypertension, unspecified   2. Hypertension   3. Hip fracture, right   4. Status post hip replacement    Past Medical History  Diagnosis Date  . Incontinence   . Arthritis   . Cancer     CLL prostate  . Melanoma     scalp  . Diverticulitis   . CLL (chronic lymphocytic leukemia) 2003  . Hypothyroidism   . Anxiety   . Medical history non-contributory    Current Facility-Administered Medications  Medication Dose Route Frequency Provider Last Rate Last Dose  . 0.9 %  sodium chloride infusion   Intravenous Continuous Hessie Knows, MD 100 mL/hr at 08/13/14 0335    . acetaminophen (TYLENOL) tablet 650 mg  650 mg Oral Q6H PRN Hessie Knows, MD       Or  . acetaminophen (TYLENOL) suppository 650 mg  650 mg Rectal Q6H PRN Hessie Knows, MD      . acetaminophen (TYLENOL) tablet 1,000 mg  1,000 mg Oral 4 times per day Hessie Knows, MD   1,000 mg at 08/13/14 0600  . alum & mag hydroxide-simeth (MAALOX/MYLANTA) 200-200-20 MG/5ML suspension 30 mL  30 mL Oral Q4H PRN Hessie Knows, MD      . amoxicillin-clavulanate (AUGMENTIN) 875-125 MG per tablet 1 tablet  1 tablet Oral Q12H Aldean Jewett, MD   1 tablet at 08/13/14 0920  . aspirin EC tablet 325 mg  325 mg Oral Q breakfast Hessie Knows, MD   325 mg at 08/13/14 0920  . bisacodyl (DULCOLAX) suppository 10 mg  10 mg Rectal Daily PRN Hessie Knows, MD      . diphenhydrAMINE (BENADRYL) 12.5 MG/5ML elixir 12.5-25 mg  12.5-25 mg Oral Q4H PRN Hessie Knows, MD      . docusate sodium (COLACE) capsule 100 mg  100 mg Oral BID Hessie Knows, MD   100 mg at 08/13/14 5462  . guaiFENesin (MUCINEX) 12 hr tablet 600 mg  600 mg Oral BID Aldean Jewett, MD   600 mg at 08/13/14 0920  . levothyroxine (SYNTHROID, LEVOTHROID) tablet 75 mcg  75 mcg Oral QAC breakfast Aldean Jewett, MD      . magnesium hydroxide (MILK OF MAGNESIA) suspension 30 mL  30 mL Oral Daily PRN Hessie Knows, MD      .  menthol-cetylpyridinium (CEPACOL) lozenge 3 mg  1 lozenge Oral PRN Hessie Knows, MD       Or  . phenol [facility]) mouth spray 1 spray  1 spray Mouth/Throat PRN Hessie Knows, MD      . morphine 2 MG/ML injection 2 mg  2 mg Intravenous Q2H PRN Hessie Knows, MD   2 mg at 08/13/14 0335  . ondansetron (ZOFRAN) tablet 4 mg  4 mg Oral Q6H PRN Hessie Knows, MD       Or  . ondansetron [facility]) injection 4 mg  4 mg Intravenous Q6H PRN Hessie Knows, MD      . oxyCODONE (Oxy IR/ROXICODONE) immediate release tablet 5-10 mg  5-10 mg Oral Q3H PRN Hessie Knows, MD   10 mg at 08/13/14 7035  . sertraline (ZOLOFT) tablet 50 mg  50 mg Oral QHS Aldean Jewett, MD   50 mg at 08/12/14 2102  . simvastatin (ZOCOR) tablet 40 mg  40 mg Oral q1800 Aldean Jewett, MD   40 mg at 08/12/14 1649   No Known Allergies Active Problems:   Femoral neck fracture  Blood pressure  125/64, pulse 88, temperature 97.7 F (36.5 C), temperature source Oral, resp. rate 19, height 5\' 9"  (1.753 m), weight 106.142 kg (234 lb), SpO2 98 %.  Subjective  Cough is improving, leg sore, no pt yet, no bm's. Long hx cll noted, ortho notes reviewed Objective  nad Chest; clear Heart; RRR no mrg ABD; nt, nl bs Ext; no cce Assessment & Plan - CLL; FOllow wbc, bump with stress of surgery noted, follow to ensure resolution - Prolonged URI; Imrpovng - Hip Fx; defer this and related preventive measures to the expertise of ortho Signed Kirk Ruths. 08/13/2014

## 2014-08-13 NOTE — Care Management Note (Signed)
Case Management Note  Patient Details  Name: Dean Collins MRN: [id number] Date of Birth: 08-29-1940  Subjective/Objective:    Discharge Planning                Action/Plan: Discussed discharge planning with Mr Tay who states that he is prepared to go to Rehab if needed, or if he progresses well, to have home health PT.  Unable to decide on a potential home health provider at this time, and will discuss list of providers with his family. Reports that he has a rolling walker at home from a previous knee surgery, and does not need any other home equipment. Pharmacy is [facility] [address] in [place]. Mr Vanwagner mentioned that he has 4 steps into his house to negotiate after he returns home.     Expected Discharge Date:                  Expected Discharge Plan:     In-House Referral:     Discharge planning Services     Post Acute Care Choice:    Choice offered to:     DME Arranged:    DME Agency:     HH Arranged:    [place] Agency:     Status of Service:     Medicare Important Message Given:    Date Medicare IM Given:    Medicare IM give by:    Date Additional Medicare IM Given:    Additional Medicare Important Message give by:     If discussed at [place] of Stay Meetings, dates discussed:    Additional Comments:  Nils Thor A, RN 08/13/2014, 10:02 AM

## 2014-08-13 NOTE — Evaluation (Signed)
Physical Therapy Evaluation Patient Details Name: Dean Collins MRN: [id number] DOB: October 10, 1940 Today's Date: 08/13/2014   History of Present Illness  R total hip replacement (anterior approach)  Clinical Impression  Pt does well with POD1 efforts.  He shows good strength in R LE, except expected SLR weakness with anterior approach.  He is hesitant to fully put weight through his R heel, but with cuing improves cadence and is able to ambulate ~40 ft with walker.      Follow Up Recommendations Home health PT    Equipment Recommendations       Recommendations for Other Services       Precautions / Restrictions Precautions Precautions: Anterior Hip Precaution Booklet Issued: Yes (comment) Restrictions Weight Bearing Restrictions: Yes RLE Weight Bearing: Weight bearing as tolerated      Mobility  Bed Mobility Overal bed mobility: Needs Assistance Bed Mobility: Supine to Sit     Supine to sit: Min guard        Transfers Overall transfer level: Needs assistance Equipment used: Rolling walker (2 wheeled) Transfers: Sit to/from Stand Sit to Stand: Min guard            Ambulation/Gait Ambulation/Gait assistance: Min guard Ambulation Distance (Feet): 40 Feet Assistive device: Rolling walker (2 wheeled)       General Gait Details: pt with expected slow, guarded ambulation with R WBing hesitation, but ultimately he is safe and does well for the first walking attempt  Stairs            Wheelchair Mobility    Modified Rankin (Stroke Patients Only)       Balance                                             Pertinent Vitals/Pain Pain Assessment: 0-10 Pain Score: 5  Pain Location: R groin    Home Living Family/patient expects to be discharged to:: Private residence Living Arrangements: Spouse/significant other Available Help at Discharge: Family Type of Home: House Home Access: Stairs to enter   [employer] of Steps:  2 steps, then 3 with rail Home Layout: One level Home Equipment: Environmental consultant - 2 wheels      Prior Function Level of Independence: Independent         Comments: Pt still working part time, very active     Journalist, newspaper        Extremity/Trunk Assessment   Upper Extremity Assessment: Overall WFL for tasks assessed           Lower Extremity Assessment: RLE deficits/detail RLE Deficits / Details: grossly 3+/5, except with hip flexion       Communication   Communication: No difficulties  Cognition Arousal/Alertness: Awake/alert Behavior During Therapy: WFL for tasks assessed/performed Overall Cognitive Status: Within Functional Limits for tasks assessed                      General Comments      Exercises Total Joint Exercises Ankle Circles/Pumps: AROM;10 reps Quad Sets: AROM;10 reps Gluteal Sets: AROM;10 reps Heel Slides: AROM;10 reps Hip ABduction/ADduction: AROM;10 reps Straight Leg Raises:  (unable to do SLRs)      Assessment/Plan    PT Assessment Patient needs continued PT services  PT Diagnosis Difficulty walking;Generalized weakness;Acute pain   PT Problem List Decreased strength;Decreased balance;Decreased activity tolerance;Decreased safety awareness  PT Treatment Interventions  Gait training;Therapeutic activities;Therapeutic exercise;Balance training;Stair training;Patient/family education   PT Goals (Current goals can be found in the Care Plan section) Acute Rehab PT Goals Patient Stated Goal: "I hope I can go home" PT Goal Formulation: With patient/family Time For Goal Achievement: 08/27/14 Potential to Achieve Goals: Good    Frequency BID   Barriers to discharge        Co-evaluation               End of Session   Activity Tolerance: Patient limited by fatigue Patient left: with chair alarm set;with family/visitor present           Time: 7096-4383 PT Time Calculation (min) (ACUTE ONLY): 29 min   Charges:   PT  Evaluation $Initial PT Evaluation Tier I: 1 Procedure PT Treatments $Therapeutic Exercise: 8-22 mins   PT G Codes:       Wayne Both, PT, DPT [phone]  Kreg Shropshire 08/13/2014, 3:11 PM

## 2014-08-14 ENCOUNTER — Inpatient Hospital Stay: Payer: Medicare Other

## 2014-08-14 ENCOUNTER — Encounter: Payer: Self-pay | Admitting: Orthopedic Surgery

## 2014-08-14 LAB — CBC WITH DIFFERENTIAL/PLATELET
Basophils Absolute: 0.4 10*3/uL — ABNORMAL HIGH (ref 0–0.1)
Basophils Relative: 1 %
EOS ABS: 0.4 10*3/uL (ref 0–0.7)
Eosinophils Relative: 1 %
HEMATOCRIT: 34 % — AB (ref 40.0–52.0)
Hemoglobin: 10.9 g/dL — ABNORMAL LOW (ref 13.0–18.0)
Lymphocytes Relative: 84 %
Lymphs Abs: 31.4 10*3/uL — ABNORMAL HIGH (ref 1.0–3.6)
MCH: 30.6 pg (ref 26.0–34.0)
MCHC: 31.9 g/dL — AB (ref 32.0–36.0)
MCV: 95.8 fL (ref 80.0–100.0)
MONO ABS: 1.5 10*3/uL — AB (ref 0.2–1.0)
MONOS PCT: 4 %
NEUTROS ABS: 3.7 10*3/uL (ref 1.4–6.5)
NEUTROS PCT: 10 %
Platelets: 94 10*3/uL — ABNORMAL LOW (ref 150–440)
RBC: 3.55 MIL/uL — AB (ref 4.40–5.90)
RDW: 15 % — ABNORMAL HIGH (ref 11.5–14.5)
Smear Review: DECREASED
WBC: 37.4 10*3/uL — ABNORMAL HIGH (ref 3.8–10.6)

## 2014-08-14 LAB — BASIC METABOLIC PANEL
Anion gap: 4 — ABNORMAL LOW (ref 5–15)
BUN: 19 mg/dL (ref 6–20)
CO2: 30 mmol/L (ref 22–32)
Calcium: 8.1 mg/dL — ABNORMAL LOW (ref 8.9–10.3)
Chloride: 103 mmol/L (ref 101–111)
Creatinine, Ser: 1.13 mg/dL (ref 0.61–1.24)
GFR calc non Af Amer: >60 mL/min (ref >60)
Glucose, Bld: 169 mg/dL — ABNORMAL HIGH (ref 65–99)
Potassium: 4.9 mmol/L (ref 3.5–5.1)
Sodium: 137 mmol/L (ref 135–145)

## 2014-08-14 MED ORDER — SODIUM CHLORIDE 0.9 % IJ SOLN
INTRAMUSCULAR | Status: AC
  Administered 2014-08-14: 10 mL via INTRAVENOUS
  Filled 2014-08-14: qty 10

## 2014-08-14 MED ORDER — TRAMADOL HCL 50 MG PO TABS
100.0000 mg | ORAL_TABLET | Freq: Four times a day (QID) | ORAL | Status: DC | PRN
  Administered 2014-08-14 (×2): 50 mg via ORAL
  Filled 2014-08-14 (×2): qty 1

## 2014-08-14 MED ORDER — IPRATROPIUM-ALBUTEROL 0.5-2.5 (3) MG/3ML IN SOLN
3.0000 mL | Freq: Four times a day (QID) | RESPIRATORY_TRACT | Status: DC
  Administered 2014-08-14 – 2014-08-15 (×5): 3 mL via RESPIRATORY_TRACT
  Filled 2014-08-14 (×6): qty 3

## 2014-08-14 NOTE — Discharge Planning (Signed)
ANTERIOR APPROACH TOTAL HIP REPLACEMENT POSTOPERATIVE DIRECTIONS   Hip Rehabilitation, Guidelines Following Surgery  The results of a hip operation are greatly improved after range of motion and muscle strengthening exercises. Follow all safety measures which are given to protect your hip. If any of these exercises cause increased pain or swelling in your joint, decrease the amount until you are comfortable again. Then slowly increase the exercises. Call your caregiver if you have problems or questions.   HOME CARE INSTRUCTIONS  Remove items at home which could result in a fall. This includes throw rugs or furniture in walking pathways.   ICE to the affected hip every three hours for 30 minutes at a time and then as needed for pain and swelling.  Continue to use ice on the hip for pain and swelling from surgery. You may notice swelling that will progress down to the foot and ankle.  This is normal after surgery.  Elevate the leg when you are not up walking on it.    Continue to use the breathing machine which will help keep your temperature down.  It is common for your temperature to cycle up and down following surgery, especially at night when you are not up moving around and exerting yourself.  The breathing machine keeps your lungs expanded and your temperature down.  Do not place pillow under knee, focus on keeping the knee straight while resting  DIET You may resume your previous home diet once your are discharged from the hospital.  DRESSING / WOUND CARE / SHOWERING You may change your dressing 3-5 days after surgery.  Then change the dressing every day with sterile gauze.  Please use good hand washing techniques before changing the dressing.  Do not use any lotions or creams on the incision until instructed by your surgeon. Keep your dressing dry with showering.  You can keep it covered and pat dry. Change the surgical dressing daily and reapply a dry dressing each time.  ACTIVITY Walk  with your walker as instructed. Use walker as long as suggested by your caregivers. Avoid periods of inactivity such as sitting longer than an hour when not asleep. This helps prevent blood clots.  You may resume a sexual relationship in one month or when given the OK by your doctor.  You may return to work once you are cleared by your doctor.  Do not drive a car for 6 weeks or until released by you surgeon.  Do not drive while taking narcotics.  WEIGHT BEARING Weight bearing as tolerated with assist device (walker, cane, etc) as directed, use it as long as suggested by your surgeon or therapist, typically at least 4-6 weeks.  POSTOPERATIVE CONSTIPATION PROTOCOL Constipation - defined medically as fewer than three stools per week and severe constipation as less than one stool per week.  One of the most common issues patients have following surgery is constipation.  Even if you have a regular bowel pattern at home, your normal regimen is likely to be disrupted due to multiple reasons following surgery.  Combination of anesthesia, postoperative narcotics, change in appetite and fluid intake all can affect your bowels.  In order to avoid complications following surgery, here are some recommendations in order to help you during your recovery period.  Colace (docusate) - Pick up an over-the-counter form of Colace or another stool softener and take twice a day as long as you are requiring postoperative pain medications.  Take with a full glass of water daily.  If   you experience loose stools or diarrhea, hold the colace until you stool forms back up.  If your symptoms do not get better within 1 week or if they get worse, check with your doctor.  Dulcolax (bisacodyl) - Pick up over-the-counter and take as directed by the product packaging as needed to assist with the movement of your bowels.  Take with a full glass of water.  Use this product as needed if not relieved by Colace only.   MiraLax  (polyethylene glycol) - Pick up over-the-counter to have on hand.  MiraLax is a solution that will increase the amount of water in your bowels to assist with bowel movements.  Take as directed and can mix with a glass of water, juice, soda, coffee, or tea.  Take if you go more than two days without a movement. Do not use MiraLax more than once per day. Call your doctor if you are still constipated or irregular after using this medication for 7 days in a row.  If you continue to have problems with postoperative constipation, please contact the office for further assistance and recommendations.  If you experience "the worst abdominal pain ever" or develop nausea or vomiting, please contact the office immediatly for further recommendations for treatment.  ITCHING  If you experience itching with your medications, try taking only a single pain pill, or even half a pain pill at a time.  You can also use Benadryl over the counter for itching or also to help with sleep.   TED HOSE STOCKINGS Wear the elastic stockings on both legs for three weeks following surgery during the day but you may remove then at night for sleeping.  MEDICATIONS See your medication summary on the "After Visit Summary" that the nursing staff will review with you prior to discharge.  You may have some home medications which will be placed on hold until you complete the course of blood thinner medication.  It is important for you to complete the blood thinner medication as prescribed by your surgeon.  Continue your approved medications as instructed at time of discharge.  PRECAUTIONS If you experience chest pain or shortness of breath - call 911 immediately for transfer to the hospital emergency department.  If you develop a fever greater that 101 F, purulent drainage from wound, increased redness or drainage from wound, foul odor from the wound/dressing, or calf pain - CONTACT YOUR SURGEON.                                                    FOLLOW-UP APPOINTMENTS Make sure you keep all of your appointments after your operation with your surgeon and caregivers. You should call the office at the above phone number and make an appointment for approximately two weeks after the date of your surgery or on the date instructed by your surgeon outlined in the "After Visit Summary".  RANGE OF MOTION AND STRENGTHENING EXERCISES  These exercises are designed to help you keep full movement of your hip joint. Follow your caregiver's or physical therapist's instructions. Perform all exercises about fifteen times, three times per day or as directed. Exercise both hips, even if you have had only one joint replacement. These exercises can be done on a training (exercise) mat, on the floor, on a table or on a bed. Use whatever works the best and   is most comfortable for you. Use music or television while you are exercising so that the exercises are a pleasant break in your day. This will make your life better with the exercises acting as a break in routine you can look forward to.  Lying on your back, slowly slide your foot toward your buttocks, raising your knee up off the floor. Then slowly slide your foot back down until your leg is straight again.  Lying on your back spread your legs as far apart as you can without causing discomfort.  Lying on your side, raise your upper leg and foot straight up from the floor as far as is comfortable. Slowly lower the leg and repeat.  Lying on your back, tighten up the muscle in the front of your thigh (quadriceps muscles). You can do this by keeping your leg straight and trying to raise your heel off the floor. This helps strengthen the largest muscle supporting your knee.  Lying on your back, tighten up the muscles of your buttocks both with the legs straight and with the knee bent at a comfortable angle while keeping your heel on the floor.   IF YOU ARE TRANSFERRED TO A SKILLED REHAB FACILITY If the patient is  transferred to a skilled rehab facility following release from the hospital, a list of the current medications will be sent to the facility for the patient to continue.  When discharged from the skilled rehab facility, please have the facility set up the patient's Home Health Physical Therapy prior to being released. Also, the skilled facility will be responsible for providing the patient with their medications at time of release from the facility to include their pain medication, the muscle relaxants, and their blood thinner medication. If the patient is still at the rehab facility at time of the two week follow up appointment, the skilled rehab facility will also need to assist the patient in arranging follow up appointment in our office and any transportation needs.  MAKE SURE YOU:  Understand these instructions.  Get help right away if you are not doing well or get worse.    Pick up stool softner and laxative for home use following surgery while on pain medications. Do not submerge incision under water. Please use good hand washing techniques while changing dressing each day. May shower starting three days after surgery. Please use a clean towel to pat the incision dry following showers. Continue to use ice for pain and swelling after surgery. Do not use any lotions or creams on the incision until instructed by your surgeon. 

## 2014-08-14 NOTE — Progress Notes (Signed)
Patient using po and iv pain meds. Encouraged to drink more water because urine is concentrated,. Coughing some but  uses IS. Noted to o2  desat this shift in 80's  Added o2 at 1 L.voiding in urial. Working well with PT may be able to go home  With homehealth vs rehab facility

## 2014-08-14 NOTE — Progress Notes (Signed)
Pt ambulating with Physical Therapy, became weak, pale, diaphoretic. Blood pressure taken by Nurse Aid and recorded, patient sitting 89/45. Pt assisted to recline in bed. Blood pressure taken manually 144/70, color improved, no longer diaphoretic. Pt. States would like to stay in bed for a while.

## 2014-08-14 NOTE — Evaluation (Signed)
Occupational Therapy Evaluation Patient Details Name: Dean Collins MRN: [id number] DOB: 07-09-1940 Today's Date: 08/14/2014    History of Present Illness R total hip replacement (anterior approach)   Clinical Impression   Pt is 74 year old male s/p R THR (anterior)  who lives at home with his wife. 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  Pt is currently limited in functional ADLs due to pain, decreased ROM, and low BP.  Pt requires moderate assist for LB dressing and bathing skills due to pain and decreased AROM of R LE  and would benefit from continued skilled OT services for education in assistive devices, functional mobility, and education in recommendations for home modifications to increase safety and prevent falls.  Pt is a good candidate for SNF to continue rehabilitation.       Follow Up Recommendations  SNF    Equipment Recommendations  3 in 1 bedside comode (rec BSC over toilet but pt declined and wants to use toilet riser)    Recommendations for Other Services       Precautions / Restrictions Precautions Precautions: Anterior Hip Restrictions Weight Bearing Restrictions: Yes RLE Weight Bearing: Weight bearing as tolerated      Mobility Bed Mobility Overal bed mobility: Needs Assistance Bed Mobility: Supine to Sit     Supine to sit: Min assist (Significant increased time/effort)        Transfers Overall transfer level: Needs assistance Equipment used: Rolling walker (2 wheeled) Transfers: Sit to/from Stand Sit to Stand: Min guard         General transfer comment:  (Cues for hand placement)    Balance                                            ADL                                         General ADL Comments: Pt presents with independence in UB dressing and bathing but needs moderate assist and cues for LB  dressing using reacher and sock aid bed level due to declining sitting EOB or getting up in chair due to low BP earlier and currently sweating and gown changed     Vision     Perception     Praxis      Pertinent Vitals/Pain Pain Assessment: 0-10 Pain Score: 4  Pain Location: R hip Pain Descriptors / Indicators: Sore Pain Intervention(s): Limited activity within patient's tolerance;Monitored during session;Premedicated before session     Hand Dominance Right   Extremity/Trunk Assessment Upper Extremity Assessment Upper Extremity Assessment: Overall WFL for tasks assessed   Lower Extremity Assessment Lower Extremity Assessment: Defer to PT evaluation       Communication Communication Communication: No difficulties   Cognition Arousal/Alertness: Awake/alert Behavior During Therapy: WFL for tasks assessed/performed Overall Cognitive Status: Within Functional Limits for tasks assessed                     General Comments       Exercises       Shoulder Instructions      Home Living Family/patient expects to be  discharged to:: Private residence Living Arrangements: Spouse/significant other Available Help at Discharge: Family Type of Home: House Home Access: Stairs to enter [employer] of Steps: 2 steps, then 3 with [place]: One level     Bathroom Shower/Tub: Gaffer (built in seat)   Biochemist, clinical: [place] Accessibility: Yes How Accessible: Accessible via walker Home Equipment: Walker - 2 wheels   Additional Comments: rec pt use a BSC over toilet but he prefers to use a toilet riser      Prior Functioning/Environment Level of Independence: Independent        Comments: 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    OT Diagnosis: Generalized weakness;Acute pain   OT Problem List: Decreased strength;Decreased range of motion;Decreased activity tolerance;Pain (need to montor  BP---had low BP when up and walking  89/45)   OT Treatment/Interventions: Self-care/ADL training;DME and/or AE instruction;Therapeutic activities;Patient/family education    OT Goals(Current goals can be found in the care plan section) Acute Rehab OT Goals Patient Stated Goal: to get my blood pressure under control OT Goal Formulation: With patient Time For Goal Achievement: 08/28/14 Potential to Achieve Goals: Good  OT Frequency: Min 1X/week   Barriers to D/C:            Co-evaluation              End of Session Equipment Utilized During Treatment:  (reacher and sock aid)  Activity Tolerance: Patient limited by fatigue;Patient limited by pain Patient left: in bed;with call bell/phone within reach;with bed alarm set   Time: 6168-3729 OT Time Calculation (min): 25 min Charges:  OT General Charges $OT Visit: 1 Procedure OT Evaluation $Initial OT Evaluation Tier I: 1 Procedure OT Treatments $Self Care/Home Management : 8-22 mins G-Codes:    Kynzee Devinney 08-24-14, 2:41 PM    Chrys Racer, OTR/L ascom [phone]

## 2014-08-14 NOTE — Clinical Social Work Note (Signed)
Clinical Social Work Assessment  Patient Details  Name: Dean Collins MRN: [id number] Date of Birth: May 28, 1940  Date of referral:  08/14/14               Reason for consult:  Facility Placement                Permission sought to share information with:  Chartered certified accountant granted to share information::  Yes, Verbal Permission Granted  Name::      [place]::   [place]  Relationship::     Contact Information:     Housing/Transportation Living arrangements for the past 2 months:  [place] of Information:  Patient, Spouse Patient Interpreter Needed:  None Criminal Activity/Legal Involvement Pertinent to Current Situation/Hospitalization:  No - Comment as needed Significant Relationships:  Spouse Lives with:  Spouse Do you feel safe going back to the place where you live?  Yes Need for family participation in patient care:  No (Coment)  Care giving concerns: Patient lives in [place] with his wife Dean Collins.    Social Worker assessment / plan: [place] representative (CSW) met with patient and his wife Dean Collins at bedside. PT was recommending home health and changed recommendation today to SNF. CSW introduced self and explained role of CSW department. Patient has sitting up in bed and pleasant. CSW explained SNF process. Patient and wife are agreeable to SNF search and prefer [facility] FL2 complete and faxed out. CSW asked Kim admissions coordinator at [facility] to review referral. CSW will continue to follow and assist as needed.   Blima Rich, LCSWA [phone]  Employment status:  Retired Nurse, adult PT Recommendations:  [place] / Referral to community resources:  [place]  Patient/Family's Response to care: Patient and wife are agreeable to SNF search in [place].    Patient/Family's Understanding of and Emotional Response to  Diagnosis, Current Treatment, and Prognosis: Patient was looking forward to going home but is agreeable to SNF.   Emotional Assessment Appearance:  Appears stated age Attitude/Demeanor/Rapport:    Affect (typically observed):  Accepting, Calm Orientation:  Oriented to Self, Oriented to Place, Oriented to  Time, Oriented to Situation Alcohol / Substance use:  Not Applicable Psych involvement (Current and /or in the community):  No (Comment)  Discharge Needs  Concerns to be addressed:  Discharge Planning Concerns Readmission within the last 30 days:  No Current discharge risk:  None Barriers to Discharge:  Continued Medical Work up   Elwyn Reach 08/14/2014, 4:45 PM

## 2014-08-14 NOTE — Progress Notes (Signed)
  Subjective: 2 Days Post-Op Procedure(s) (LRB): TOTAL HIP ARTHROPLASTY ANTERIOR APPROACH (Right) Patient reports pain as mild.   Patient seen in rounds with Dr. Rudene Christians. Patient is well, and has had no acute complaints or problems Plan is to go Depending on how PT is going.  Would like to go home. after hospital stay. Negative for chest pain and shortness of breath Fever: no Gastrointestinal:negative for nausea and vomiting  Objective: Vital signs in last 24 hours: Temp:  [97.7 F (36.5 C)-99.5 F (37.5 C)] 99.5 F (37.5 C) (05/23 0346) Pulse Rate:  [73-116] 116 (05/23 0346) Resp:  [18-19] 18 (05/23 0346) BP: (123-148)/(54-80) 134/56 mmHg (05/23 0346) SpO2:  [89 %-98 %] 92 % (05/23 0346)  Intake/Output from previous day:  Intake/Output Summary (Last 24 hours) at 08/14/14 0617 Last data filed at 08/14/14 0346  Gross per 24 hour  Intake   3460 ml  Output   1550 ml  Net   1910 ml    Intake/Output this shift: Total I/O In: -  Out: 825 [Urine:825]  Labs:  Recent Labs  08/11/14 1353 08/13/14 0515 08/14/14 0440  HGB 12.7* 11.2* 10.9*    Recent Labs  08/13/14 0515 08/14/14 0440  WBC 40.3* 37.4*  RBC 3.62* 3.55*  HCT 34.7* 34.0*  PLT 83* 94*    Recent Labs  08/13/14 0515 08/14/14 0440  NA 139 137  K 5.1 4.9  CL 106 103  CO2 27 30  BUN 18 19  CREATININE 1.05 1.13  GLUCOSE 168* 169*  CALCIUM 8.2* 8.1*    Recent Labs  08/11/14 1353  INR 1.01     EXAM General - Patient is Alert and Oriented Extremity - Neurologically intact Dorsiflexion/Plantar flexion intact  Dressing/Incision - clean, healing, clear drainage, Changed to a new dressing  Motor Function - intact, moving foot and toes well on exam.   Past Medical History  Diagnosis Date  . Incontinence   . Arthritis   . Cancer     CLL prostate  . Melanoma     scalp  . Diverticulitis   . CLL (chronic lymphocytic leukemia) 2003  . Hypothyroidism   . Anxiety   . Medical history  non-contributory     Assessment/Plan: 2 Days Post-Op Procedure(s) (LRB): TOTAL HIP ARTHROPLASTY ANTERIOR APPROACH (Right) Active Problems:   Femoral neck fracture  Estimated body mass index is 34.54 kg/(m^2) as calculated from the following:   Height as of this encounter: 5\' 9"  (1.753 m).   Weight as of this encounter: 106.142 kg (234 lb). Up with therapy Plan for discharge tomorrow  DVT Prophylaxis - Aspirin, Foot Pumps and TED hose Weight-Bearing as tolerated to Right leg  Reche Dixon, PA-C Orthopaedic Surgery 08/14/2014, 6:17 AM

## 2014-08-14 NOTE — Care Management Note (Signed)
Case Management Note  Patient Details  Name: Dean Collins MRN: [id number] Date of Birth: 09-May-1940  Subjective/Objective:  PT recommending SNF. CSW aware and following.                   Action/Plan:   Expected Discharge Date:                  Expected Discharge Plan:     In-House Referral:     Discharge planning Services     Post Acute Care Choice:    Choice offered to:     DME Arranged:    DME Agency:     HH Arranged:    [place]:     Status of Service:     Medicare Important Message Given:  Yes Date Medicare IM Given:  08/14/14 Medicare IM give by:  Orvan July Date Additional Medicare IM Given:    Additional Medicare Important Message give by:     If discussed at Home of Stay Meetings, dates discussed:    Additional Comments:  Jolly Mango, RN 08/14/2014, 2:17 PM

## 2014-08-14 NOTE — Progress Notes (Signed)
Physical Therapy Treatment Patient Details Name: Dean Collins MRN: [id number] DOB: 11-24-1940 Today's Date: 08/14/2014    History of Present Illness R total hip replacement (anterior approach)    PT Comments    Pt able to ambulate on room air with O2 levels 90-91% today. O2 returned on at 1L post session. Nsg notified. Plan to see pt this p.m. Continue to monitor pt's O2 levels and BP.  Follow Up Recommendations  SNF     Equipment Recommendations   (Has rolling walker)    Recommendations for Other Services       Precautions / Restrictions Precautions Precautions: Anterior Hip Restrictions Weight Bearing Restrictions: Yes RLE Weight Bearing: Weight bearing as tolerated    Mobility  Bed Mobility Overal bed mobility: Needs Assistance Bed Mobility: Supine to Sit     Supine to sit: Min assist (Significant increased time/effort)        Transfers Overall transfer level: Needs assistance Equipment used: Rolling walker (2 wheeled) Transfers: Sit to/from Stand Sit to Stand: Min guard         General transfer comment:  (Cues for hand placement)  Ambulation/Gait Ambulation/Gait assistance: Min guard Ambulation Distance (Feet): 34 Feet Assistive device: Rolling walker (2 wheeled) Gait Pattern/deviations: Step-through pattern;Decreased step length - right;Decreased dorsiflexion - right;Decreased weight shift to right;Wide base of support Gait velocity: significantly reduced Gait velocity interpretation: <1.8 ft/sec, indicative of risk for recurrent falls General Gait Details: Pt became weakened throughout especially RLE and BUEs, diaphoretic and pale. Pt required increased assist last 6 ft to bed. Nsg contacted BP checked in sit after several minutes 89/45. Pt retuned to bed   Stairs            Wheelchair Mobility    Modified Rankin (Stroke Patients Only)       Balance                                    Cognition Arousal/Alertness:  Awake/alert Behavior During Therapy: WFL for tasks assessed/performed (Became anxious/weakened/diaphoretic during walk; BP issues) Overall Cognitive Status: Within Functional Limits for tasks assessed                      Exercises      General Comments        Pertinent Vitals/Pain Pain Assessment: 0-10 Pain Score: 8  Pain Location: R hip    Home Living                      Prior Function            PT Goals (current goals can now be found in the care plan section)      Frequency  BID    PT Plan Current plan remains appropriate    Co-evaluation             End of Session Equipment Utilized During Treatment: Gait belt Activity Tolerance: Patient limited by fatigue;Patient limited by pain (limited by BP) Patient left: in bed;with call bell/phone within reach;with bed alarm set;with nursing/sitter in room     Time: 8937-3428 PT Time Calculation (min) (ACUTE ONLY): 41 min  Charges:  $Gait Training: 23-37 mins $Therapeutic Activity: 8-22 mins                    G Codes:      Charlaine Dalton 08/14/2014, 11:02 AM

## 2014-08-14 NOTE — Progress Notes (Signed)
Dean Collins is a 74 y.o. male  Hip fx, CLL  SUBJECTIVE:  Pt in NAD. Remains on low dose O2. Ambulating with walker. Denies CP or SOB. Passing gas.  ______________________________________________________________________  ROS: Review of systems is unremarkable for any active cardiac,respiratory, GI, GU, hematologic, neurologic or psychiatric systems, 10 systems reviewed.  @CMEDLIST @  Past Medical History  Diagnosis Date  . Incontinence   . Arthritis   . Cancer     CLL prostate  . Melanoma     scalp  . Diverticulitis   . CLL (chronic lymphocytic leukemia) 2003  . Hypothyroidism   . Anxiety   . Medical history non-contributory     Past Surgical History  Procedure Laterality Date  . Replacement total knee      x2  . Prostatectomy  1993  . Colonoscopy  2013-2014?    PHYSICAL EXAM:  BP 134/56 mmHg  Pulse 116  Temp(Src) 99.5 F (37.5 C) (Oral)  Resp 18  Ht 5\' 9"  (1.753 m)  Wt 106.142 kg (234 lb)  BMI 34.54 kg/m2  SpO2 92%  Wt Readings from Last 3 Encounters:  08/11/14 106.142 kg (234 lb)  01/25/14 60.782 kg (134 lb)  11/10/13 100.245 kg (221 lb)            Constitutional: NAD Neck: supple, no thyromegaly Respiratory: CTA, no rales or wheezes Cardiovascular: RRR, no murmur, no gallop Abdomen: soft, good BS, nontender Extremities: trace edema Neuro: alert and oriented, no focal motor or sensory deficits  ASSESSMENT/PLAN:  Labs and imaging studies were reviewed  Add SVN's. CXR today. Wean off O2. F/u labs in AM.

## 2014-08-14 NOTE — Progress Notes (Signed)
Suppository given for constipation

## 2014-08-14 NOTE — Progress Notes (Signed)
Dr. Rudene Christians added tramadol for pain relief. Pt medicated wth one tab and then another one hour later per protocol. Pt states feels pain more intensely today than yesterday. Pt for discharge possibly to [facility] tomorrow. Wife at bedside this afternoon. Pt. Has had no further orthostatic hypotensive espisodes.

## 2014-08-14 NOTE — Clinical Social Work Placement (Signed)
   CLINICAL SOCIAL WORK PLACEMENT  NOTE  Date:  08/14/2014  Patient Details  Name: Dean Collins MRN: [id number] Date of Birth: 03-08-1941  Clinical Social Work is seeking post-discharge placement for this patient at the [place] level of care (*CSW will initial, date and re-position this form in  chart as items are completed):  Yes   Patient/family provided with [facility] Work Department's list of facilities offering this level of care within the geographic area requested by the patient (or if unable, by the patient's family).  Yes   Patient/family informed of their freedom to choose among providers that offer the needed level of care, that participate in Medicare, Medicaid or managed care program needed by the patient, have an available bed and are willing to accept the patient.  Yes   Patient/family informed of [facility]'s ownership interest in [facility] and [facility], as well as of the fact that they are under no obligation to receive care at these facilities.  PASRR submitted to EDS on 08/14/14     PASRR number received on 08/14/14     Existing PASRR number confirmed on       FL2 transmitted to all facilities in geographic area requested by pt/family on 08/07/14     FL2 transmitted to all facilities within larger geographic area on       Patient informed that his/her managed care company has contracts with or will negotiate with certain facilities, including the following:            Patient/family informed of bed offers received.  Patient chooses bed at       Physician recommends and patient chooses bed at      Patient to be transferred to   on  .  Patient to be transferred to facility by       Patient family notified on   of transfer.  Name of family member notified:        PHYSICIAN Please sign FL2     Additional Comment:    _______________________________________________ Loralyn Freshwater, LCSW 08/14/2014, 4:44  PM

## 2014-08-14 NOTE — Progress Notes (Signed)
Physical Therapy Treatment Patient Details Name: Dean Collins MRN: [id number] DOB: Nov 08, 1940 Today's Date: 08/14/2014    History of Present Illness R total hip replacement (anterior approach)    PT Comments    Pt with increased fatigue/lethargy this p.m. Continued complaints of diaphoresis and general malaise. Limited session to bed exercises.   Follow Up Recommendations        Equipment Recommendations       Recommendations for Other Services       Precautions / Restrictions Precautions Precautions: Anterior Hip Restrictions Weight Bearing Restrictions: Yes RLE Weight Bearing: Weight bearing as tolerated    Mobility  Bed Mobility                  Transfers                    Ambulation/Gait                 Stairs            Wheelchair Mobility    Modified Rankin (Stroke Patients Only)       Balance                                    Cognition Arousal/Alertness: Lethargic Behavior During Therapy: WFL for tasks assessed/performed Overall Cognitive Status: Within Functional Limits for tasks assessed                      Exercises Total Joint Exercises Ankle Circles/Pumps: AROM;10 reps;Both;Supine Quad Sets: AROM;10 reps;Both;Supine Gluteal Sets: AROM;10 reps;Both;Supine Towel Squeeze: AROM;10 reps;Supine;Both Short Arc Quad: AROM;10 reps;Supine;Right Heel Slides: AROM;10 reps;Right;Supine Hip ABduction/ADduction: AROM;10 reps;Right;Supine    General Comments        Pertinent Vitals/Pain Pain Assessment: 0-10 Pain Score: 4  Pain Location: R hip Pain Descriptors / Indicators: Sore Pain Intervention(s): Limited activity within patient's tolerance;Premedicated before session;Monitored during session    Home Living Family/patient expects to be discharged to:: Private residence Living Arrangements: Spouse/significant other Available Help at Discharge: Family Type of Home: House Home Access:  Stairs to enter   Home Layout: One level Home Equipment: Environmental consultant - 2 wheels Additional Comments: rec pt use a BSC over toilet but he prefers to use a toilet riser    Prior Function Level of Independence: Independent      Comments: 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   PT Goals (current goals can now be found in the care plan section) Acute Rehab PT Goals Patient Stated Goal: to get my blood pressure under control Progress towards PT goals: Progressing toward goals    Frequency  BID    PT Plan Current plan remains appropriate    Co-evaluation             End of Session Equipment Utilized During Treatment: Oxygen Activity Tolerance: Patient tolerated treatment well;Patient limited by lethargy Patient left: in bed;with call bell/phone within reach;with bed alarm set     Time: 4709-6283 PT Time Calculation (min) (ACUTE ONLY): 13 min  Charges:  $Therapeutic Exercise: 8-22 mins                    G Codes:      Dean Collins 08/14/2014, 3:34 PM

## 2014-08-15 ENCOUNTER — Encounter
Admission: RE | Admit: 2014-08-15 | Discharge: 2014-08-15 | Disposition: A | Discharge status: Home / Self Care | Payer: Medicare Other | Source: Ambulatory Visit | Attending: Internal Medicine | Admitting: Internal Medicine

## 2014-08-15 LAB — BASIC METABOLIC PANEL
ANION GAP: 4 — AB (ref 5–15)
BUN: 17 mg/dL (ref 6–20)
CALCIUM: 8.2 mg/dL — AB (ref 8.9–10.3)
CO2: 30 mmol/L (ref 22–32)
Chloride: 102 mmol/L (ref 101–111)
Creatinine, Ser: 1 mg/dL (ref 0.61–1.24)
GFR calc Af Amer: >60 mL/min (ref >60)
GFR calc non Af Amer: >60 mL/min (ref >60)
Glucose, Bld: 184 mg/dL — ABNORMAL HIGH (ref 65–99)
Potassium: 4.7 mmol/L (ref 3.5–5.1)
Sodium: 136 mmol/L (ref 135–145)

## 2014-08-15 LAB — CBC WITH DIFFERENTIAL/PLATELET
BASOS ABS: 0.2 10*3/uL — AB (ref 0–0.1)
Basophils Relative: 1 %
EOS ABS: 0.2 10*3/uL (ref 0–0.7)
Eosinophils Relative: 1 %
HCT: 32.1 % — ABNORMAL LOW (ref 40.0–52.0)
Hemoglobin: 10.4 g/dL — ABNORMAL LOW (ref 13.0–18.0)
LYMPHS ABS: 17.6 10*3/uL — AB (ref 1.0–3.6)
Lymphocytes Relative: 78 %
MCH: 30.8 pg (ref 26.0–34.0)
MCHC: 32.4 g/dL (ref 32.0–36.0)
MCV: 95.1 fL (ref 80.0–100.0)
MONOS PCT: 6 %
Monocytes Absolute: 1.3 10*3/uL — ABNORMAL HIGH (ref 0.2–1.0)
NEUTROS PCT: 14 %
Neutro Abs: 3.1 10*3/uL (ref 1.4–6.5)
Platelets: 85 10*3/uL — ABNORMAL LOW (ref 150–440)
RBC: 3.37 MIL/uL — ABNORMAL LOW (ref 4.40–5.90)
RDW: 15.2 % — ABNORMAL HIGH (ref 11.5–14.5)
WBC: 22.4 10*3/uL — ABNORMAL HIGH (ref 3.8–10.6)

## 2014-08-15 MED ORDER — ALUM & MAG HYDROXIDE-SIMETH 200-200-20 MG/5ML PO SUSP: 30.0000 mL | ORAL | Status: DC | PRN

## 2014-08-15 MED ORDER — TRAMADOL HCL 50 MG PO TABS: 100.0000 mg | ORAL_TABLET | Freq: Four times a day (QID) | ORAL | Status: DC | PRN

## 2014-08-15 MED ORDER — DOCUSATE SODIUM 100 MG PO CAPS: 100.0000 mg | ORAL_CAPSULE | Freq: Two times a day (BID) | ORAL | Status: DC

## 2014-08-15 MED ORDER — ASPIRIN 325 MG PO TBEC: 325.0000 mg | DELAYED_RELEASE_TABLET | Freq: Every day | ORAL | Status: DC

## 2014-08-15 MED ORDER — IPRATROPIUM-ALBUTEROL 0.5-2.5 (3) MG/3ML IN SOLN
3.0000 mL | Freq: Three times a day (TID) | RESPIRATORY_TRACT | Status: DC
  Filled 2014-08-15: qty 3

## 2014-08-15 MED ORDER — BISACODYL 10 MG RE SUPP: 10.0000 mg | Freq: Every day | RECTAL | Status: DC | PRN

## 2014-08-15 MED ORDER — GUAIFENESIN ER 600 MG PO TB12: 600.0000 mg | ORAL_TABLET | Freq: Two times a day (BID) | ORAL | Status: DC

## 2014-08-15 MED ORDER — HEPARIN SOD (PORK) LOCK FLUSH 100 UNIT/ML IV SOLN
500.0000 [IU] | Freq: Once | INTRAVENOUS | Status: AC
  Administered 2014-08-15: 500 [IU] via INTRAVENOUS
  Filled 2014-08-15: qty 5

## 2014-08-15 MED ORDER — OXYCODONE HCL 5 MG PO TABS: 5.0000 mg | ORAL_TABLET | ORAL | Status: DC | PRN

## 2014-08-15 MED ORDER — MENTHOL 3 MG MT LOZG: 1.0000 | LOZENGE | OROMUCOSAL | Status: DC | PRN

## 2014-08-15 MED ORDER — AMOXICILLIN-POT CLAVULANATE 875-125 MG PO TABS: 1.0000 | ORAL_TABLET | Freq: Two times a day (BID) | ORAL | Status: DC

## 2014-08-15 MED ORDER — ACETAMINOPHEN 325 MG PO TABS: 650.0000 mg | ORAL_TABLET | Freq: Four times a day (QID) | ORAL | Status: DC | PRN

## 2014-08-15 MED ORDER — ONDANSETRON HCL 4 MG PO TABS: 4.0000 mg | ORAL_TABLET | Freq: Four times a day (QID) | ORAL | Status: DC | PRN

## 2014-08-15 NOTE — Progress Notes (Signed)
Clinical Education officer, museum (CSW) presented bed offers to patient. He chose [facility] Patient is medically stable for D/C to [facility] today. Per Kim admissions coordinator at [facility] patient is going to room 215-B. RN will call report at [phone] and arrange EMS for transport. CSW prepared D/C packet and sent D/C summary to Kim via carefinder. Patient's wife [name] is at bedside and aware of above. Please reconsult if future social work needs arise. CSW signing off.   Blima Rich, [place] [phone]

## 2014-08-15 NOTE — Progress Notes (Signed)
  Subjective: 3 Days Post-Op Procedure(s) (LRB): TOTAL HIP ARTHROPLASTY ANTERIOR APPROACH (Right) Patient reports pain as mild.   Patient seen in rounds with Dr. Rudene Christians. Patient is well, and has had no acute complaints or problems Plan is to go Rehab after hospital stay. Negative for chest pain and shortness of breath Fever: no Gastrointestinal:negative for nausea and vomiting  Objective: Vital signs in last 24 hours: Temp:  [97.7 F (36.5 C)-98.1 F (36.7 C)] 97.7 F (36.5 C) (05/24 0342) Pulse Rate:  [53-101] 81 (05/24 0342) Resp:  [18] 18 (05/24 0342) BP: (89-144)/(45-71) 120/71 mmHg (05/24 0342) SpO2:  [93 %-97 %] 97 % (05/24 0342)  Intake/Output from previous day:  Intake/Output Summary (Last 24 hours) at 08/15/14 0607 Last data filed at 08/15/14 0123  Gross per 24 hour  Intake    600 ml  Output   1051 ml  Net   -451 ml    Intake/Output this shift: Total I/O In: -  Out: 550 [Urine:550]  Labs:  Recent Labs  08/13/14 0515 08/14/14 0440  HGB 11.2* 10.9*    Recent Labs  08/13/14 0515 08/14/14 0440  WBC 40.3* 37.4*  RBC 3.62* 3.55*  HCT 34.7* 34.0*  PLT 83* 94*    Recent Labs  08/13/14 0515 08/14/14 0440  NA 139 137  K 5.1 4.9  CL 106 103  CO2 27 30  BUN 18 19  CREATININE 1.05 1.13  GLUCOSE 168* 169*  CALCIUM 8.2* 8.1*   No results for input(s): LABPT, INR in the last 72 hours.   EXAM General - Patient is Alert and Oriented Extremity - Neurovascular intact Dorsiflexion/Plantar flexion intact Dressing/Incision - blood tinged drainage Motor Function - intact, moving foot and toes well on exam. Ambulated minimally with PT  Past Medical History  Diagnosis Date  . Incontinence   . Arthritis   . Cancer     CLL prostate  . Melanoma     scalp  . Diverticulitis   . CLL (chronic lymphocytic leukemia) 2003  . Hypothyroidism   . Anxiety   . Medical history non-contributory     Assessment/Plan: 3 Days Post-Op Procedure(s) (LRB): TOTAL  HIP ARTHROPLASTY ANTERIOR APPROACH (Right) Active Problems:   Femoral neck fracture  Estimated body mass index is 34.54 kg/(m^2) as calculated from the following:   Height as of this encounter: 5\' 9"  (1.753 m).   Weight as of this encounter: 106.142 kg (234 lb). Discharge to SNF  DVT Prophylaxis - Aspirin, Foot Pumps and TED hose Weight-Bearing as tolerated to Right leg  Reche Dixon, PA-C Orthopaedic Surgery 08/15/2014, 6:07 AM

## 2014-08-15 NOTE — Progress Notes (Signed)
Physical Therapy Treatment Patient Details Name: Dean Collins MRN: [id number] DOB: 05/08/1940 Today's Date: 08/15/2014    History of Present Illness R total hip replacement (anterior approach)    PT Comments    Pt improving with ambulation quality and distance. Continues to needs assist and cueing for technique. Good understanding of exercises with cueing. Transfers continue to require cueing for hand placement. Pt notes his rolling walker is too uncomfortable especially hand grips and is wearing out. Pt received in 2005. Assess pt's need for rolling walker post short term stay to provide pt with appropriate assistive device at that time.   Follow Up Recommendations  SNF     Equipment Recommendations       Recommendations for Other Services       Precautions / Restrictions Precautions Precautions: Anterior Hip Restrictions Weight Bearing Restrictions: Yes RLE Weight Bearing: Weight bearing as tolerated    Mobility  Bed Mobility Overal bed mobility: Needs Assistance Bed Mobility: Supine to Sit     Supine to sit: Min assist     General bed mobility comments: Min A for movement of RLE over edge of bed; from there modified independent with increased time/effort.   Transfers Overall transfer level: Modified independent Equipment used: Rolling walker (2 wheeled) Transfers: Sit to/from Stand Sit to Stand: Supervision (cues for hand placement)            Ambulation/Gait Ambulation/Gait assistance: Min guard Ambulation Distance (Feet): 70 Feet (Post short rest ambulates fwd/sidestep to chair x 20 ft) Assistive device: Rolling walker (2 wheeled) Gait Pattern/deviations: Step-through pattern Gait velocity:  (reduced) Gait velocity interpretation: <1.8 ft/sec, indicative of risk for recurrent falls General Gait Details: improved quality/ability to ambulate today. Asymptomatic of low BP. BP taken this a.m WNL    Stairs            Wheelchair Mobility     Modified Rankin (Stroke Patients Only)       Balance                                    Cognition Arousal/Alertness: Awake/alert Behavior During Therapy: WFL for tasks assessed/performed Overall Cognitive Status: Within Functional Limits for tasks assessed                      Exercises Total Joint Exercises Ankle Circles/Pumps: AROM;10 reps;Both;Supine Quad Sets: AROM;10 reps;Both;Supine Gluteal Sets: AROM;10 reps;Both;Supine Towel Squeeze: AROM;10 reps;Supine;Both Heel Slides: AROM;10 reps;Right;Supine Hip ABduction/ADduction: AROM;10 reps;Right;Supine Long Arc Quad: Both;10 reps;AROM;Seated Marching in Standing: AROM;Both;10 reps;Seated    General Comments        Pertinent Vitals/Pain Pain Assessment: 0-10 Pain Score: 6  Pain Location: R hip Pain Intervention(s): Limited activity within patient's tolerance;Monitored during session;Premedicated before session    Home Living                      Prior Function            PT Goals (current goals can now be found in the care plan section) Progress towards PT goals: Progressing toward goals    Frequency  BID    PT Plan Current plan remains appropriate    Co-evaluation             End of Session Equipment Utilized During Treatment: Gait belt Activity Tolerance: Patient tolerated treatment well (tolerated treatment on room air with O2 levels 93-94%) Patient  left: in chair;with call bell/phone within reach;with chair alarm set;with family/visitor present     Time: 4166-0630 PT Time Calculation (min) (ACUTE ONLY): 36 min  Charges:  $Gait Training: 8-22 mins $Therapeutic Exercise: 8-22 mins                    G Codes:      Charlaine Dalton 08/15/2014, 10:46 AM

## 2014-08-15 NOTE — Discharge Instructions (Signed)
ANTERIOR APPROACH TOTAL HIP REPLACEMENT POSTOPERATIVE DIRECTIONS   Hip Rehabilitation, Guidelines Following Surgery  The results of a hip operation are greatly improved after range of motion and muscle strengthening exercises. Follow all safety measures which are given to protect your hip. If any of these exercises cause increased pain or swelling in your joint, decrease the amount until you are comfortable again. Then slowly increase the exercises. Call your caregiver if you have problems or questions.   HOME CARE INSTRUCTIONS  Remove items at home which could result in a fall. This includes throw rugs or furniture in walking pathways.   ICE to the affected hip every three hours for 30 minutes at a time and then as needed for pain and swelling.  Continue to use ice on the hip for pain and swelling from surgery. You may notice swelling that will progress down to the foot and ankle.  This is normal after surgery.  Elevate the leg when you are not up walking on it.    Continue to use the breathing machine which will help keep your temperature down.  It is common for your temperature to cycle up and down following surgery, especially at night when you are not up moving around and exerting yourself.  The breathing machine keeps your lungs expanded and your temperature down.  Do not place pillow under knee, focus on keeping the knee straight while resting  DIET You may resume your previous home diet once your are discharged from the hospital.  DRESSING / WOUND CARE / SHOWERING You may change your dressing 3-5 days after surgery.  Then change the dressing every day with sterile gauze.  Please use good hand washing techniques before changing the dressing.  Do not use any lotions or creams on the incision until instructed by your surgeon. Keep your dressing dry with showering.  You can keep it covered and pat dry. Change the surgical dressing daily and reapply a dry dressing each time.  ACTIVITY Walk  with your walker as instructed. Use walker as long as suggested by your caregivers. Avoid periods of inactivity such as sitting longer than an hour when not asleep. This helps prevent blood clots.  You may resume a sexual relationship in one month or when given the OK by your doctor.  You may return to work once you are cleared by your doctor.  Do not drive a car for 6 weeks or until released by you surgeon.  Do not drive while taking narcotics.  WEIGHT BEARING Weight bearing as tolerated with assist device (walker, cane, etc) as directed, use it as long as suggested by your surgeon or therapist, typically at least 4-6 weeks.  POSTOPERATIVE CONSTIPATION PROTOCOL Constipation - defined medically as fewer than three stools per week and severe constipation as less than one stool per week.  One of the most common issues patients have following surgery is constipation.  Even if you have a regular bowel pattern at home, your normal regimen is likely to be disrupted due to multiple reasons following surgery.  Combination of anesthesia, postoperative narcotics, change in appetite and fluid intake all can affect your bowels.  In order to avoid complications following surgery, here are some recommendations in order to help you during your recovery period.  Colace (docusate) - Pick up an over-the-counter form of Colace or another stool softener and take twice a day as long as you are requiring postoperative pain medications.  Take with a full glass of water daily.  If   you experience loose stools or diarrhea, hold the colace until you stool forms back up.  If your symptoms do not get better within 1 week or if they get worse, check with your doctor.  Dulcolax (bisacodyl) - Pick up over-the-counter and take as directed by the product packaging as needed to assist with the movement of your bowels.  Take with a full glass of water.  Use this product as needed if not relieved by Colace only.   MiraLax  (polyethylene glycol) - Pick up over-the-counter to have on hand.  MiraLax is a solution that will increase the amount of water in your bowels to assist with bowel movements.  Take as directed and can mix with a glass of water, juice, soda, coffee, or tea.  Take if you go more than two days without a movement. Do not use MiraLax more than once per day. Call your doctor if you are still constipated or irregular after using this medication for 7 days in a row.  If you continue to have problems with postoperative constipation, please contact the office for further assistance and recommendations.  If you experience "the worst abdominal pain ever" or develop nausea or vomiting, please contact the office immediatly for further recommendations for treatment.  ITCHING  If you experience itching with your medications, try taking only a single pain pill, or even half a pain pill at a time.  You can also use Benadryl over the counter for itching or also to help with sleep.   TED HOSE STOCKINGS Wear the elastic stockings on both legs for three weeks following surgery during the day but you may remove then at night for sleeping.  MEDICATIONS See your medication summary on the "After Visit Summary" that the nursing staff will review with you prior to discharge.  You may have some home medications which will be placed on hold until you complete the course of blood thinner medication.  It is important for you to complete the blood thinner medication as prescribed by your surgeon.  Continue your approved medications as instructed at time of discharge.  PRECAUTIONS If you experience chest pain or shortness of breath - call 911 immediately for transfer to the hospital emergency department.  If you develop a fever greater that 101 F, purulent drainage from wound, increased redness or drainage from wound, foul odor from the wound/dressing, or calf pain - CONTACT YOUR SURGEON.                                                    FOLLOW-UP APPOINTMENTS Make sure you keep all of your appointments after your operation with your surgeon and caregivers. You should call the office at the above phone number and make an appointment for approximately two weeks after the date of your surgery or on the date instructed by your surgeon outlined in the "After Visit Summary".  RANGE OF MOTION AND STRENGTHENING EXERCISES  These exercises are designed to help you keep full movement of your hip joint. Follow your caregiver's or physical therapist's instructions. Perform all exercises about fifteen times, three times per day or as directed. Exercise both hips, even if you have had only one joint replacement. These exercises can be done on a training (exercise) mat, on the floor, on a table or on a bed. Use whatever works the best and   is most comfortable for you. Use music or television while you are exercising so that the exercises are a pleasant break in your day. This will make your life better with the exercises acting as a break in routine you can look forward to.  Lying on your back, slowly slide your foot toward your buttocks, raising your knee up off the floor. Then slowly slide your foot back down until your leg is straight again.  Lying on your back spread your legs as far apart as you can without causing discomfort.  Lying on your side, raise your upper leg and foot straight up from the floor as far as is comfortable. Slowly lower the leg and repeat.  Lying on your back, tighten up the muscle in the front of your thigh (quadriceps muscles). You can do this by keeping your leg straight and trying to raise your heel off the floor. This helps strengthen the largest muscle supporting your knee.  Lying on your back, tighten up the muscles of your buttocks both with the legs straight and with the knee bent at a comfortable angle while keeping your heel on the floor.   IF YOU ARE TRANSFERRED TO A SKILLED REHAB FACILITY If the patient is  transferred to a skilled rehab facility following release from the hospital, a list of the current medications will be sent to the facility for the patient to continue.  When discharged from the skilled rehab facility, please have the facility set up the patient's Home Health Physical Therapy prior to being released. Also, the skilled facility will be responsible for providing the patient with their medications at time of release from the facility to include their pain medication, the muscle relaxants, and their blood thinner medication. If the patient is still at the rehab facility at time of the two week follow up appointment, the skilled rehab facility will also need to assist the patient in arranging follow up appointment in our office and any transportation needs.  MAKE SURE YOU:  Understand these instructions.  Get help right away if you are not doing well or get worse.    Pick up stool softner and laxative for home use following surgery while on pain medications. Do not submerge incision under water. Please use good hand washing techniques while changing dressing each day. May shower starting three days after surgery. Please use a clean towel to pat the incision dry following showers. Continue to use ice for pain and swelling after surgery. Do not use any lotions or creams on the incision until instructed by your surgeon. 

## 2014-08-15 NOTE — Clinical Social Work Placement (Signed)
   CLINICAL SOCIAL WORK PLACEMENT  NOTE  Date:  08/15/2014  Patient Details  Name: Dean Collins MRN: [id number] Date of Birth: 1940/11/22  Clinical Social Work is seeking post-discharge placement for this patient at the [place] level of care (*CSW will initial, date and re-position this form in  chart as items are completed):  Yes   Patient/family provided with [place] Work Department's list of facilities offering this level of care within the geographic area requested by the patient (or if unable, by the patient's family).  Yes   Patient/family informed of their freedom to choose among providers that offer the needed level of care, that participate in Medicare, Medicaid or managed care program needed by the patient, have an available bed and are willing to accept the patient.  Yes   Patient/family informed of [facility]'s ownership interest in [facility] and [facility], as well as of the fact that they are under no obligation to receive care at these facilities.  PASRR submitted to EDS on 08/14/14     PASRR number received on 08/14/14     Existing PASRR number confirmed on       FL2 transmitted to all facilities in geographic area requested by pt/family on 08/07/14     FL2 transmitted to all facilities within larger geographic area on       Patient informed that his/her managed care company has contracts with or will negotiate with certain facilities, including the following:        Yes   Patient/family informed of bed offers received.  Patient chooses bed at  [facility] )     Physician recommends and patient chooses bed at      Patient to be transferred to  [facility] ) on 08/15/14.  Patient to be transferred to facility by  (EMS )     Patient family notified on 08/15/14 of transfer.  Name of family member notified:   (Wife Judy at bedside )     PHYSICIAN Please sign FL2     Additional Comment:     _______________________________________________ Loralyn Freshwater, LCSW 08/15/2014, 11:24 AM

## 2014-08-15 NOTE — Discharge Summary (Signed)
Physician Discharge Summary  Patient ID: Dean Collins MRN: [id number] DOB/AGE: 12-09-1940 74 y.o.  Admit date: 08/11/2014 Discharge date: 08/15/2014  Admission Diagnoses:  Discharge Diagnoses:  Active Problems:   Femoral neck fracture   Discharged Condition: fair  Hospital Course: Patient was admitted for a right femoral neck fracture. The patient had a right anterior hip replacement done 3 days ago. He did well after surgery. The patient ambulated with physical therapy short distances, but his blood pressure prevented him from ambulating well. The patient was unable to go home because of difficulty with ambulation and stairs. He was managed by internal medicine for his blood pressure which continue to stabilize before discharge. The patient was ready to go to rehabilitation for physical therapy and continued occupational therapy.  Consults: Internal medicine  Significant Diagnostic Studies: radiology: CXR: normal and X-Ray: A right femoral neck fracture.  Treatments: surgery: Right anterior hip replacement.  Discharge Exam: Blood pressure 120/71, pulse 81, temperature 97.7 F (36.5 C), temperature source Oral, resp. rate 18, height 5\' 9"  (1.753 m), weight 106.142 kg (234 lb), SpO2 97 %. Incision/Wound: right anterior hip incision showed mild drainage. A new dressing was applied.  Disposition:  skilled nursing facility  Discharge Instructions    Incentive spirometry RT    Complete by:  As directed      Place TED hose    Complete by:  As directed             Medication List    TAKE these medications        acetaminophen 325 MG tablet  Commonly known as:  TYLENOL  Take 2 tablets (650 mg total) by mouth every 6 (six) hours as needed for mild pain (or Fever >/= 101).     alum & mag hydroxide-simeth 200-200-20 MG/5ML suspension  Commonly known as:  MAALOX/MYLANTA  Take 30 mLs by mouth every 4 (four) hours as needed for indigestion.     amoxicillin-clavulanate 875-125 MG  per tablet  Commonly known as:  AUGMENTIN  Take 1 tablet by mouth every 12 (twelve) hours.     aspirin 325 MG EC tablet  Take 1 tablet (325 mg total) by mouth daily with breakfast.     bisacodyl 10 MG suppository  Commonly known as:  DULCOLAX  Place 1 suppository (10 mg total) rectally daily as needed for moderate constipation.     docusate sodium 100 MG capsule  Commonly known as:  COLACE  Take 1 capsule (100 mg total) by mouth 2 (two) times daily.     guaiFENesin 600 MG 12 hr tablet  Commonly known as:  MUCINEX  Take 1 tablet (600 mg total) by mouth 2 (two) times daily.     levothyroxine 50 MCG tablet  Commonly known as:  SYNTHROID, LEVOTHROID  Take 50 mcg by mouth daily before breakfast.     levothyroxine 75 MCG tablet  Commonly known as:  SYNTHROID, LEVOTHROID  Take 75 mcg by mouth daily before breakfast.     menthol-cetylpyridinium 3 MG lozenge  Commonly known as:  CEPACOL  Take 1 lozenge (3 mg total) by mouth as needed for sore throat (sore throat).     ondansetron 4 MG tablet  Commonly known as:  ZOFRAN  Take 1 tablet (4 mg total) by mouth every 6 (six) hours as needed for nausea.     oxyCODONE 5 MG immediate release tablet  Commonly known as:  Oxy IR/ROXICODONE  Take 1-2 tablets (5-10 mg total) by mouth every 4 (  four) hours as needed for moderate pain or breakthrough pain.     sertraline 50 MG tablet  Commonly known as:  ZOLOFT  Take 50 mg by mouth at bedtime.     simvastatin 40 MG tablet  Commonly known as:  ZOCOR  Take 40 mg by mouth daily at 6 PM.     traMADol 50 MG tablet  Commonly known as:  ULTRAM  Take 2 tablets (100 mg total) by mouth every 6 (six) hours as needed for moderate pain (alternate with oxycodone).         SignedPrescott Collins, Dean Collins 08/15/2014, 6:20 AM

## 2014-08-15 NOTE — Progress Notes (Signed)
Called report to Yves Dill, RN at [facility]. Pts VSS. Portacath deaccessed. Pt waiting on transportation by EMS.

## 2014-08-15 NOTE — Progress Notes (Signed)
Dean Collins is a 74 y.o. male  Hip fx, CLL  SUBJECTIVE:  Pt in NAD. Denies Cp or SOB. Working with PT. Labs stable. Afebrile.  ______________________________________________________________________  ROS: Review of systems is unremarkable for any active cardiac,respiratory, GI, GU, hematologic, neurologic or psychiatric systems, 10 systems reviewed.  @CMEDLIST @  Past Medical History  Diagnosis Date  . Incontinence   . Arthritis   . Cancer     CLL prostate  . Melanoma     scalp  . Diverticulitis   . CLL (chronic lymphocytic leukemia) 2003  . Hypothyroidism   . Anxiety   . Medical history non-contributory     Past Surgical History  Procedure Laterality Date  . Replacement total knee      x2  . Prostatectomy  1993  . Colonoscopy  2013-2014?  Marland Kitchen Total hip arthroplasty Right 08/12/2014    Procedure: TOTAL HIP ARTHROPLASTY ANTERIOR APPROACH;  Surgeon: Hessie Knows, MD;  Location: [facility] ORS;  Service: Orthopedics;  Laterality: Right;    PHYSICAL EXAM:  BP 120/71 mmHg  Pulse 81  Temp(Src) 97.7 F (36.5 C) (Oral)  Resp 18  Ht 5\' 9"  (1.753 m)  Wt 106.142 kg (234 lb)  BMI 34.54 kg/m2  SpO2 97%  Wt Readings from Last 3 Encounters:  08/11/14 106.142 kg (234 lb)  01/25/14 60.782 kg (134 lb)  11/10/13 100.245 kg (221 lb)            Constitutional: NAD Neck: supple, no thyromegaly Respiratory: CTA, no rales or wheezes Cardiovascular: RRR, no murmur, no gallop Abdomen: soft, good BS, nontender Extremities: no edema Neuro: alert and oriented, no focal motor or sensory deficits  ASSESSMENT/PLAN:  Labs and imaging studies were reviewed  No change in care. Agree with D/C to SNF. Needs f/u with me in office in 2 weeks. Call if questions arise.

## 2014-08-15 NOTE — Progress Notes (Signed)
Pt. Alert and oriented. VSS. Pain to right hip controlled with PO pain meds. Using urinal. Dressing clean dry and intact. Neurochecks WDL. Rested quietly throughout the night.

## 2014-08-16 ENCOUNTER — Other Ambulatory Visit: Payer: Self-pay

## 2014-08-16 ENCOUNTER — Telehealth: Payer: Self-pay | Admitting: *Deleted

## 2014-08-16 LAB — SURGICAL PATHOLOGY

## 2014-08-16 NOTE — Telephone Encounter (Signed)
Dean Collins called and spoke with Dean Collins and told her no need to have labs drawn since they were just drawn in hospital yesterday

## 2014-08-17 ENCOUNTER — Other Ambulatory Visit: Payer: Self-pay

## 2014-08-23 ENCOUNTER — Encounter
Admission: RE | Admit: 2014-08-23 | Discharge: 2014-08-23 | Disposition: A | Discharge status: Home / Self Care | Payer: Medicare Other | Source: Ambulatory Visit | Attending: Internal Medicine | Admitting: Internal Medicine

## 2014-08-29 ENCOUNTER — Other Ambulatory Visit: Payer: Self-pay

## 2014-08-29 DIAGNOSIS — C9110 Chronic lymphocytic leukemia of B-cell type not having achieved remission: Secondary | ICD-10-CM

## 2014-08-30 ENCOUNTER — Inpatient Hospital Stay: Payer: Medicare Other

## 2014-08-30 ENCOUNTER — Inpatient Hospital Stay: Payer: Medicare Other | Attending: Internal Medicine

## 2014-08-30 VITALS — BP 121/79 | HR 69 | Temp 96.8°F

## 2014-08-30 DIAGNOSIS — Z79899 Other long term (current) drug therapy: Secondary | ICD-10-CM | POA: Insufficient documentation

## 2014-08-30 DIAGNOSIS — G629 Polyneuropathy, unspecified: Secondary | ICD-10-CM | POA: Insufficient documentation

## 2014-08-30 DIAGNOSIS — D696 Thrombocytopenia, unspecified: Secondary | ICD-10-CM | POA: Diagnosis not present

## 2014-08-30 DIAGNOSIS — Z5111 Encounter for antineoplastic chemotherapy: Secondary | ICD-10-CM | POA: Insufficient documentation

## 2014-08-30 DIAGNOSIS — Z96649 Presence of unspecified artificial hip joint: Secondary | ICD-10-CM

## 2014-08-30 DIAGNOSIS — C9110 Chronic lymphocytic leukemia of B-cell type not having achieved remission: Secondary | ICD-10-CM | POA: Diagnosis not present

## 2014-08-30 LAB — CBC WITH DIFFERENTIAL/PLATELET
Basophils Absolute: 0.2 10*3/uL — ABNORMAL HIGH (ref 0–0.1)
Basophils Relative: 0 %
EOS ABS: 0.4 10*3/uL (ref 0–0.7)
Eosinophils Relative: 1 %
HCT: 35.2 % — ABNORMAL LOW (ref 40.0–52.0)
HEMOGLOBIN: 11.2 g/dL — AB (ref 13.0–18.0)
Lymphocytes Relative: 96 %
Lymphs Abs: 38.4 10*3/uL — ABNORMAL HIGH (ref 1.0–3.6)
MCH: 29.8 pg (ref 26.0–34.0)
MCHC: 31.7 g/dL — ABNORMAL LOW (ref 32.0–36.0)
MCV: 93.8 fL (ref 80.0–100.0)
MONO ABS: 0.1 10*3/uL — AB (ref 0.2–1.0)
Monocytes Relative: 0 %
NEUTROS ABS: 1.4 10*3/uL (ref 1.4–6.5)
NEUTROS PCT: 3 %
Platelets: 166 10*3/uL (ref 150–440)
RBC: 3.75 MIL/uL — AB (ref 4.40–5.90)
RDW: 15.2 % — ABNORMAL HIGH (ref 11.5–14.5)
WBC: 40.4 10*3/uL — ABNORMAL HIGH (ref 3.8–10.6)

## 2014-08-30 LAB — CREATININE, SERUM
Creatinine, Ser: 1.25 mg/dL — ABNORMAL HIGH (ref 0.61–1.24)
GFR calc non Af Amer: 55 mL/min — ABNORMAL LOW (ref >60)

## 2014-08-30 LAB — URIC ACID: Uric Acid, Serum: 5.8 mg/dL (ref 4.4–7.6)

## 2014-08-30 LAB — LACTATE DEHYDROGENASE: LDH: 262 U/L — ABNORMAL HIGH (ref 98–192)

## 2014-08-30 MED ORDER — SODIUM CHLORIDE 0.9 % IV SOLN: 375.0000 mg/m2 | Freq: Once | INTRAVENOUS | Status: DC

## 2014-08-30 MED ORDER — HEPARIN SOD (PORK) LOCK FLUSH 100 UNIT/ML IV SOLN
500.0000 [IU] | Freq: Once | INTRAVENOUS | Status: AC | PRN
  Administered 2014-08-30: 500 [IU]
  Filled 2014-08-30: qty 5

## 2014-08-30 MED ORDER — SODIUM CHLORIDE 0.9 % IV SOLN
Freq: Once | INTRAVENOUS | Status: AC
  Administered 2014-08-30: 11:00:00 via INTRAVENOUS
  Filled 2014-08-30: qty 1000

## 2014-08-30 MED ORDER — ACETAMINOPHEN 325 MG PO TABS
650.0000 mg | ORAL_TABLET | Freq: Once | ORAL | Status: AC
  Administered 2014-08-30: 650 mg via ORAL
  Filled 2014-08-30: qty 2

## 2014-08-30 MED ORDER — SODIUM CHLORIDE 0.9 % IV SOLN
900.0000 mg | Freq: Once | INTRAVENOUS | Status: AC
  Administered 2014-08-30: 900 mg via INTRAVENOUS
  Filled 2014-08-30: qty 80

## 2014-08-30 MED ORDER — DIPHENHYDRAMINE HCL 50 MG/ML IJ SOLN
25.0000 mg | Freq: Once | INTRAMUSCULAR | Status: AC
  Administered 2014-08-30: 25 mg via INTRAVENOUS
  Filled 2014-08-30: qty 1

## 2014-08-30 MED ORDER — SODIUM CHLORIDE 0.9 % IV SOLN
10.0000 mg | Freq: Once | INTRAVENOUS | Status: AC
  Administered 2014-08-30: 10 mg via INTRAVENOUS
  Filled 2014-08-30: qty 1

## 2014-10-11 ENCOUNTER — Other Ambulatory Visit: Payer: Self-pay

## 2014-10-13 ENCOUNTER — Inpatient Hospital Stay: Payer: Medicare Other | Attending: Internal Medicine

## 2014-10-13 DIAGNOSIS — C801 Malignant (primary) neoplasm, unspecified: Secondary | ICD-10-CM

## 2014-10-13 DIAGNOSIS — C9110 Chronic lymphocytic leukemia of B-cell type not having achieved remission: Secondary | ICD-10-CM | POA: Diagnosis present

## 2014-10-13 DIAGNOSIS — Z452 Encounter for adjustment and management of vascular access device: Secondary | ICD-10-CM | POA: Insufficient documentation

## 2014-10-13 MED ORDER — SODIUM CHLORIDE 0.9 % IJ SOLN
10.0000 mL | Freq: Once | INTRAMUSCULAR | Status: AC
  Administered 2014-10-13: 10 mL via INTRAVENOUS
  Filled 2014-10-13: qty 10

## 2014-10-13 MED ORDER — HEPARIN SOD (PORK) LOCK FLUSH 100 UNIT/ML IV SOLN
500.0000 [IU] | Freq: Once | INTRAVENOUS | Status: AC
  Administered 2014-10-13: 500 [IU] via INTRAVENOUS
  Filled 2014-10-13: qty 5

## 2014-10-23 HISTORY — PX: MOHS SURGERY: SUR867

## 2014-10-25 ENCOUNTER — Ambulatory Visit: Payer: Medicare Other | Admitting: Internal Medicine

## 2014-10-25 ENCOUNTER — Ambulatory Visit: Payer: Medicare Other

## 2014-10-25 ENCOUNTER — Other Ambulatory Visit: Payer: Medicare Other

## 2014-11-02 ENCOUNTER — Other Ambulatory Visit: Payer: Self-pay | Admitting: *Deleted

## 2014-11-02 DIAGNOSIS — C9110 Chronic lymphocytic leukemia of B-cell type not having achieved remission: Secondary | ICD-10-CM

## 2014-11-03 ENCOUNTER — Inpatient Hospital Stay (HOSPITAL_BASED_OUTPATIENT_CLINIC_OR_DEPARTMENT_OTHER): Payer: Medicare Other | Admitting: Internal Medicine

## 2014-11-03 ENCOUNTER — Inpatient Hospital Stay: Payer: Medicare Other | Attending: Internal Medicine

## 2014-11-03 ENCOUNTER — Encounter (INDEPENDENT_AMBULATORY_CARE_PROVIDER_SITE_OTHER): Payer: Self-pay

## 2014-11-03 ENCOUNTER — Inpatient Hospital Stay: Payer: Medicare Other

## 2014-11-03 VITALS — BP 155/85 | HR 71 | Temp 98.2°F | Resp 18 | Ht 69.0 in | Wt 239.0 lb

## 2014-11-03 DIAGNOSIS — Z8546 Personal history of malignant neoplasm of prostate: Secondary | ICD-10-CM

## 2014-11-03 DIAGNOSIS — G629 Polyneuropathy, unspecified: Secondary | ICD-10-CM

## 2014-11-03 DIAGNOSIS — Z79899 Other long term (current) drug therapy: Secondary | ICD-10-CM | POA: Diagnosis not present

## 2014-11-03 DIAGNOSIS — D589 Hereditary hemolytic anemia, unspecified: Secondary | ICD-10-CM | POA: Diagnosis not present

## 2014-11-03 DIAGNOSIS — D6959 Other secondary thrombocytopenia: Secondary | ICD-10-CM | POA: Insufficient documentation

## 2014-11-03 DIAGNOSIS — C9110 Chronic lymphocytic leukemia of B-cell type not having achieved remission: Secondary | ICD-10-CM

## 2014-11-03 DIAGNOSIS — Z87891 Personal history of nicotine dependence: Secondary | ICD-10-CM | POA: Diagnosis not present

## 2014-11-03 DIAGNOSIS — Z85820 Personal history of malignant melanoma of skin: Secondary | ICD-10-CM | POA: Insufficient documentation

## 2014-11-03 DIAGNOSIS — D72820 Lymphocytosis (symptomatic): Secondary | ICD-10-CM

## 2014-11-03 LAB — CBC WITH DIFFERENTIAL/PLATELET
Basophils Absolute: 0.4 10*3/uL — ABNORMAL HIGH (ref 0–0.1)
Basophils Relative: 1 %
EOS ABS: 0.4 10*3/uL (ref 0–0.7)
HEMATOCRIT: 41.3 % (ref 40.0–52.0)
Hemoglobin: 12.7 g/dL — ABNORMAL LOW (ref 13.0–18.0)
Lymphs Abs: 61.6 10*3/uL — ABNORMAL HIGH (ref 1.0–3.6)
MCH: 29.1 pg (ref 26.0–34.0)
MCHC: 30.8 g/dL — ABNORMAL LOW (ref 32.0–36.0)
MCV: 94.5 fL (ref 80.0–100.0)
Monocytes Absolute: 0.4 10*3/uL (ref 0.2–1.0)
Monocytes Relative: 1 %
Neutro Abs: 3.8 10*3/uL (ref 1.4–6.5)
Neutrophils Relative %: 5 %
Platelets: 69 10*3/uL — ABNORMAL LOW (ref 150–440)
RBC: 4.37 MIL/uL — ABNORMAL LOW (ref 4.40–5.90)
RDW: 17.4 % — AB (ref 11.5–14.5)
WBC: 66.6 10*3/uL (ref 3.8–10.6)

## 2014-11-03 LAB — HEPATIC FUNCTION PANEL
ALBUMIN: 4.2 g/dL (ref 3.5–5.0)
ALK PHOS: 93 U/L (ref 38–126)
ALT: 16 U/L — ABNORMAL LOW (ref 17–63)
AST: 27 U/L (ref 15–41)
BILIRUBIN TOTAL: 0.8 mg/dL (ref 0.3–1.2)
Bilirubin, Direct: <0.1 mg/dL — ABNORMAL LOW (ref 0.1–0.5)
Total Protein: 6.2 g/dL — ABNORMAL LOW (ref 6.5–8.1)

## 2014-11-03 LAB — CREATININE, SERUM
Creatinine, Ser: 1.25 mg/dL — ABNORMAL HIGH (ref 0.61–1.24)
GFR calc non Af Amer: 55 mL/min — ABNORMAL LOW (ref >60)

## 2014-11-03 LAB — LACTATE DEHYDROGENASE: LDH: 215 U/L — ABNORMAL HIGH (ref 98–192)

## 2014-11-03 LAB — URIC ACID: Uric Acid, Serum: 7.2 mg/dL (ref 4.4–7.6)

## 2014-11-03 MED ORDER — HEPARIN SOD (PORK) LOCK FLUSH 100 UNIT/ML IV SOLN
500.0000 [IU] | Freq: Once | INTRAVENOUS | Status: AC
  Administered 2014-11-03: 500 [IU] via INTRAVENOUS

## 2014-11-03 MED ORDER — HEPARIN SOD (PORK) LOCK FLUSH 100 UNIT/ML IV SOLN
INTRAVENOUS | Status: AC
  Filled 2014-11-03: qty 5

## 2014-11-03 MED ORDER — LIDOCAINE-PRILOCAINE 2.5-2.5 % EX CREA: TOPICAL_CREAM | CUTANEOUS | Status: AC

## 2014-11-03 NOTE — Progress Notes (Signed)
Pt has port and our clinic does not have numbing spray, pt requested emla cream to be sent in and it was escribed to pharmacy

## 2014-11-03 NOTE — Progress Notes (Signed)
Patient is here for follow-up of CLL and Rituxan treatment. Patient states that overall he has been feeling pretty good. He had hip surgery in late May and recovered nicely from it.

## 2014-11-10 ENCOUNTER — Inpatient Hospital Stay: Payer: Medicare Other

## 2014-11-10 DIAGNOSIS — C9110 Chronic lymphocytic leukemia of B-cell type not having achieved remission: Secondary | ICD-10-CM | POA: Diagnosis not present

## 2014-11-10 LAB — CBC WITH DIFFERENTIAL/PLATELET
Basophils Absolute: 0.3 10*3/uL — ABNORMAL HIGH (ref 0–0.1)
Eosinophils Absolute: 0.4 10*3/uL (ref 0–0.7)
Eosinophils Relative: 0 %
HEMATOCRIT: 42 % (ref 40.0–52.0)
Hemoglobin: 12.8 g/dL — ABNORMAL LOW (ref 13.0–18.0)
Lymphocytes Relative: 94 %
Lymphs Abs: 120.6 10*3/uL — ABNORMAL HIGH (ref 1.0–3.6)
MCH: 29.3 pg (ref 26.0–34.0)
MCHC: 30.3 g/dL — ABNORMAL LOW (ref 32.0–36.0)
MCV: 96.5 fL (ref 80.0–100.0)
MONO ABS: 0.8 10*3/uL (ref 0.2–1.0)
Monocytes Relative: 1 %
NEUTROS ABS: 6.2 10*3/uL (ref 1.4–6.5)
Neutrophils Relative %: 5 %
PLATELETS: 48 10*3/uL — AB (ref 150–440)
RBC: 4.36 MIL/uL — ABNORMAL LOW (ref 4.40–5.90)
RDW: 17.3 % — AB (ref 11.5–14.5)
WBC: 128.4 10*3/uL (ref 3.8–10.6)

## 2014-11-10 LAB — SAMPLE TO BLOOD BANK

## 2014-11-17 ENCOUNTER — Inpatient Hospital Stay: Payer: Medicare Other

## 2014-11-17 DIAGNOSIS — C9110 Chronic lymphocytic leukemia of B-cell type not having achieved remission: Secondary | ICD-10-CM | POA: Diagnosis not present

## 2014-11-17 LAB — CBC WITH DIFFERENTIAL/PLATELET
BAND NEUTROPHILS: 0 % (ref 0–10)
BASOS ABS: 0 10*3/uL (ref 0.0–0.1)
Basophils Relative: 0 % (ref 0–1)
Blasts: 0 %
EOS ABS: 0 10*3/uL (ref 0.0–0.7)
EOS PCT: 0 % (ref 0–5)
HCT: 40.7 % (ref 40.0–52.0)
Hemoglobin: 12.6 g/dL — ABNORMAL LOW (ref 13.0–18.0)
LYMPHS ABS: 130 10*3/uL — AB (ref 0.7–4.0)
Lymphocytes Relative: 94 % — ABNORMAL HIGH (ref 12–46)
MCH: 29.6 pg (ref 26.0–34.0)
MCHC: 31 g/dL — AB (ref 32.0–36.0)
MCV: 95.5 fL (ref 80.0–100.0)
METAMYELOCYTES PCT: 0 %
MONOS PCT: 2 % — AB (ref 3–12)
Monocytes Absolute: 2.8 10*3/uL — ABNORMAL HIGH (ref 0.1–1.0)
Myelocytes: 0 %
NEUTROS ABS: 5.5 10*3/uL (ref 1.7–7.7)
Neutrophils Relative %: 4 % — ABNORMAL LOW (ref 43–77)
Other: 0 %
Platelets: 56 10*3/uL — ABNORMAL LOW (ref 150–440)
Promyelocytes Absolute: 0 %
RBC: 4.27 MIL/uL — ABNORMAL LOW (ref 4.40–5.90)
RDW: 16.7 % — ABNORMAL HIGH (ref 11.5–14.5)
WBC: 138.3 10*3/uL (ref 3.8–10.6)
nRBC: 0 /100 WBC

## 2014-11-17 LAB — DAT, POLYSPECIFIC AHG (ARMC ONLY): POLYSPECIFIC AHG TEST: NEGATIVE

## 2014-11-17 LAB — BASIC METABOLIC PANEL
Anion gap: 8 (ref 5–15)
BUN: 22 mg/dL — ABNORMAL HIGH (ref 6–20)
CO2: 27 mmol/L (ref 22–32)
CREATININE: 1.1 mg/dL (ref 0.61–1.24)
Calcium: 8.8 mg/dL — ABNORMAL LOW (ref 8.9–10.3)
Chloride: 105 mmol/L (ref 101–111)
GFR calc non Af Amer: >60 mL/min (ref >60)
Glucose, Bld: 121 mg/dL — ABNORMAL HIGH (ref 65–99)
Potassium: 4.7 mmol/L (ref 3.5–5.1)
SODIUM: 140 mmol/L (ref 135–145)

## 2014-11-17 LAB — HEPATIC FUNCTION PANEL
ALBUMIN: 4.1 g/dL (ref 3.5–5.0)
ALT: 15 U/L — ABNORMAL LOW (ref 17–63)
AST: 19 U/L (ref 15–41)
Alkaline Phosphatase: 80 U/L (ref 38–126)
Bilirubin, Direct: <0.1 mg/dL — ABNORMAL LOW (ref 0.1–0.5)
TOTAL PROTEIN: 6.1 g/dL — AB (ref 6.5–8.1)
Total Bilirubin: 0.9 mg/dL (ref 0.3–1.2)

## 2014-11-17 LAB — SAMPLE TO BLOOD BANK

## 2014-11-17 NOTE — Progress Notes (Signed)
[place]  [phone] [phone]     ID: Dean Collins OB: Aug 24, 1940  MR#: [id number]  VPX#:[id number]  Patient Care Team: Idelle Crouch, MD as PCP - General (Internal Medicine) Oneta Rack, MD (Dermatology) Robert Bellow, MD (General Surgery)  CHIEF COMPLAINT/DIAGNOSIS:  Chronic Lymphocytic Leukemia, now stage IV (workup in September 2011 showed platelet count < 100K, WBC 131K, peripheral blood ZAP-70 positive. Bone marrow study shows extensive involvement by CLL. stage 1 - patient referred here for Hematology evaluation and management. 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  Positive for CD5, CD19, CD20, CD23, kappa light chains and was negative for CD10) Got Treanda + Rituxan chemo x 2 cycles in Oct/Nov 2011. Then completed maintenance Rituxan August 2013.  Sept 2015 - Ibrutinib stopped due to poor tolerability. 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 Also started another course of Rituxan for hemolytic anemia and progressive CLL. Has partial response. Started Rituxan+CVP chemo on 02/01/14 (got C6 R-CVP on 06/14/14)    HISTORY OF PRESENT ILLNESS:  Patient returns for continued oncology follow-up, he is on Rituxan therapy for CLL. States that he is doing about the same. He has chronic fatigue on exertion. Otherwise denies any new dyspnea, chest pain, sputum, or hemoptysis. No nausea, vomiting, or diarrhea. No fevers, has minor night sweats. States that tingling and numbness in hands and feet are more bothersome and constant but denies difficulty in use of hands. No new headaches, imbalance, or falls. No other pain issues.   REVIEW OF SYSTEMS:   ROS As in HPI above. In addition, no fever, chills or sweats. No new headaches or focal weakness.  No new mood disturbances. No  sore throat or dysphagia. No dizziness or  palpitation. No abdominal pain, constipation, diarrhea, dysuria or hematuria. No new skin rash or bleeding symptoms. No new paresthesias in extremities. PS ECOG 1.  PAST MEDICAL HISTORY: Reviewed. Past Medical History  Diagnosis Date  . Incontinence   . Arthritis   . Cancer     CLL prostate  . Melanoma     scalp  . Diverticulitis   . CLL (chronic lymphocytic leukemia) 2003  . Hypothyroidism   . Anxiety   . 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 Past Surgical History  Procedure Laterality Date  . Replacement total knee      x2  . Prostatectomy  1993  . Colonoscopy  2013-2014?  Marland Kitchen Total hip arthroplasty Right 08/12/2014    Procedure: TOTAL HIP ARTHROPLASTY ANTERIOR APPROACH;  Surgeon: Hessie Knows, MD;  Location: [facility] ORS;  Service: Orthopedics;  Laterality: Right;    FAMILY HISTORY: Reviewed. Family History  Problem Relation Age of Onset  . Heart attack Mother   . Heart attack Father  SOCIAL HISTORY: Reviewed. Social History  Substance Use Topics  . Smoking status: Former Smoker    Quit date: 03/24/1990  . Smokeless tobacco: Never Used  . Alcohol Use: No     Comment: occasionally    No Known Allergies  Current Outpatient Prescriptions    Medication Sig Dispense Refill  . acetaminophen (TYLENOL) 325 MG tablet Take 2 tablets (650 mg total) by mouth every 6 (six) hours as needed for mild pain (or Fever >/= 101). 20 tablet 0  . alum & mag hydroxide-simeth (MAALOX/MYLANTA) 200-200-20 MG/5ML suspension Take 30 mLs by mouth every 4 (four) hours as needed for indigestion. 355 mL 0  . aspirin EC 325 MG EC tablet Take 1 tablet (325 mg total) by mouth daily with breakfast. 30 tablet 0  . bisacodyl (DULCOLAX) 10 MG suppository Place 1 suppository (10 mg total) rectally daily as needed for moderate constipation. 12 suppository 0  . docusate sodium (COLACE) 100 MG capsule Take 1 capsule (100 mg total) by mouth 2 (two) times daily. 10 capsule 0  . guaiFENesin (MUCINEX) 600 MG 12 hr tablet Take 1 tablet (600 mg total) by mouth 2 (two) times daily. 1 tablet 0  . levothyroxine (SYNTHROID, LEVOTHROID) 50 MCG tablet Take 50 mcg by mouth daily before breakfast.    . levothyroxine (SYNTHROID, LEVOTHROID) 75 MCG tablet Take 75 mcg by mouth daily before breakfast.    . menthol-cetylpyridinium (CEPACOL) 3 MG lozenge Take 1 lozenge (3 mg total) by mouth as needed for sore throat (sore throat). 100 tablet 12  . ondansetron (ZOFRAN) 4 MG tablet Take 1 tablet (4 mg total) by mouth every 6 (six) hours as needed for nausea. 20 tablet 0  . oxyCODONE (OXY IR/ROXICODONE) 5 MG immediate release tablet Take 1-2 tablets (5-10 mg total) by mouth every 4 (four) hours as needed for moderate pain or breakthrough pain. 30 tablet 0  . sertraline (ZOLOFT) 50 MG tablet Take 50 mg by mouth at bedtime.     . simvastatin (ZOCOR) 40 MG tablet Take 40 mg by mouth daily at 6 PM.     . traMADol (ULTRAM) 50 MG tablet Take 2 tablets (100 mg total) by mouth every 6 (six) hours as needed for moderate pain (alternate with oxycodone). 30 tablet 0  . amoxicillin-clavulanate (AUGMENTIN) 875-125 MG per tablet Take 1 tablet by mouth every 12 (twelve) hours. (Patient not taking: Reported on  11/03/2014) 8 tablet 0  . lidocaine-prilocaine (EMLA) cream Apply cream 1 hour before chemotherapy treatment 30 g 1   No current facility-administered medications for this visit.    PHYSICAL EXAM: Filed Vitals:   11/03/14 0939  BP: 155/85  Pulse: 71  Temp: 98.2 F (36.8 C)  Resp: 18     Body mass index is 35.27 kg/(m^2).       GENERAL: Patient is alert and oriented and in no acute distress. There is no icterus. HEENT: EOMs intact. Oral exam negative for thrush or lesions. No cervical lymphadenopathy. CVS: S1S2, regular LUNGS: Bilaterally clear to auscultation, no rhonchi. ABDOMEN: Soft, nontender. No hepatosplenomegaly clinically.  NEURO: grossly nonfocal, cranial nerves are intact.   EXTREMITIES: No pedal edema. LYMPHATICS: Small adenopathy in axillary and inguinal areas.   LAB RESULTS: WBC 66.6, 92% lymphocytes, 5% neutrophils, hemoglobin 12.7, platelets 69, creatinine 1.25    Component Value Date/Time   NA 136 08/15/2014 0619   NA 140 03/08/2014 1052   K 4.7 08/15/2014 0619   K 4.3 03/29/2014 0912   CL 102 08/15/2014  0619   CL 106 03/08/2014 1052   CO2 30 08/15/2014 0619   CO2 29 03/08/2014 1052   GLUCOSE 184* 08/15/2014 0619   GLUCOSE 112* 03/08/2014 1052   BUN 17 08/15/2014 0619   BUN 21* 03/08/2014 1052   CREATININE 1.25* 11/03/2014 0923   CREATININE 1.14 07/05/2014 0926   CALCIUM 8.2* 08/15/2014 0619   CALCIUM 8.6 03/08/2014 1052   PROT 6.2* 11/03/2014 0923   PROT 6.2* 06/29/2014 1047   ALBUMIN 4.2 11/03/2014 0923   ALBUMIN 4.2 06/29/2014 1047   AST 27 11/03/2014 0923   AST 26 06/29/2014 1047   ALT 16* 11/03/2014 0923   ALT 25 06/29/2014 1047   ALKPHOS 93 11/03/2014 0923   ALKPHOS 62 06/29/2014 1047   BILITOT 0.8 11/03/2014 0923   BILITOT 0.9 06/29/2014 1047   GFRNONAA 55* 11/03/2014 0923   GFRNONAA >60 07/05/2014 0926   GFRNONAA >60 04/26/2014 0904   GFRAA >60 11/03/2014 0923   GFRAA >60 07/05/2014 0926   GFRAA >60 04/26/2014 0904      STUDIES: 11/08/13 - CT scan of the abdomen/pelvis. IMPRESSION: Marked splenomegaly 24.3 cm length, with extensive intra-abdominal and pelvicadenopathy, question due to leukemia or lymphoma, less likely metastatic disease. Small amount of nonspecific free pelvic fluid. Diffuse colonic diverticulosis without definite evidence of acute diverticulitis. Cholelithiasis.  04/20/14 - CT scan of the chest/abdomen/pelvis with contrast. IMPRESSION:  1. Spleen is clearly enlarged compared to CT 04/14/2013.  2. Axial lymph nodes measure slightly smaller while retroperitoneal periaortic lymph nodes measure slightly larger. Iliac and periportal lymph nodes are stable.  3. No evidence of new adenopathy.  4. Focus of nondistention in the proximal sigmoid colon persists from comparison exam. Consider colonoscopy for further evaluation if not current.   ASSESSMENT / PLAN:   #1. Chronic Lymphocytic Leukemia, Stage IV, ZAP-70 positive. 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 Patient's currently on single agent Rituxan treatment. CBC shows progression of leukocytosis/lymphocytosis, also thrombocytopenia is worsening. Have explained that this is most likely indicative of progression to CLL and that we will need to change treatment, and have discussed further options including retrying Ibrutinib versus trying Arzerra versus other chemotherapy. Patient wants to try  Ibrutinib, will start at lower dose and escalate as tolerated since he had developed severe hemolytic anemia in the past which had occurred after few days of  Ibrutinib therapy (1 capsule daily 2 weeks, if tolerated well then increased to 2 capsules daily and monitor). We'll monitor labs closely every week daily and see him back in a few weeks for continued follow-up and treatment planning.  #2 h/o right lower Lung mass, PET positive - clinically doing well. Last  CT scan of the chest had shown significant improvement in the right lower lung lesion, and it was felt to be more likely infectious/inflammatory. He is getting intermittent CT scans for CLL monitoring. #3. 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 Hemoglobin just below normal, will continue to monitor. #4. 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 Will continue to monitor. #5. 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 In between visits, patient advised to call or come to ER in case of any new symptoms or acute sickness. He is agreeable to this plan.    Leia Alf, MD   11/17/2014 5:51 AM

## 2014-11-18 LAB — HAPTOGLOBIN: Haptoglobin: 88 mg/dL (ref 34–200)

## 2014-11-22 ENCOUNTER — Telehealth: Payer: Self-pay | Admitting: *Deleted

## 2014-11-22 MED ORDER — IBRUTINIB 140 MG PO CAPS: 280.0000 mg | ORAL_CAPSULE | Freq: Every day | ORAL | Status: DC

## 2014-11-22 NOTE — Telephone Encounter (Signed)
Inquiring if he needs to increase his chemo pill to 2 a day it has been 2 weeks and he had labs drawn on 8/26. He is tolerating med well.

## 2014-11-22 NOTE — Telephone Encounter (Signed)
Per Dr  Ma Hillock, patient to increase Imbruvica to 280 mg (2 capsules) once daily. I called Dean Collins back adn gave her these instructions and she repeated it back to me. She confirmed that he already has a fu appt

## 2014-11-24 ENCOUNTER — Inpatient Hospital Stay: Payer: Medicare Other | Attending: Internal Medicine

## 2014-11-24 DIAGNOSIS — E039 Hypothyroidism, unspecified: Secondary | ICD-10-CM | POA: Insufficient documentation

## 2014-11-24 DIAGNOSIS — D589 Hereditary hemolytic anemia, unspecified: Secondary | ICD-10-CM | POA: Diagnosis not present

## 2014-11-24 DIAGNOSIS — F419 Anxiety disorder, unspecified: Secondary | ICD-10-CM | POA: Diagnosis not present

## 2014-11-24 DIAGNOSIS — D6959 Other secondary thrombocytopenia: Secondary | ICD-10-CM | POA: Diagnosis not present

## 2014-11-24 DIAGNOSIS — D72820 Lymphocytosis (symptomatic): Secondary | ICD-10-CM | POA: Diagnosis not present

## 2014-11-24 DIAGNOSIS — Z79899 Other long term (current) drug therapy: Secondary | ICD-10-CM | POA: Insufficient documentation

## 2014-11-24 DIAGNOSIS — Z8546 Personal history of malignant neoplasm of prostate: Secondary | ICD-10-CM | POA: Diagnosis not present

## 2014-11-24 DIAGNOSIS — Z87891 Personal history of nicotine dependence: Secondary | ICD-10-CM | POA: Insufficient documentation

## 2014-11-24 DIAGNOSIS — Z85820 Personal history of malignant melanoma of skin: Secondary | ICD-10-CM | POA: Insufficient documentation

## 2014-11-24 DIAGNOSIS — G629 Polyneuropathy, unspecified: Secondary | ICD-10-CM | POA: Diagnosis not present

## 2014-11-24 DIAGNOSIS — E785 Hyperlipidemia, unspecified: Secondary | ICD-10-CM | POA: Insufficient documentation

## 2014-11-24 DIAGNOSIS — C9110 Chronic lymphocytic leukemia of B-cell type not having achieved remission: Secondary | ICD-10-CM | POA: Insufficient documentation

## 2014-11-24 LAB — CBC WITH DIFFERENTIAL/PLATELET
Basophils Absolute: 0.3 10*3/uL — ABNORMAL HIGH (ref 0–0.1)
Basophils Relative: 0 %
Eosinophils Absolute: 0.7 10*3/uL (ref 0–0.7)
Eosinophils Relative: 1 %
HEMATOCRIT: 38.2 % — AB (ref 40.0–52.0)
HEMOGLOBIN: 12.3 g/dL — AB (ref 13.0–18.0)
LYMPHS ABS: 143.5 10*3/uL — AB (ref 1.0–3.6)
LYMPHS PCT: 92 %
MCH: 30.3 pg (ref 26.0–34.0)
MCHC: 32.2 g/dL (ref 32.0–36.0)
MCV: 94.1 fL (ref 80.0–100.0)
MONOS PCT: 3 %
Monocytes Absolute: 4.8 10*3/uL — ABNORMAL HIGH (ref 0.2–1.0)
NEUTROS PCT: 4 %
Neutro Abs: 6.4 10*3/uL (ref 1.4–6.5)
Platelets: 62 10*3/uL — ABNORMAL LOW (ref 150–440)
RBC: 4.06 MIL/uL — AB (ref 4.40–5.90)
RDW: 16.9 % — ABNORMAL HIGH (ref 11.5–14.5)
WBC: 155.7 10*3/uL — AB (ref 3.8–10.6)

## 2014-11-24 LAB — SAMPLE TO BLOOD BANK

## 2014-12-01 ENCOUNTER — Encounter: Payer: Self-pay | Admitting: Internal Medicine

## 2014-12-01 ENCOUNTER — Inpatient Hospital Stay (HOSPITAL_BASED_OUTPATIENT_CLINIC_OR_DEPARTMENT_OTHER): Payer: Medicare Other | Admitting: Internal Medicine

## 2014-12-01 ENCOUNTER — Inpatient Hospital Stay: Payer: Medicare Other

## 2014-12-01 ENCOUNTER — Telehealth: Payer: Self-pay | Admitting: *Deleted

## 2014-12-01 VITALS — BP 118/75 | HR 81 | Temp 98.7°F | Resp 18 | Ht 69.0 in | Wt 237.7 lb

## 2014-12-01 DIAGNOSIS — D72820 Lymphocytosis (symptomatic): Secondary | ICD-10-CM

## 2014-12-01 DIAGNOSIS — D589 Hereditary hemolytic anemia, unspecified: Secondary | ICD-10-CM

## 2014-12-01 DIAGNOSIS — C9110 Chronic lymphocytic leukemia of B-cell type not having achieved remission: Secondary | ICD-10-CM

## 2014-12-01 DIAGNOSIS — G629 Polyneuropathy, unspecified: Secondary | ICD-10-CM | POA: Diagnosis not present

## 2014-12-01 DIAGNOSIS — Z8546 Personal history of malignant neoplasm of prostate: Secondary | ICD-10-CM

## 2014-12-01 DIAGNOSIS — Z79899 Other long term (current) drug therapy: Secondary | ICD-10-CM

## 2014-12-01 DIAGNOSIS — Z85820 Personal history of malignant melanoma of skin: Secondary | ICD-10-CM

## 2014-12-01 DIAGNOSIS — D6959 Other secondary thrombocytopenia: Secondary | ICD-10-CM

## 2014-12-01 LAB — CBC WITH DIFFERENTIAL/PLATELET
Basophils Absolute: 0.2 10*3/uL — ABNORMAL HIGH (ref 0–0.1)
Basophils Relative: 0 %
EOS ABS: 0.8 10*3/uL — AB (ref 0–0.7)
HEMATOCRIT: 36.8 % — AB (ref 40.0–52.0)
Hemoglobin: 11.6 g/dL — ABNORMAL LOW (ref 13.0–18.0)
LYMPHS ABS: 163.1 10*3/uL — AB (ref 1.0–3.6)
Lymphocytes Relative: 91 %
MCH: 30 pg (ref 26.0–34.0)
MCHC: 31.6 g/dL — AB (ref 32.0–36.0)
MCV: 95 fL (ref 80.0–100.0)
MONO ABS: 1.9 10*3/uL — AB (ref 0.2–1.0)
Neutro Abs: 11.9 10*3/uL — ABNORMAL HIGH (ref 1.4–6.5)
Neutrophils Relative %: 7 %
Platelets: 65 10*3/uL — ABNORMAL LOW (ref 150–440)
RBC: 3.88 MIL/uL — ABNORMAL LOW (ref 4.40–5.90)
RDW: 16.6 % — AB (ref 11.5–14.5)
WBC: 177.9 10*3/uL — AB (ref 3.8–10.6)

## 2014-12-01 LAB — BASIC METABOLIC PANEL
ANION GAP: 4 — AB (ref 5–15)
BUN: 24 mg/dL — AB (ref 6–20)
CALCIUM: 8.4 mg/dL — AB (ref 8.9–10.3)
CO2: 28 mmol/L (ref 22–32)
Chloride: 105 mmol/L (ref 101–111)
Creatinine, Ser: 1.08 mg/dL (ref 0.61–1.24)
GFR calc Af Amer: >60 mL/min (ref >60)
GLUCOSE: 123 mg/dL — AB (ref 65–99)
Potassium: 4.5 mmol/L (ref 3.5–5.1)
Sodium: 137 mmol/L (ref 135–145)

## 2014-12-01 LAB — SAMPLE TO BLOOD BANK

## 2014-12-01 MED ORDER — AMOXICILLIN-POT CLAVULANATE 875-125 MG PO TABS: 1.0000 | ORAL_TABLET | Freq: Two times a day (BID) | ORAL | Status: DC

## 2014-12-01 NOTE — Progress Notes (Signed)
Pt states that he hurts in left ear. Doing ok with Imbruvica feeling tired from it but still works part time

## 2014-12-01 NOTE — Telephone Encounter (Signed)
escribed atb for pt. pandit gave mer verbal orders to enter

## 2014-12-06 ENCOUNTER — Telehealth: Payer: Self-pay | Admitting: *Deleted

## 2014-12-06 NOTE — Telephone Encounter (Signed)
Called and spoke to wife and they got there supply at [facility] and I called but office closed and will renew it tom.  Wife agreeable to plan

## 2014-12-06 NOTE — Telephone Encounter (Signed)
Only has 5 more pills of Imbruvica left, has lab appt on Friday. Will need it through pt assistance with drug company

## 2014-12-07 ENCOUNTER — Telehealth: Payer: Self-pay | Admitting: *Deleted

## 2014-12-07 DIAGNOSIS — C9110 Chronic lymphocytic leukemia of B-cell type not having achieved remission: Secondary | ICD-10-CM

## 2014-12-07 MED ORDER — IBRUTINIB 140 MG PO CAPS: 280.0000 mg | ORAL_CAPSULE | Freq: Every day | ORAL | Status: AC

## 2014-12-07 NOTE — Telephone Encounter (Signed)
Called [facility] and spoke to Santiago Glad and she states since it has been a long time since pt was on med they would need a written rx sent in to renew dose.  i will print rx and send to [facility].

## 2014-12-08 ENCOUNTER — Inpatient Hospital Stay: Payer: Medicare Other

## 2014-12-08 DIAGNOSIS — C9110 Chronic lymphocytic leukemia of B-cell type not having achieved remission: Secondary | ICD-10-CM | POA: Diagnosis not present

## 2014-12-08 LAB — HEPATIC FUNCTION PANEL
ALK PHOS: 80 U/L (ref 38–126)
ALT: 14 U/L — AB (ref 17–63)
AST: 14 U/L — AB (ref 15–41)
Albumin: 4.1 g/dL (ref 3.5–5.0)
BILIRUBIN DIRECT: 0.1 mg/dL (ref 0.1–0.5)
BILIRUBIN INDIRECT: 0.6 mg/dL (ref 0.3–0.9)
Total Bilirubin: 0.7 mg/dL (ref 0.3–1.2)
Total Protein: 6.2 g/dL — ABNORMAL LOW (ref 6.5–8.1)

## 2014-12-08 LAB — BASIC METABOLIC PANEL
ANION GAP: 5 (ref 5–15)
BUN: 21 mg/dL — AB (ref 6–20)
CHLORIDE: 106 mmol/L (ref 101–111)
CO2: 26 mmol/L (ref 22–32)
Calcium: 8.7 mg/dL — ABNORMAL LOW (ref 8.9–10.3)
Creatinine, Ser: 1.06 mg/dL (ref 0.61–1.24)
GFR calc Af Amer: >60 mL/min (ref >60)
GFR calc non Af Amer: >60 mL/min (ref >60)
GLUCOSE: 118 mg/dL — AB (ref 65–99)
POTASSIUM: 4 mmol/L (ref 3.5–5.1)
Sodium: 137 mmol/L (ref 135–145)

## 2014-12-08 LAB — CBC WITH DIFFERENTIAL/PLATELET
Basophils Absolute: 0.5 10*3/uL — ABNORMAL HIGH (ref 0–0.1)
Basophils Relative: 0 %
Eosinophils Absolute: 1 10*3/uL — ABNORMAL HIGH (ref 0–0.7)
Eosinophils Relative: 1 %
HEMATOCRIT: 36.8 % — AB (ref 40.0–52.0)
HEMOGLOBIN: 11.6 g/dL — AB (ref 13.0–18.0)
LYMPHS ABS: 202.9 10*3/uL — AB (ref 1.0–3.6)
MCH: 30.1 pg (ref 26.0–34.0)
MCHC: 31.6 g/dL — ABNORMAL LOW (ref 32.0–36.0)
MCV: 95.3 fL (ref 80.0–100.0)
Monocytes Absolute: 1.7 10*3/uL — ABNORMAL HIGH (ref 0.2–1.0)
Monocytes Relative: 1 %
NEUTROS ABS: 13.4 10*3/uL — AB (ref 1.4–6.5)
Platelets: 106 10*3/uL — ABNORMAL LOW (ref 150–440)
RBC: 3.86 MIL/uL — AB (ref 4.40–5.90)
RDW: 16 % — ABNORMAL HIGH (ref 11.5–14.5)
WBC: 219.5 10*3/uL — AB (ref 3.8–10.6)

## 2014-12-15 ENCOUNTER — Inpatient Hospital Stay: Payer: Medicare Other

## 2014-12-15 DIAGNOSIS — C9110 Chronic lymphocytic leukemia of B-cell type not having achieved remission: Secondary | ICD-10-CM

## 2014-12-15 LAB — CBC WITH DIFFERENTIAL/PLATELET
Basophils Absolute: 0.5 10*3/uL — ABNORMAL HIGH (ref 0–0.1)
EOS ABS: 0.6 10*3/uL (ref 0–0.7)
HCT: 39.9 % — ABNORMAL LOW (ref 40.0–52.0)
HEMOGLOBIN: 12.1 g/dL — AB (ref 13.0–18.0)
Lymphocytes Relative: 91 %
Lymphs Abs: 113.6 10*3/uL — ABNORMAL HIGH (ref 1.0–3.6)
MCH: 29.3 pg (ref 26.0–34.0)
MCHC: 30.3 g/dL — AB (ref 32.0–36.0)
MCV: 96.7 fL (ref 80.0–100.0)
Monocytes Absolute: 1.5 10*3/uL — ABNORMAL HIGH (ref 0.2–1.0)
Neutro Abs: 10.3 10*3/uL — ABNORMAL HIGH (ref 1.4–6.5)
PLATELETS: 99 10*3/uL — AB (ref 150–440)
RBC: 4.12 MIL/uL — AB (ref 4.40–5.90)
RDW: 16.4 % — ABNORMAL HIGH (ref 11.5–14.5)
WBC: 126.5 10*3/uL — AB (ref 3.8–10.6)

## 2014-12-15 MED ORDER — HEPARIN SOD (PORK) LOCK FLUSH 100 UNIT/ML IV SOLN
500.0000 [IU] | Freq: Once | INTRAVENOUS | Status: AC
  Administered 2014-12-15: 500 [IU] via INTRAVENOUS
  Filled 2014-12-15: qty 5

## 2014-12-15 MED ORDER — SODIUM CHLORIDE 0.9 % IJ SOLN
10.0000 mL | INTRAMUSCULAR | Status: DC | PRN
  Administered 2014-12-15: 10 mL via INTRAVENOUS
  Filled 2014-12-15: qty 10

## 2014-12-16 NOTE — Progress Notes (Signed)
[facility]  Telephone:(336) [phone] Fax:(336) [phone]     ID: ZYMARION FAVORITE OB: [birth year]  MR#: [id number]  XVQ#:[id number]  Patient Care Team: Idelle Crouch, MD as PCP - General (Internal Medicine) Oneta Rack, MD (Dermatology) Robert Bellow, MD (General Surgery)  CHIEF COMPLAINT/DIAGNOSIS:  Chronic Lymphocytic Leukemia, now stage IV (workup in September 2011 showed platelet count < 100K, WBC 131K, peripheral blood ZAP-70 positive. Bone marrow study shows extensive involvement by CLL. stage 1 - patient referred here for Hematology evaluation and management. 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  Positive for CD5, CD19, CD20, CD23, kappa light chains and was negative for CD10) Got Treanda + Rituxan chemo x 2 cycles in Oct/Nov 2011. Then completed maintenance Rituxan August 2013.  Sept 2015 - Ibrutinib stopped due to poor tolerability. 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 Also started another course of Rituxan for hemolytic anemia and progressive CLL. Has partial response. Started Rituxan+CVP chemo on 02/01/14 (got C6 R-CVP on 06/14/14)    HISTORY OF PRESENT ILLNESS:  Patient returns for continued oncology follow-up, he is on Rituxan therapy for CLL. States that chronic fatigue on exertion is same. Denies any new dyspnea, chest pain, sputum, or hemoptysis. No nausea, vomiting, or diarrhea. No fevers, has minor night sweats. States that tingling and numbness in hands and feet is same but denies difficulty in use of hands.    REVIEW OF SYSTEMS:   ROS As in HPI above. In addition, no new headaches or focal weakness.  No new mood disturbances. No  sore throat or dysphagia. No dizziness or palpitation. No abdominal pain, constipation, diarrhea, dysuria or hematuria. No new skin rash or bleeding symptoms. No new paresthesias in extremities.  PS ECOG 1.  PAST MEDICAL HISTORY: Reviewed. Past Medical History  Diagnosis Date  . Incontinence   . Arthritis   . Cancer     CLL prostate  . Melanoma     scalp  . Diverticulitis   . CLL (chronic lymphocytic leukemia) 2003  . Hypothyroidism   . Anxiety   . 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 Past Surgical History  Procedure Laterality Date  . Replacement total knee      x2  . Prostatectomy  1993  . Colonoscopy  2013-2014?  Marland Kitchen Total hip arthroplasty Right 08/12/2014    Procedure: TOTAL HIP ARTHROPLASTY ANTERIOR APPROACH;  Surgeon: Hessie Knows, MD;  Location: [facility] ORS;  Service: Orthopedics;  Laterality: Right;    FAMILY HISTORY: Reviewed. Family History  Problem Relation Age of Onset  . Heart attack Mother   . Heart attack Father     SOCIAL HISTORY: Reviewed. Social History  Substance Use Topics  . Smoking status: Former Smoker    Quit date:  03/24/1990  . Smokeless tobacco: Never Used  . Alcohol Use: No     Comment: occasionally    No Known Allergies  Current Outpatient Prescriptions  Medication Sig Dispense Refill  . bisacodyl (BISACODYL) 5 MG EC tablet Take 5 mg by mouth daily as needed for moderate constipation.    . docusate  sodium (COLACE) 100 MG capsule Take 1 capsule (100 mg total) by mouth 2 (two) times daily. (Patient taking differently: Take 100 mg by mouth daily as needed. ) 10 capsule 0  . guaiFENesin (MUCINEX) 600 MG 12 hr tablet Take 1 tablet (600 mg total) by mouth 2 (two) times daily. (Patient taking differently: Take 600 mg by mouth 2 (two) times daily as needed. ) 1 tablet 0  . levothyroxine (SYNTHROID, LEVOTHROID) 75 MCG tablet Take 75 mcg by mouth daily before breakfast.    . lidocaine-prilocaine (EMLA) cream Apply cream 1 hour before chemotherapy treatment 30 g 1  . menthol-cetylpyridinium (CEPACOL) 3 MG lozenge Take 1 lozenge (3 mg total) by mouth as needed for sore throat (sore throat). 100 tablet 12  . sertraline (ZOLOFT) 50 MG tablet Take 50 mg by mouth at bedtime.     . simvastatin (ZOCOR) 40 MG tablet Take 40 mg by mouth daily at 6 PM.     . traMADol (ULTRAM) 50 MG tablet Take 2 tablets (100 mg total) by mouth every 6 (six) hours as needed for moderate pain (alternate with oxycodone). 30 tablet 0  . amoxicillin-clavulanate (AUGMENTIN) 875-125 MG per tablet Take 1 tablet by mouth 2 (two) times daily. 16 tablet 0  . ibrutinib (IMBRUVICA) 140 MG capsul Take 2 capsules (280 mg total) by mouth daily. 60 capsule 3  . oxyCODONE (OXY IR/ROXICODONE) 5 MG immediate release tablet Take 1-2 tablets (5-10 mg total) by mouth every 4 (four) hours as needed for moderate pain or breakthrough pain. 30 tablet 0   No current facility-administered medications for this visit.    PHYSICAL EXAM: Filed Vitals:   12/01/14 1123  BP: 118/75  Pulse: 81  Temp: 98.7 F (37.1 C)  Resp: 18     Body mass index is 35.08 kg/(m^2).       GENERAL: Alert and oriented and in no acute distress. There is no icterus. HEENT: EOMs intact. Oral exam negative for thrush or lesions. No cervical lymphadenopathy. CVS: S1S2, regular LUNGS: Bilaterally clear to auscultation, no rhonchi. ABDOMEN: Soft, nontender. No hepatosplenomegaly     EXTREMITIES: No pedal edema. LYMPHATICS: Small adenopathy in axillary and inguinal areas, unchanged.   LAB RESULTS: WBC 177.9, Hb 11.6, 91% lymphocytes, 7% neutrophils, platelets 65. Recent creatinine 1.25    Component Value Date/Time   NA 137 12/08/2014 1014   NA 140 03/08/2014 1052   K 4.0 12/08/2014 1014   K 4.3 03/29/2014 0912   CL 106 12/08/2014 1014   CL 106 03/08/2014 1052   CO2 26 12/08/2014 1014   CO2 29 03/08/2014 1052   GLUCOSE 118* 12/08/2014 1014   GLUCOSE 112* 03/08/2014 1052   BUN 21* 12/08/2014 1014   BUN 21* 03/08/2014 1052   CREATININE 1.06 12/08/2014 1014   CREATININE 1.14 07/05/2014 0926   CALCIUM 8.7* 12/08/2014 1014   CALCIUM 8.6 03/08/2014 1052   PROT 6.2* 12/08/2014 1014   PROT 6.2* 06/29/2014 1047   ALBUMIN 4.1 12/08/2014 1014   ALBUMIN 4.2 06/29/2014 1047   AST 14* 12/08/2014 1014   AST 26 06/29/2014 1047   ALT 14* 12/08/2014 1014   ALT 25 06/29/2014  [place] 80 12/08/2014 1014   ALKPHOS 62 06/29/2014 1047   BILITOT 0.7 12/08/2014 1014   BILITOT 0.9 06/29/2014 1047   GFRNONAA >60 12/08/2014 1014   GFRNONAA >60 07/05/2014 0926   GFRNONAA >60 04/26/2014 0904   GFRAA >60 12/08/2014 1014   GFRAA >60 07/05/2014 0926   GFRAA >60 04/26/2014 0904     STUDIES: 11/08/13 - CT scan of the abdomen/pelvis. IMPRESSION: Marked splenomegaly 24.3 cm length, with extensive intra-abdominal and pelvicadenopathy, question due to leukemia or lymphoma, less likely metastatic disease. Small amount of nonspecific free pelvic fluid. Diffuse colonic diverticulosis without definite evidence of acute diverticulitis. Cholelithiasis.  04/20/14 - CT scan of the chest/abdomen/pelvis with contrast. IMPRESSION:  1. Spleen is clearly enlarged compared to CT 04/14/2013.  2. Axial lymph nodes measure slightly smaller while retroperitoneal periaortic lymph nodes measure slightly larger. Iliac and periportal lymph nodes are stable.  3. No evidence of new adenopathy.  4. Focus  of nondistention in the proximal sigmoid colon persists from comparison exam. Consider colonoscopy for further evaluation if not current.   ASSESSMENT / PLAN:   #1. Chronic Lymphocytic Leukemia, Stage IV, ZAP-70 positive. 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 Patient's was on single agent Rituxan treatment but had disease progression and recently switched treatment to Ibrutinib therapy currently taking 2 capsules daily. Today's CBC shows worsening lymphocytosis but this could be initial Ibrutinib effect. He is clinically otherwise doing steady and will continue on current treatment and monitor. Will monitor CBC every week and see him back in a few weeks for continued follow-up and treatment planning.  #2 h/o right lower Lung mass, PET positive - clinically doing well. Last CT scan of the chest had shown significant improvement in the right lower lung lesion, and it was felt to be more likely infectious/inflammatory. He is getting intermittent CT scans for CLL monitoring. #3. 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 Hemoglobin just below normal, will continue to monitor. #4. 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 Will continue to monitor. #5. 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 In between visits, patient advised to call or come to ER in case of any new symptoms or acute sickness. He is agreeable to this plan.    Leia Alf, MD   12/16/2014 3:11 PM

## 2014-12-18 ENCOUNTER — Other Ambulatory Visit: Payer: Self-pay | Admitting: Internal Medicine

## 2014-12-18 ENCOUNTER — Other Ambulatory Visit: Payer: Self-pay | Admitting: *Deleted

## 2014-12-18 DIAGNOSIS — C9110 Chronic lymphocytic leukemia of B-cell type not having achieved remission: Secondary | ICD-10-CM

## 2014-12-18 NOTE — Progress Notes (Signed)
Patient has been taking low-dose Imbruvica,  but now is unable to afford co-pay to continue on this medication. Reportedly it will take weeks for a decision regarding any co-pay assistance to come through.  Given that his CLL has been progressing rapidly, plan is to consider a different treatment regimen with Arzerra (Ofatumumab) starting next week. Will see him with repeat CBC on 12/21/14 and likely start first dose of Arzerra on 12/22/14. Patient and wife have been explained these details, they are agreeable to this plan.

## 2014-12-21 ENCOUNTER — Encounter: Payer: Self-pay | Admitting: *Deleted

## 2014-12-21 ENCOUNTER — Encounter: Payer: Medicare Other | Admitting: Internal Medicine

## 2014-12-21 ENCOUNTER — Inpatient Hospital Stay: Payer: Medicare Other

## 2014-12-21 ENCOUNTER — Other Ambulatory Visit: Payer: Self-pay | Admitting: Internal Medicine

## 2014-12-21 ENCOUNTER — Encounter: Payer: Self-pay | Admitting: Internal Medicine

## 2014-12-21 ENCOUNTER — Telehealth: Payer: Self-pay | Admitting: *Deleted

## 2014-12-21 VITALS — BP 148/72 | HR 89 | Temp 98.9°F | Resp 18 | Ht 69.0 in | Wt 242.5 lb

## 2014-12-21 DIAGNOSIS — C9110 Chronic lymphocytic leukemia of B-cell type not having achieved remission: Secondary | ICD-10-CM | POA: Diagnosis not present

## 2014-12-21 LAB — CBC WITH DIFFERENTIAL/PLATELET
BASOS ABS: 0 10*3/uL (ref 0–0.1)
BASOS PCT: 0 %
Eosinophils Absolute: 1 10*3/uL — ABNORMAL HIGH (ref 0–0.7)
Eosinophils Relative: 1 %
HCT: 37.6 % — ABNORMAL LOW (ref 40.0–52.0)
HEMOGLOBIN: 11.6 g/dL — AB (ref 13.0–18.0)
LYMPHS PCT: 86 %
Lymphs Abs: 89.8 10*3/uL — ABNORMAL HIGH (ref 1.0–3.6)
MCH: 29.3 pg (ref 26.0–34.0)
MCHC: 30.7 g/dL — ABNORMAL LOW (ref 32.0–36.0)
MCV: 95.5 fL (ref 80.0–100.0)
MONOS PCT: 6 %
Monocytes Absolute: 6.3 10*3/uL — ABNORMAL HIGH (ref 0.2–1.0)
NEUTROS ABS: 7.3 10*3/uL — AB (ref 1.4–6.5)
Neutrophils Relative %: 7 %
Platelets: 99 10*3/uL — ABNORMAL LOW (ref 150–440)
RBC: 3.94 MIL/uL — ABNORMAL LOW (ref 4.40–5.90)
RDW: 16 % — ABNORMAL HIGH (ref 11.5–14.5)
WBC: 104.4 10*3/uL (ref 3.8–10.6)

## 2014-12-21 LAB — URIC ACID: URIC ACID, SERUM: 5.7 mg/dL (ref 4.4–7.6)

## 2014-12-21 LAB — MAGNESIUM: Magnesium: 1.9 mg/dL (ref 1.7–2.4)

## 2014-12-21 NOTE — Telephone Encounter (Signed)
Called [employer] and spoke to person about copay foundation approval for ibrutinib.  They told me there is 3 steps to it.  1 is the one that they did over the phone with pt.  2 is the form that the pt has and one part pt fills out and the other part is for the md and then there needs to be tax form from 2015, SS letter about income , copy of insurance card if it includes medication benefits and then fax it to the [employer].  All of the above was done and faxed to [place]

## 2014-12-21 NOTE — Telephone Encounter (Signed)
Called presciption ~ Zolpidem 5mg  1 po every night  #30  into [facility] on [address] for patient. I will call pt to let him know it has been called in.

## 2014-12-21 NOTE — Progress Notes (Signed)
CBC shows WBC count ifs further down to 103K, Hb and platelets remain steady. Given this, plan is to wait and resume on Imbruvica once he gets it in the next few weeks, and hold off on Arzerra unless he is unable to obtain Imbruvica or if there is progression of CLL. Monitor weekly CBC.

## 2014-12-22 ENCOUNTER — Other Ambulatory Visit: Payer: Medicare Other

## 2014-12-22 ENCOUNTER — Ambulatory Visit: Payer: Medicare Other | Admitting: Internal Medicine

## 2014-12-22 ENCOUNTER — Telehealth: Payer: Self-pay | Admitting: *Deleted

## 2014-12-22 ENCOUNTER — Ambulatory Visit: Payer: Medicare Other

## 2014-12-22 ENCOUNTER — Inpatient Hospital Stay: Payer: Medicare Other

## 2014-12-22 NOTE — Telephone Encounter (Signed)
Pt needs refill of ambien to help him sleep. He ran out and sleep not good and having night sweats where he changes the sheets 2 times a week.  Per pandit ok to call in ambien 5 mg at night as needed and then also pt with cLL can have night sweats and hopefully when he gets back on imbruvica and wbc gets back to normal the night sweats get better.  Called in [facility] with 2 refills per pandit verbal permission

## 2014-12-29 ENCOUNTER — Inpatient Hospital Stay (HOSPITAL_BASED_OUTPATIENT_CLINIC_OR_DEPARTMENT_OTHER): Payer: Medicare Other | Admitting: Internal Medicine

## 2014-12-29 ENCOUNTER — Inpatient Hospital Stay: Payer: Medicare Other | Attending: Internal Medicine

## 2014-12-29 VITALS — BP 122/77 | HR 77 | Temp 96.5°F | Resp 18 | Ht 69.0 in | Wt 233.7 lb

## 2014-12-29 DIAGNOSIS — Z85820 Personal history of malignant melanoma of skin: Secondary | ICD-10-CM

## 2014-12-29 DIAGNOSIS — C9110 Chronic lymphocytic leukemia of B-cell type not having achieved remission: Secondary | ICD-10-CM | POA: Insufficient documentation

## 2014-12-29 DIAGNOSIS — D696 Thrombocytopenia, unspecified: Secondary | ICD-10-CM

## 2014-12-29 DIAGNOSIS — Z79899 Other long term (current) drug therapy: Secondary | ICD-10-CM | POA: Insufficient documentation

## 2014-12-29 DIAGNOSIS — G629 Polyneuropathy, unspecified: Secondary | ICD-10-CM | POA: Insufficient documentation

## 2014-12-29 DIAGNOSIS — F419 Anxiety disorder, unspecified: Secondary | ICD-10-CM | POA: Diagnosis not present

## 2014-12-29 DIAGNOSIS — Z8546 Personal history of malignant neoplasm of prostate: Secondary | ICD-10-CM | POA: Insufficient documentation

## 2014-12-29 DIAGNOSIS — E039 Hypothyroidism, unspecified: Secondary | ICD-10-CM | POA: Insufficient documentation

## 2014-12-29 DIAGNOSIS — Z87891 Personal history of nicotine dependence: Secondary | ICD-10-CM | POA: Insufficient documentation

## 2014-12-29 DIAGNOSIS — R918 Other nonspecific abnormal finding of lung field: Secondary | ICD-10-CM | POA: Diagnosis not present

## 2014-12-29 DIAGNOSIS — Z23 Encounter for immunization: Secondary | ICD-10-CM | POA: Insufficient documentation

## 2014-12-29 DIAGNOSIS — D589 Hereditary hemolytic anemia, unspecified: Secondary | ICD-10-CM | POA: Diagnosis not present

## 2014-12-29 DIAGNOSIS — E785 Hyperlipidemia, unspecified: Secondary | ICD-10-CM | POA: Diagnosis not present

## 2014-12-29 LAB — BASIC METABOLIC PANEL
Anion gap: 5 (ref 5–15)
BUN: 30 mg/dL — ABNORMAL HIGH (ref 6–20)
CHLORIDE: 104 mmol/L (ref 101–111)
CO2: 26 mmol/L (ref 22–32)
CREATININE: 1.05 mg/dL (ref 0.61–1.24)
Calcium: 8.6 mg/dL — ABNORMAL LOW (ref 8.9–10.3)
Glucose, Bld: 153 mg/dL — ABNORMAL HIGH (ref 65–99)
Potassium: 5 mmol/L (ref 3.5–5.1)
SODIUM: 135 mmol/L (ref 135–145)

## 2014-12-29 LAB — CBC WITH DIFFERENTIAL/PLATELET
BASOS PCT: 0 %
Basophils Absolute: 0 10*3/uL (ref 0–0.1)
EOS ABS: 0 10*3/uL (ref 0–0.7)
Eosinophils Relative: 0 %
HCT: 42.2 % (ref 40.0–52.0)
HEMOGLOBIN: 13 g/dL (ref 13.0–18.0)
Lymphocytes Relative: 91 %
Lymphs Abs: 124.7 10*3/uL — ABNORMAL HIGH (ref 1.0–3.6)
MCH: 29.3 pg (ref 26.0–34.0)
MCHC: 30.7 g/dL — AB (ref 32.0–36.0)
MCV: 95.4 fL (ref 80.0–100.0)
MONO ABS: 1.4 10*3/uL — AB (ref 0.2–1.0)
Monocytes Relative: 1 %
NEUTROS PCT: 8 %
Neutro Abs: 11 10*3/uL — ABNORMAL HIGH (ref 1.4–6.5)
Platelets: 113 10*3/uL — ABNORMAL LOW (ref 150–440)
RBC: 4.42 MIL/uL (ref 4.40–5.90)
RDW: 15.9 % — ABNORMAL HIGH (ref 11.5–14.5)
WBC: 137.1 10*3/uL — AB (ref 3.8–10.6)

## 2014-12-29 LAB — HEPATIC FUNCTION PANEL
ALBUMIN: 4.2 g/dL (ref 3.5–5.0)
ALK PHOS: 93 U/L (ref 38–126)
ALT: 24 U/L (ref 17–63)
AST: 20 U/L (ref 15–41)
Bilirubin, Direct: 0.2 mg/dL (ref 0.1–0.5)
Indirect Bilirubin: 0.7 mg/dL (ref 0.3–0.9)
TOTAL PROTEIN: 6.7 g/dL (ref 6.5–8.1)
Total Bilirubin: 0.9 mg/dL (ref 0.3–1.2)

## 2014-12-29 MED ORDER — INFLUENZA VAC SPLIT QUAD 0.5 ML IM SUSY
0.5000 mL | PREFILLED_SYRINGE | Freq: Once | INTRAMUSCULAR | Status: AC
  Administered 2014-12-29: 0.5 mL via INTRAMUSCULAR

## 2014-12-29 NOTE — Progress Notes (Signed)
Patient is here for follow-up. He states that overall he has been feeling good. He states that his appetite has been good. He does have occasional night sweats.

## 2014-12-30 NOTE — Progress Notes (Signed)
[place]  Telephone:(336) [phone] Fax:(336) [phone]     ID: Dean Collins OB: 1940/11/11  MR#: [id number]  VHQ#:[id number]  Patient Care Team: Idelle Crouch, MD as PCP - General (Internal Medicine) Oneta Rack, MD (Dermatology) Robert Bellow, MD (General Surgery)  CHIEF COMPLAINT/DIAGNOSIS:  Chronic Lymphocytic Leukemia, now stage IV (workup in September 2011 showed platelet count < 100K, WBC 131K, peripheral blood ZAP-70 positive. Bone marrow study shows extensive involvement by CLL. stage 1 - patient referred here for Hematology evaluation and management. 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  Positive for CD5, CD19, CD20, CD23, kappa light chains and was negative for CD10) Got Treanda + Rituxan chemo x 2 cycles in Oct/Nov 2011. Then completed maintenance Rituxan August 2013.  Sept 2015 - Ibrutinib stopped due to poor tolerability. 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 Also started another course of Rituxan for hemolytic anemia and progressive CLL. Has partial response. Started Rituxan+CVP chemo on 02/01/14 (got C6 R-CVP on 06/14/14). Sept 2016 - started low-dose Ibrutinib (2 capsules daily) due to CLL progression.    HISTORY OF PRESENT ILLNESS:  Patient returns for continued oncology follow-up with repeat CBC. He currently is waiting for the approval of co-pay assistance to resume on Imbruvica since he had recently started back on it at 2 capsules per day and leukocytosis had shown response. Clinically states that he is doing steady. States that chronic fatigue on exertion is same. Denies any new dyspnea, chest pain, sputum, or hemoptysis. No fevers, has minor night sweats.   REVIEW OF SYSTEMS:   ROS As in HPI above. In addition, no new headaches or focal weakness.  No new mood disturbances. No  sore throat or dysphagia. No  dizziness or palpitation. No abdominal pain, constipation, diarrhea, dysuria or hematuria. No new skin rash or bleeding symptoms. No new paresthesias in extremities. PS ECOG 1.  PAST MEDICAL HISTORY: Reviewed. Past Medical History  Diagnosis Date  . Incontinence   . Arthritis   . Cancer     CLL prostate  . Melanoma     scalp  . Diverticulitis   . CLL (chronic lymphocytic leukemia) 2003  . Hypothyroidism   . Anxiety   . 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 Past Surgical History  Procedure Laterality Date  . Replacement total knee      x2  . Prostatectomy  1993  . Colonoscopy  2013-2014?  Marland Kitchen Total hip arthroplasty Right 08/12/2014    Procedure: TOTAL HIP ARTHROPLASTY ANTERIOR APPROACH;  Surgeon: Hessie Knows, MD;  Location: [facility] ORS;  Service: Orthopedics;  Laterality: Right;  . Mohs surgery Right 10/2014    eyebrow    FAMILY HISTORY: Reviewed. Family History  Problem Relation Age of Onset  .  Heart attack Mother   . Heart attack Father     SOCIAL HISTORY: Reviewed. Social History  Substance Use Topics  . Smoking status: Former Smoker    Quit date: 03/24/1990  . Smokeless tobacco: Never Used  . Alcohol Use: No     Comment: occasionally     No Known Allergies  Current Outpatient Prescriptions  Medication Sig Dispense Refill  . ibrutinib (IMBRUVICA) 140 MG capsul Take 2 capsules (280 mg total) by mouth daily. 60 capsule 3  . levothyroxine (SYNTHROID, LEVOTHROID) 75 MCG tablet Take 75 mcg by mouth daily before breakfast.    . lidocaine-prilocaine (EMLA) cream Apply cream 1 hour before chemotherapy treatment 30 g 1  . sertraline (ZOLOFT) 50 MG tablet Take 50 mg by mouth at bedtime.     . simvastatin (ZOCOR) 40 MG tablet Take 40 mg by mouth daily at 6 PM.     . zolpidem (AMBIEN) 5 MG tablet Take 5 mg by mouth at bedtime.     No current facility-administered medications for this visit.    PHYSICAL EXAM: Filed Vitals:   12/29/14 1143  BP: 122/77  Pulse: 77  Temp: 96.5 F (35.8 C)  Resp: 18     Body mass index is 34.49 kg/(m^2).       GENERAL: Alert and oriented and in no acute distress. There is no icterus. HEENT: EOMs intact. Oral exam negative for thrush or lesions. No cervical lymphadenopathy. LUNGS: Bilaterally clear to auscultation, no rhonchi. ABDOMEN: Soft, nontender. No hepatosplenomegaly   LYMPHATICS: Small adenopathy in axillary and inguinal areas, unchanged.   LAB RESULTS: 12/29/14 - WBC 137.1, hemoglobin 13.0, platelets 113. 12/21/14 - hemoglobin 11.6, WBC 104.4, platelets 99.    Component Value Date/Time   NA 135 12/29/2014 1117   NA 140 03/08/2014 1052   K 5.0 12/29/2014 1117   K 4.3 03/29/2014 0912   CL 104 12/29/2014 1117   CL 106 03/08/2014 1052   CO2 26 12/29/2014 1117   CO2 29 03/08/2014 1052   GLUCOSE 153* 12/29/2014 1117   GLUCOSE 112* 03/08/2014 1052   BUN 30* 12/29/2014 1117   BUN 21* 03/08/2014 1052   CREATININE 1.05 12/29/2014 1117   CREATININE 1.14 07/05/2014 0926   CALCIUM 8.6* 12/29/2014 1117   CALCIUM 8.6 03/08/2014 1052   PROT 6.7 12/29/2014 1117   PROT 6.2* 06/29/2014 1047   ALBUMIN 4.2 12/29/2014 1117   ALBUMIN 4.2 06/29/2014 1047   AST 20 12/29/2014 1117   AST 26  06/29/2014 1047   ALT 24 12/29/2014 1117   ALT 25 06/29/2014 1047   ALKPHOS 93 12/29/2014 1117   ALKPHOS 62 06/29/2014 1047   BILITOT 0.9 12/29/2014 1117   BILITOT 0.9 06/29/2014 1047   GFRNONAA >60 12/29/2014 1117   GFRNONAA >60 07/05/2014 0926   GFRNONAA >60 04/26/2014 0904   GFRAA >60 12/29/2014 1117   GFRAA >60 07/05/2014 0926   GFRAA >60 04/26/2014 0904     STUDIES: 11/08/13 - CT scan of the abdomen/pelvis. IMPRESSION: Marked splenomegaly 24.3 cm length, with extensive intra-abdominal and pelvicadenopathy, question due to leukemia or lymphoma, less likely metastatic disease. Small amount of nonspecific free pelvic fluid. Diffuse colonic diverticulosis without definite evidence of acute diverticulitis. Cholelithiasis.  04/20/14 - CT scan of the chest/abdomen/pelvis with contrast. IMPRESSION:  1. Spleen is clearly enlarged compared to CT 04/14/2013.  2. Axial lymph nodes measure slightly smaller while retroperitoneal periaortic lymph nodes measure slightly larger. Iliac and periportal lymph nodes are stable.  3. No evidence  of new adenopathy.  4. Focus of nondistention in the proximal sigmoid colon persists from comparison exam. Consider colonoscopy for further evaluation if not current.   ASSESSMENT / PLAN:   #1. Chronic Lymphocytic Leukemia, Stage IV, ZAP-70 positive. Thrombocytopenia < 100 could be marrow involvement alongwith CLL-related ITP -  Have reviewed labs from today and d/w patient and his wife present. Patient's was on single agent Rituxan treatment but had disease progression and recently switched treatment to Ibrutinib therapy currently taking 2 capsules daily, WBC did increase up to 219.5K initially on this treatment but then responded well by September 29 was down to 104.4. Today also at a slightly higher at 137K. He currently is waiting for the approval of co-pay assistance to resume on Imbruvica. Plan therefore is to await approval and try Imbruvica. If this does not come  through, then plan is to consider other treatment options like Arzerra.  #2 h/o right lower Lung mass, PET positive - clinically doing well. Last CT scan of the chest had shown significant improvement in the right lower lung lesion, and it was felt to be more likely infectious/inflammatory. He is getting intermittent CT scans for CLL monitoring. #3. 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 Hemoglobin is normal today, will continue to monitor. #4. 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 Will continue to monitor. #5. 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 In between visits, patient advised to call or come to ER in case of any new symptoms or acute sickness. He is agreeable to this plan.    Leia Alf, MD   12/30/2014 8:20 AM

## 2015-01-05 ENCOUNTER — Inpatient Hospital Stay: Payer: Medicare Other

## 2015-01-05 DIAGNOSIS — C9110 Chronic lymphocytic leukemia of B-cell type not having achieved remission: Secondary | ICD-10-CM

## 2015-01-05 LAB — CBC WITH DIFFERENTIAL/PLATELET
BASOS ABS: 0 10*3/uL (ref 0–0.1)
Basophils Relative: 0 %
EOS PCT: 1 %
Eosinophils Absolute: 0.6 10*3/uL (ref 0–0.7)
HCT: 39.9 % (ref 39.0–52.0)
Hemoglobin: 12.3 g/dL — ABNORMAL LOW (ref 13.0–17.0)
LYMPHS PCT: 93 %
Lymphs Abs: 125 10*3/uL — ABNORMAL HIGH (ref 1.0–3.6)
MCH: 29.5 pg (ref 26.0–34.0)
MCHC: 30.7 g/dL — AB (ref 32.0–36.0)
MCV: 95.9 fL (ref 80.0–100.0)
MONO ABS: 2.1 10*3/uL — AB (ref 0.2–1.0)
MONOS PCT: 1 %
NEUTROS ABS: 7.2 10*3/uL — AB (ref 1.4–6.5)
Neutrophils Relative %: 5 %
PLATELETS: 81 10*3/uL — AB (ref 150–440)
RBC: 4.16 MIL/uL — ABNORMAL LOW (ref 4.40–5.90)
RDW: 16.3 % — AB (ref 11.5–14.5)
WBC: 134.9 10*3/uL (ref 3.8–10.6)

## 2015-01-09 ENCOUNTER — Telehealth: Payer: Self-pay | Admitting: *Deleted

## 2015-01-09 NOTE — Telephone Encounter (Signed)
Wife sent me a text that pt started on his ibrutinib on 01/04/15. md notified.

## 2015-01-12 ENCOUNTER — Inpatient Hospital Stay (HOSPITAL_BASED_OUTPATIENT_CLINIC_OR_DEPARTMENT_OTHER): Payer: Medicare Other | Admitting: Internal Medicine

## 2015-01-12 ENCOUNTER — Inpatient Hospital Stay: Payer: Medicare Other

## 2015-01-12 VITALS — BP 143/72 | HR 71 | Temp 98.6°F | Wt 238.8 lb

## 2015-01-12 DIAGNOSIS — D696 Thrombocytopenia, unspecified: Secondary | ICD-10-CM

## 2015-01-12 DIAGNOSIS — R918 Other nonspecific abnormal finding of lung field: Secondary | ICD-10-CM

## 2015-01-12 DIAGNOSIS — C9110 Chronic lymphocytic leukemia of B-cell type not having achieved remission: Secondary | ICD-10-CM

## 2015-01-12 DIAGNOSIS — Z85820 Personal history of malignant melanoma of skin: Secondary | ICD-10-CM

## 2015-01-12 DIAGNOSIS — D589 Hereditary hemolytic anemia, unspecified: Secondary | ICD-10-CM

## 2015-01-12 DIAGNOSIS — Z79899 Other long term (current) drug therapy: Secondary | ICD-10-CM

## 2015-01-12 DIAGNOSIS — G629 Polyneuropathy, unspecified: Secondary | ICD-10-CM

## 2015-01-12 DIAGNOSIS — Z8546 Personal history of malignant neoplasm of prostate: Secondary | ICD-10-CM

## 2015-01-12 LAB — CBC WITH DIFFERENTIAL/PLATELET
BASOS ABS: 0.4 10*3/uL — AB (ref 0–0.1)
Basophils Relative: 0 %
Eosinophils Absolute: 0.6 10*3/uL (ref 0–0.7)
HCT: 37 % — ABNORMAL LOW (ref 40.0–52.0)
Hemoglobin: 11.7 g/dL — ABNORMAL LOW (ref 13.0–18.0)
Lymphs Abs: 142.8 10*3/uL — ABNORMAL HIGH (ref 1.0–3.6)
MCH: 30.1 pg (ref 26.0–34.0)
MCHC: 31.7 g/dL — AB (ref 32.0–36.0)
MCV: 94.9 fL (ref 80.0–100.0)
MONO ABS: 1.7 10*3/uL — AB (ref 0.2–1.0)
Neutro Abs: 7.6 10*3/uL — ABNORMAL HIGH (ref 1.4–6.5)
Neutrophils Relative %: 5 %
PLATELETS: 56 10*3/uL — AB (ref 150–440)
RBC: 3.9 MIL/uL — ABNORMAL LOW (ref 4.40–5.90)
RDW: 15.8 % — AB (ref 11.5–14.5)
WBC: 153.1 10*3/uL (ref 3.8–10.6)

## 2015-01-12 NOTE — Progress Notes (Signed)
Patient offers no concerns today other than pain in his right groin due to hip replacement.  Pain level 4/10.

## 2015-01-12 NOTE — Progress Notes (Signed)
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  Patient is currently on 2 pills a day. Denies any nausea vomiting diarrhea. Appetite is good. No weight loss. He has not noted any increase in size of his neck lymph nodes.  Denies any irregular heart beats. Denies any dizziness or falls. Denies any night sweats.  REVIEW OF SYSTEMS:  A complete 10 point review of system is done which is negative except mentioned above/history of present illness.   PAST MEDICAL HISTORY :  Past Medical History  Diagnosis Date  . Incontinence   . Arthritis   . Cancer     CLL prostate  . Melanoma     scalp  . Diverticulitis   . CLL (chronic lymphocytic leukemia) 2003  . Hypothyroidism   . Anxiety   . Medical history non-contributory     PAST SURGICAL HISTORY :   Past Surgical History  Procedure Laterality Date  . Replacement total knee      x2  . Prostatectomy  1993  . Colonoscopy  2013-2014?  Marland Kitchen Total hip arthroplasty Right 08/12/2014    Procedure: TOTAL HIP ARTHROPLASTY ANTERIOR APPROACH;  Surgeon: Hessie Knows, MD;  Location: [facility] ORS;  Service: Orthopedics;  Laterality: Right;  . Mohs surgery Right 10/2014    eyebrow    FAMILY HISTORY :   Family History  Problem Relation Age of Onset  . Heart attack Mother    . Heart attack Father     SOCIAL HISTORY:   Social History  Substance Use Topics  . Smoking status: Former Smoker    Quit date: 03/24/1990  . Smokeless tobacco: Never Used  . Alcohol Use: No     Comment: occasionally    ALLERGIES:  has No Known Allergies.  MEDICATIONS:  Current Outpatient Prescriptions  Medication Sig Dispense Refill  . cetirizine (ZYRTEC) 10 MG tablet Take 10 mg by mouth daily.    Marland Kitchen HYDROcodone-acetaminophen (NORCO/VICODIN) 5-325 MG tablet Take by mouth.    . ibrutinib (IMBRUVICA) 140 MG capsul Take 2 capsules (280 mg total) by mouth daily. 60 capsule 3  . levothyroxine (SYNTHROID, LEVOTHROID) 75 MCG tablet Take 75 mcg by mouth daily before breakfast.    . lidocaine-prilocaine (EMLA) cream Apply cream 1 hour before chemotherapy treatment 30 g 1  . lidocaine-prilocaine (EMLA) cream Apply cream 1 hour before chemotherapy treatment    . sertraline (ZOLOFT) 50 MG tablet Take 50 mg by mouth at bedtime.     . simvastatin (ZOCOR) 40 MG tablet Take 40 mg by mouth daily at 6 PM.     . zolpidem (AMBIEN) 5 MG tablet Take 5 mg by mouth at bedtime.    . bisacodyl (LAXATIVE) 10 MG suppository Place rectally.    . Cholecalciferol (VITAMIN D3) 2000 UNITS capsule Take by mouth.    . fluticasone (FLONASE) 50 MCG/ACT nasal  spray SPRAY TWICE IN EACH NOSTRIL ONCE DAILY  12  . furosemide (LASIX) 20 MG tablet Take by mouth.    . potassium chloride (MICRO-K) 10 MEQ CR capsule Take by mouth.    . traMADol (ULTRAM) 50 MG tablet Take by mouth.    . zolpidem (AMBIEN) 5 MG tablet Take by mouth.     No current facility-administered medications for this visit.    PHYSICAL EXAMINATION: ECOG PERFORMANCE STATUS: 1 - Symptomatic but completely ambulatory  BP 143/72 mmHg  Pulse 71  Temp(Src) 98.6 F (37 C) (Tympanic)  Wt 238 lb 12.1 oz (108.3 kg)  Filed Weights   01/12/15 1044  Weight: 238 lb 12.1 oz (108.3 kg)    GENERAL: Well-nourished well-developed; Alert, no distress and  comfortable.   Accompanied by wife.  EYES: no pallor or icterus OROPHARYNX: no thrush or ulceration; good dentition  NECK: supple, no masses felt LYMPH:  2-3 cm rubbery lymph nodes felt in the right posterior neck. LUNGS: clear to auscultation and  No wheeze or crackles HEART/CVS: regular rate & rhythm and no murmurs; 1+ lower extremity edema. ABDOMEN:abdomen soft, non-tender and normal bowel sounds Musculoskeletal:no cyanosis of digits and no clubbing  PSYCH: alert & oriented x 3 with fluent speech NEURO: no focal motor/sensory deficits SKIN:  no rashes or significant lesions; chronic ecchymosis.  LABORATORY DATA:  I have reviewed the data as listed    Component Value Date/Time   NA 135 12/29/2014 1117   NA 140 03/08/2014 1052   K 5.0 12/29/2014 1117   K 4.3 03/29/2014 0912   CL 104 12/29/2014 1117   CL 106 03/08/2014 1052   CO2 26 12/29/2014 1117   CO2 29 03/08/2014 1052   GLUCOSE 153* 12/29/2014 1117   GLUCOSE 112* 03/08/2014 1052   BUN 30* 12/29/2014 1117   BUN 21* 03/08/2014 1052   CREATININE 1.05 12/29/2014 1117   CREATININE 1.14 07/05/2014 0926   CALCIUM 8.6* 12/29/2014 1117   CALCIUM 8.6 03/08/2014 1052   PROT 6.7 12/29/2014 1117   PROT 6.2* 06/29/2014 1047   ALBUMIN 4.2 12/29/2014 1117   ALBUMIN 4.2 06/29/2014 1047   AST 20 12/29/2014 1117   AST 26 06/29/2014 1047   ALT 24 12/29/2014 1117   ALT 25 06/29/2014 1047   ALKPHOS 93 12/29/2014 1117   ALKPHOS 62 06/29/2014 1047   BILITOT 0.9 12/29/2014 1117   BILITOT 0.9 06/29/2014 1047   GFRNONAA >60 12/29/2014 1117   GFRNONAA >60 07/05/2014 0926   GFRNONAA >60 04/26/2014 0904   GFRAA >60 12/29/2014 1117   GFRAA >60 07/05/2014 0926   GFRAA >60 04/26/2014 0904    No results found for: SPEP, UPEP  Lab Results  Component Value Date   WBC 153.1* 01/12/2015   NEUTROABS 7.6* 01/12/2015   HGB 11.7* 01/12/2015   HCT 37.0* 01/12/2015   MCV 94.9 01/12/2015   PLT 56* 01/12/2015      Chemistry      Component  Value Date/Time   NA 135 12/29/2014 1117   NA 140 03/08/2014 1052   K 5.0 12/29/2014 1117   K 4.3 03/29/2014 0912   CL 104 12/29/2014 1117   CL 106 03/08/2014 1052   CO2 26 12/29/2014 1117   CO2 29 03/08/2014 1052   BUN 30* 12/29/2014 1117   BUN 21* 03/08/2014 1052   CREATININE 1.05 12/29/2014 1117   CREATININE 1.14 07/05/2014 0926      Component Value Date/Time   CALCIUM 8.6* 12/29/2014 1117   CALCIUM  8.6 03/08/2014 1052   ALKPHOS 93 12/29/2014 1117   ALKPHOS 62 06/29/2014 1047   AST 20 12/29/2014 1117   AST 26 06/29/2014 1047   ALT 24 12/29/2014 1117   ALT 25 06/29/2014 1047   BILITOT 0.9 12/29/2014 1117   BILITOT 0.9 06/29/2014 1047       RADIOGRAPHIC STUDIES: I have personally reviewed the radiological images as listed and agreed with the findings in the report. No results found.   ASSESSMENT & PLAN:   # RELAPSED CLL on third line therapy with Ibrutinib. Currently re-started on Ibrutinib 2pills on Oct 13th. Patient tolerating treatment fairly well. Proceed with increasing the dose to 3 pills a day.  # Given the recent relapse- I would also check IVIG H mutation status; and also fish panel to rule out any 17 P deletion's/new mutations.  # Leukocytosis up to 1 53,000; up from 1 30,000 approximately 2 weeks ago- this is a typical hyperleukocytosis type of reaction from ibrutinib. Patient's hemoglobin is stable on 12/platelets around 50s.  Patient will follow-up for weekly labs; and with me in 3 weeks//labs.   No orders of the defined types were placed in this encounter.   All questions were answered. The patient knows to call the clinic with any problems, questions or concerns. No barriers to learning was detected. I spent 25 minutes counseling the patient face to face. The total time spent in the appointment was 30 minutes and more than 50% was on counseling and review of test results     Cammie Sickle, MD 01/12/2015 11:01 AM

## 2015-01-18 ENCOUNTER — Inpatient Hospital Stay: Payer: Medicare Other

## 2015-01-18 DIAGNOSIS — C9110 Chronic lymphocytic leukemia of B-cell type not having achieved remission: Secondary | ICD-10-CM | POA: Diagnosis not present

## 2015-01-18 LAB — CBC WITH DIFFERENTIAL/PLATELET
BASOS ABS: 0.2 10*3/uL — AB (ref 0–0.1)
Eosinophils Absolute: 0.9 10*3/uL — ABNORMAL HIGH (ref 0–0.7)
Eosinophils Relative: 1 %
HEMATOCRIT: 39.2 % — AB (ref 40.0–52.0)
HEMOGLOBIN: 11.6 g/dL — AB (ref 13.0–18.0)
Lymphs Abs: 168.3 10*3/uL — ABNORMAL HIGH (ref 1.0–3.6)
MCH: 28.9 pg (ref 26.0–34.0)
MCHC: 29.6 g/dL — ABNORMAL LOW (ref 32.0–36.0)
MCV: 97.6 fL (ref 80.0–100.0)
MONO ABS: 1.5 10*3/uL — AB (ref 0.2–1.0)
Monocytes Relative: 1 %
NEUTROS ABS: 7 10*3/uL — AB (ref 1.4–6.5)
Platelets: 87 10*3/uL — ABNORMAL LOW (ref 150–440)
RBC: 4.02 MIL/uL — AB (ref 4.40–5.90)
RDW: 16 % — AB (ref 11.5–14.5)
WBC: 178 10*3/uL — AB (ref 3.8–10.6)

## 2015-01-25 ENCOUNTER — Other Ambulatory Visit: Payer: Medicare Other

## 2015-01-26 ENCOUNTER — Inpatient Hospital Stay: Payer: Medicare Other | Attending: Family Medicine

## 2015-01-26 ENCOUNTER — Inpatient Hospital Stay: Payer: Medicare Other

## 2015-01-26 DIAGNOSIS — D696 Thrombocytopenia, unspecified: Secondary | ICD-10-CM | POA: Insufficient documentation

## 2015-01-26 DIAGNOSIS — D589 Hereditary hemolytic anemia, unspecified: Secondary | ICD-10-CM | POA: Insufficient documentation

## 2015-01-26 DIAGNOSIS — C9110 Chronic lymphocytic leukemia of B-cell type not having achieved remission: Secondary | ICD-10-CM | POA: Insufficient documentation

## 2015-01-26 LAB — CBC WITH DIFFERENTIAL/PLATELET
BASOS ABS: 0 10*3/uL (ref 0–0.1)
BASOS PCT: 0 %
Band Neutrophils: 0 %
Blasts: 0 %
EOS PCT: 0 %
Eosinophils Absolute: 0 10*3/uL (ref 0–0.7)
HCT: 35.7 % — ABNORMAL LOW (ref 40.0–52.0)
HEMOGLOBIN: 11.3 g/dL — AB (ref 13.0–18.0)
LYMPHS ABS: 201.9 10*3/uL — AB (ref 1.0–3.6)
Lymphocytes Relative: 95 %
MCH: 30.2 pg (ref 26.0–34.0)
MCHC: 31.7 g/dL — ABNORMAL LOW (ref 32.0–36.0)
MCV: 95.1 fL (ref 80.0–100.0)
METAMYELOCYTES PCT: 0 %
MONO ABS: 6.4 10*3/uL — AB (ref 0.2–1.0)
MYELOCYTES: 0 %
Monocytes Relative: 3 %
NEUTROS PCT: 2 %
Neutro Abs: 4.3 10*3/uL (ref 1.4–6.5)
Other: 0 %
PLATELETS: 108 10*3/uL — AB (ref 150–440)
PROMYELOCYTES ABS: 0 %
RBC: 3.76 MIL/uL — AB (ref 4.40–5.90)
RDW: 15.9 % — ABNORMAL HIGH (ref 11.5–14.5)
WBC: 212.6 10*3/uL — AB (ref 3.8–10.6)
nRBC: 0 /100 WBC

## 2015-01-26 MED ORDER — HEPARIN SOD (PORK) LOCK FLUSH 100 UNIT/ML IV SOLN
500.0000 [IU] | Freq: Once | INTRAVENOUS | Status: AC
  Administered 2015-01-26: 500 [IU] via INTRAVENOUS
  Filled 2015-01-26: qty 5

## 2015-01-26 MED ORDER — SODIUM CHLORIDE 0.9 % IJ SOLN
10.0000 mL | INTRAMUSCULAR | Status: DC | PRN
  Administered 2015-01-26: 10 mL via INTRAVENOUS
  Filled 2015-01-26: qty 10

## 2015-01-30 NOTE — Progress Notes (Signed)
This encounter was created in error - please disregard.

## 2015-01-31 ENCOUNTER — Inpatient Hospital Stay: Payer: Medicare Other

## 2015-01-31 ENCOUNTER — Inpatient Hospital Stay
Admission: EM | Admit: 2015-01-31 | Discharge: 2015-02-22 | DRG: 208 | Disposition: E | Payer: Medicare Other | Attending: Internal Medicine | Admitting: Internal Medicine

## 2015-01-31 ENCOUNTER — Emergency Department: Payer: Medicare Other

## 2015-01-31 ENCOUNTER — Other Ambulatory Visit: Payer: Self-pay

## 2015-01-31 ENCOUNTER — Encounter: Payer: Self-pay | Admitting: Emergency Medicine

## 2015-01-31 DIAGNOSIS — D72829 Elevated white blood cell count, unspecified: Secondary | ICD-10-CM | POA: Diagnosis not present

## 2015-01-31 DIAGNOSIS — R0600 Dyspnea, unspecified: Secondary | ICD-10-CM

## 2015-01-31 DIAGNOSIS — C9112 Chronic lymphocytic leukemia of B-cell type in relapse: Secondary | ICD-10-CM | POA: Diagnosis present

## 2015-01-31 DIAGNOSIS — R6521 Severe sepsis with septic shock: Secondary | ICD-10-CM | POA: Diagnosis present

## 2015-01-31 DIAGNOSIS — E039 Hypothyroidism, unspecified: Secondary | ICD-10-CM | POA: Diagnosis present

## 2015-01-31 DIAGNOSIS — D638 Anemia in other chronic diseases classified elsewhere: Secondary | ICD-10-CM | POA: Diagnosis present

## 2015-01-31 DIAGNOSIS — Z96641 Presence of right artificial hip joint: Secondary | ICD-10-CM | POA: Diagnosis present

## 2015-01-31 DIAGNOSIS — J440 Chronic obstructive pulmonary disease with acute lower respiratory infection: Secondary | ICD-10-CM | POA: Diagnosis present

## 2015-01-31 DIAGNOSIS — R778 Other specified abnormalities of plasma proteins: Secondary | ICD-10-CM | POA: Diagnosis present

## 2015-01-31 DIAGNOSIS — Z8249 Family history of ischemic heart disease and other diseases of the circulatory system: Secondary | ICD-10-CM | POA: Diagnosis not present

## 2015-01-31 DIAGNOSIS — E876 Hypokalemia: Secondary | ICD-10-CM | POA: Diagnosis present

## 2015-01-31 DIAGNOSIS — I2699 Other pulmonary embolism without acute cor pulmonale: Secondary | ICD-10-CM | POA: Diagnosis present

## 2015-01-31 DIAGNOSIS — Z01818 Encounter for other preprocedural examination: Secondary | ICD-10-CM

## 2015-01-31 DIAGNOSIS — K8020 Calculus of gallbladder without cholecystitis without obstruction: Secondary | ICD-10-CM | POA: Diagnosis present

## 2015-01-31 DIAGNOSIS — E669 Obesity, unspecified: Secondary | ICD-10-CM | POA: Diagnosis present

## 2015-01-31 DIAGNOSIS — T17990A Other foreign object in respiratory tract, part unspecified in causing asphyxiation, initial encounter: Secondary | ICD-10-CM | POA: Diagnosis present

## 2015-01-31 DIAGNOSIS — E875 Hyperkalemia: Secondary | ICD-10-CM | POA: Diagnosis present

## 2015-01-31 DIAGNOSIS — J154 Pneumonia due to other streptococci: Secondary | ICD-10-CM | POA: Diagnosis present

## 2015-01-31 DIAGNOSIS — Z66 Do not resuscitate: Secondary | ICD-10-CM | POA: Diagnosis not present

## 2015-01-31 DIAGNOSIS — Z85038 Personal history of other malignant neoplasm of large intestine: Secondary | ICD-10-CM

## 2015-01-31 DIAGNOSIS — I4891 Unspecified atrial fibrillation: Secondary | ICD-10-CM

## 2015-01-31 DIAGNOSIS — D696 Thrombocytopenia, unspecified: Secondary | ICD-10-CM | POA: Diagnosis present

## 2015-01-31 DIAGNOSIS — R7989 Other specified abnormal findings of blood chemistry: Secondary | ICD-10-CM

## 2015-01-31 DIAGNOSIS — I480 Paroxysmal atrial fibrillation: Secondary | ICD-10-CM | POA: Diagnosis not present

## 2015-01-31 DIAGNOSIS — C9110 Chronic lymphocytic leukemia of B-cell type not having achieved remission: Secondary | ICD-10-CM | POA: Diagnosis not present

## 2015-01-31 DIAGNOSIS — J9601 Acute respiratory failure with hypoxia: Secondary | ICD-10-CM | POA: Diagnosis present

## 2015-01-31 DIAGNOSIS — Z87891 Personal history of nicotine dependence: Secondary | ICD-10-CM

## 2015-01-31 DIAGNOSIS — Z8546 Personal history of malignant neoplasm of prostate: Secondary | ICD-10-CM | POA: Diagnosis not present

## 2015-01-31 DIAGNOSIS — Z85820 Personal history of malignant melanoma of skin: Secondary | ICD-10-CM

## 2015-01-31 DIAGNOSIS — Z6835 Body mass index (BMI) 35.0-35.9, adult: Secondary | ICD-10-CM | POA: Diagnosis not present

## 2015-01-31 DIAGNOSIS — F329 Major depressive disorder, single episode, unspecified: Secondary | ICD-10-CM | POA: Diagnosis present

## 2015-01-31 DIAGNOSIS — R162 Hepatomegaly with splenomegaly, not elsewhere classified: Secondary | ICD-10-CM | POA: Diagnosis present

## 2015-01-31 DIAGNOSIS — Z9911 Dependence on respirator [ventilator] status: Secondary | ICD-10-CM | POA: Diagnosis not present

## 2015-01-31 DIAGNOSIS — M1990 Unspecified osteoarthritis, unspecified site: Secondary | ICD-10-CM | POA: Diagnosis present

## 2015-01-31 DIAGNOSIS — E785 Hyperlipidemia, unspecified: Secondary | ICD-10-CM | POA: Diagnosis present

## 2015-01-31 DIAGNOSIS — J181 Lobar pneumonia, unspecified organism: Secondary | ICD-10-CM

## 2015-01-31 DIAGNOSIS — N179 Acute kidney failure, unspecified: Secondary | ICD-10-CM | POA: Diagnosis present

## 2015-01-31 DIAGNOSIS — J189 Pneumonia, unspecified organism: Secondary | ICD-10-CM | POA: Diagnosis not present

## 2015-01-31 DIAGNOSIS — I959 Hypotension, unspecified: Secondary | ICD-10-CM | POA: Diagnosis present

## 2015-01-31 DIAGNOSIS — I481 Persistent atrial fibrillation: Secondary | ICD-10-CM | POA: Diagnosis not present

## 2015-01-31 DIAGNOSIS — F419 Anxiety disorder, unspecified: Secondary | ICD-10-CM | POA: Diagnosis present

## 2015-01-31 DIAGNOSIS — J9690 Respiratory failure, unspecified, unspecified whether with hypoxia or hypercapnia: Secondary | ICD-10-CM

## 2015-01-31 DIAGNOSIS — Z4659 Encounter for fitting and adjustment of other gastrointestinal appliance and device: Secondary | ICD-10-CM

## 2015-01-31 DIAGNOSIS — A419 Sepsis, unspecified organism: Principal | ICD-10-CM | POA: Diagnosis present

## 2015-01-31 DIAGNOSIS — Z79899 Other long term (current) drug therapy: Secondary | ICD-10-CM

## 2015-01-31 DIAGNOSIS — E871 Hypo-osmolality and hyponatremia: Secondary | ICD-10-CM | POA: Diagnosis present

## 2015-01-31 HISTORY — DX: Personal history of malignant neoplasm of prostate: Z85.46

## 2015-01-31 HISTORY — DX: Lobar pneumonia, unspecified organism: J18.1

## 2015-01-31 LAB — COMPREHENSIVE METABOLIC PANEL
ALBUMIN: 3.9 g/dL (ref 3.5–5.0)
ALK PHOS: 82 U/L (ref 38–126)
ALT: 13 U/L — AB (ref 17–63)
ANION GAP: 8 (ref 5–15)
AST: 21 U/L (ref 15–41)
BUN: 22 mg/dL — ABNORMAL HIGH (ref 6–20)
CHLORIDE: 100 mmol/L — AB (ref 101–111)
CO2: 23 mmol/L (ref 22–32)
Calcium: 8.7 mg/dL — ABNORMAL LOW (ref 8.9–10.3)
Creatinine, Ser: 1.14 mg/dL (ref 0.61–1.24)
GFR calc non Af Amer: >60 mL/min (ref >60)
GLUCOSE: 155 mg/dL — AB (ref 65–99)
Potassium: 4 mmol/L (ref 3.5–5.1)
SODIUM: 131 mmol/L — AB (ref 135–145)
Total Bilirubin: 2.1 mg/dL — ABNORMAL HIGH (ref 0.3–1.2)
Total Protein: 6.6 g/dL (ref 6.5–8.1)

## 2015-01-31 LAB — EXPECTORATED SPUTUM ASSESSMENT W REFEX TO RESP CULTURE

## 2015-01-31 LAB — BLOOD GAS, ARTERIAL
ACID-BASE DEFICIT: 2.9 mmol/L — AB (ref 0.0–2.0)
ACID-BASE EXCESS: 0.2 mmol/L (ref 0.0–3.0)
ALLENS TEST (PASS/FAIL): POSITIVE — AB
BICARBONATE: 23.3 meq/L (ref 21.0–28.0)
BICARBONATE: 22.6 meq/L (ref 20.0–24.0)
FIO2: 0.36
FIO2: 0.8
O2 SAT: 90.3 %
O2 Saturation: 96.8 %
PCO2 ART: 32 mmHg (ref 32.0–48.0)
PEEP: 5 cmH2O
PH ART: 7.35 (ref 7.350–7.450)
PO2 ART: 55 mmHg — AB (ref 83.0–108.0)
Patient temperature: 37
RATE: 15 resp/min
VT: 500 mL
pCO2 arterial: 41 mmHg (ref 35.0–45.0)
pH, Arterial: 7.47 — ABNORMAL HIGH (ref 7.350–7.450)
pO2, Arterial: 93 mmHg (ref 80.0–100.0)

## 2015-01-31 LAB — URINALYSIS COMPLETE WITH MICROSCOPIC (ARMC ONLY)
BILIRUBIN URINE: NEGATIVE
Bacteria, UA: NONE SEEN
Glucose, UA: NEGATIVE mg/dL
LEUKOCYTES UA: NEGATIVE
NITRITE: NEGATIVE
PROTEIN: 100 mg/dL — AB
SPECIFIC GRAVITY, URINE: 1.025 (ref 1.005–1.030)
pH: 5 (ref 5.0–8.0)

## 2015-01-31 LAB — GLUCOSE, CAPILLARY: GLUCOSE-CAPILLARY: 159 mg/dL — AB (ref 65–99)

## 2015-01-31 LAB — CBC WITH DIFFERENTIAL/PLATELET
Basophils Absolute: 0.1 10*3/uL (ref 0–0.1)
Basophils Relative: 0 %
Eosinophils Absolute: 0.6 10*3/uL (ref 0–0.7)
Eosinophils Relative: 0 %
HEMATOCRIT: 35.1 % — AB (ref 40.0–52.0)
HEMOGLOBIN: 11 g/dL — AB (ref 13.0–18.0)
LYMPHS ABS: 256.2 10*3/uL — AB (ref 1.0–3.6)
MCH: 29.3 pg (ref 26.0–34.0)
MCHC: 31.3 g/dL — AB (ref 32.0–36.0)
MCV: 93.6 fL (ref 80.0–100.0)
MONO ABS: 4.5 10*3/uL — AB (ref 0.2–1.0)
NEUTROS ABS: 41.9 10*3/uL — AB (ref 1.4–6.5)
Platelets: 107 10*3/uL — ABNORMAL LOW (ref 150–440)
RBC: 3.76 MIL/uL — ABNORMAL LOW (ref 4.40–5.90)
RDW: 16.1 % — AB (ref 11.5–14.5)
WBC: 303.3 10*3/uL (ref 3.8–10.6)

## 2015-01-31 LAB — TROPONIN I: TROPONIN I: 0.07 ng/mL — AB (ref <0.031)

## 2015-01-31 LAB — LACTIC ACID, PLASMA
Lactic Acid, Venous: 1.6 mmol/L (ref 0.5–2.0)
Lactic Acid, Venous: 1.5 mmol/L (ref 0.5–2.0)

## 2015-01-31 LAB — EXPECTORATED SPUTUM ASSESSMENT W GRAM STAIN, RFLX TO RESP C

## 2015-01-31 LAB — TSH: TSH: 8.274 u[IU]/mL — ABNORMAL HIGH (ref 0.350–4.500)

## 2015-01-31 LAB — BRAIN NATRIURETIC PEPTIDE: B Natriuretic Peptide: 101 pg/mL — ABNORMAL HIGH (ref 0.0–100.0)

## 2015-01-31 MED ORDER — FENTANYL CITRATE (PF) 100 MCG/2ML IJ SOLN: 50.0000 ug | INTRAMUSCULAR | Status: DC | PRN

## 2015-01-31 MED ORDER — ACETAMINOPHEN 650 MG RE SUPP: 650.0000 mg | Freq: Four times a day (QID) | RECTAL | Status: DC | PRN

## 2015-01-31 MED ORDER — LORAZEPAM 2 MG/ML IJ SOLN
1.0000 mg | Freq: Once | INTRAMUSCULAR | Status: AC
  Administered 2015-01-31: 1 mg via INTRAVENOUS

## 2015-01-31 MED ORDER — VANCOMYCIN HCL IN DEXTROSE 1-5 GM/200ML-% IV SOLN
1000.0000 mg | Freq: Once | INTRAVENOUS | Status: AC
  Administered 2015-01-31: 1000 mg via INTRAVENOUS
  Filled 2015-01-31: qty 200

## 2015-01-31 MED ORDER — METHOCARBAMOL 500 MG PO TABS
500.0000 mg | ORAL_TABLET | Freq: Two times a day (BID) | ORAL | Status: DC
  Filled 2015-01-31: qty 1

## 2015-01-31 MED ORDER — LEVOTHYROXINE SODIUM 75 MCG PO TABS: 125.0000 ug | ORAL_TABLET | Freq: Every day | ORAL | Status: DC

## 2015-01-31 MED ORDER — ENOXAPARIN SODIUM 40 MG/0.4ML ~~LOC~~ SOLN
40.0000 mg | SUBCUTANEOUS | Status: DC
  Filled 2015-01-31: qty 0.4

## 2015-01-31 MED ORDER — ZOLPIDEM TARTRATE 5 MG PO TABS: 5.0000 mg | ORAL_TABLET | Freq: Every day | ORAL | Status: DC

## 2015-01-31 MED ORDER — AMIODARONE HCL IN DEXTROSE 360-4.14 MG/200ML-% IV SOLN
30.0000 mg/h | INTRAVENOUS | Status: DC
  Administered 2015-02-01 – 2015-02-03 (×4): 30 mg/h via INTRAVENOUS
  Filled 2015-01-31 (×9): qty 200

## 2015-01-31 MED ORDER — HYDROCODONE-ACETAMINOPHEN 5-325 MG PO TABS: 1.0000 | ORAL_TABLET | Freq: Four times a day (QID) | ORAL | Status: DC | PRN

## 2015-01-31 MED ORDER — SODIUM CHLORIDE 0.9 % IJ SOLN
3.0000 mL | Freq: Two times a day (BID) | INTRAMUSCULAR | Status: DC
  Administered 2015-02-01 – 2015-02-11 (×20): 3 mL via INTRAVENOUS

## 2015-01-31 MED ORDER — ONDANSETRON HCL 4 MG/2ML IJ SOLN: 4.0000 mg | Freq: Four times a day (QID) | INTRAMUSCULAR | Status: DC | PRN

## 2015-01-31 MED ORDER — VANCOMYCIN HCL IN DEXTROSE 1-5 GM/200ML-% IV SOLN
1000.0000 mg | Freq: Two times a day (BID) | INTRAVENOUS | Status: DC
  Administered 2015-02-01 – 2015-02-02 (×3): 1000 mg via INTRAVENOUS
  Filled 2015-01-31 (×4): qty 200

## 2015-01-31 MED ORDER — ACETAMINOPHEN 325 MG PO TABS: 650.0000 mg | ORAL_TABLET | Freq: Four times a day (QID) | ORAL | Status: DC | PRN

## 2015-01-31 MED ORDER — PIPERACILLIN-TAZOBACTAM 4.5 G IVPB
4.5000 g | Freq: Three times a day (TID) | INTRAVENOUS | Status: DC
  Administered 2015-02-01 – 2015-02-02 (×6): 4.5 g via INTRAVENOUS
  Filled 2015-01-31 (×7): qty 100

## 2015-01-31 MED ORDER — ENOXAPARIN SODIUM 40 MG/0.4ML ~~LOC~~ SOLN
40.0000 mg | SUBCUTANEOUS | Status: DC
  Administered 2015-02-01 – 2015-02-10 (×11): 40 mg via SUBCUTANEOUS
  Filled 2015-01-31 (×10): qty 0.4

## 2015-01-31 MED ORDER — FUROSEMIDE 20 MG PO TABS
20.0000 mg | ORAL_TABLET | Freq: Every day | ORAL | Status: DC
  Filled 2015-01-31: qty 1

## 2015-01-31 MED ORDER — DOCUSATE SODIUM 50 MG/5ML PO LIQD: 100.0000 mg | Freq: Two times a day (BID) | ORAL | Status: DC | PRN

## 2015-01-31 MED ORDER — ETOMIDATE 2 MG/ML IV SOLN
20.0000 mg | Freq: Once | INTRAVENOUS | Status: AC
  Administered 2015-01-31: 20 mg via INTRAVENOUS

## 2015-01-31 MED ORDER — PANTOPRAZOLE SODIUM 40 MG PO PACK
40.0000 mg | PACK | Freq: Every day | ORAL | Status: DC
  Administered 2015-02-01 – 2015-02-02 (×2): 40 mg
  Filled 2015-01-31 (×3): qty 20

## 2015-01-31 MED ORDER — AMIODARONE HCL IN DEXTROSE 360-4.14 MG/200ML-% IV SOLN
60.0000 mg/h | INTRAVENOUS | Status: AC
  Administered 2015-02-01 (×2): 60 mg/h via INTRAVENOUS
  Filled 2015-01-31 (×2): qty 200

## 2015-01-31 MED ORDER — SIMVASTATIN 40 MG PO TABS: 40.0000 mg | ORAL_TABLET | Freq: Every day | ORAL | Status: DC

## 2015-01-31 MED ORDER — DEXTROSE 5 % IV SOLN
1.0000 g | Freq: Once | INTRAVENOUS | Status: AC
  Administered 2015-01-31: 1 g via INTRAVENOUS
  Filled 2015-01-31: qty 10

## 2015-01-31 MED ORDER — SERTRALINE HCL 50 MG PO TABS
50.0000 mg | ORAL_TABLET | Freq: Every day | ORAL | Status: DC
  Administered 2015-02-02: 50 mg
  Filled 2015-01-31: qty 1

## 2015-01-31 MED ORDER — LORAZEPAM 2 MG/ML IJ SOLN
INTRAMUSCULAR | Status: AC
  Administered 2015-01-31: 1 mg via INTRAVENOUS
  Filled 2015-01-31: qty 1

## 2015-01-31 MED ORDER — FLUTICASONE PROPIONATE 50 MCG/ACT NA SUSP
2.0000 | Freq: Every day | NASAL | Status: DC
  Filled 2015-01-31: qty 16

## 2015-01-31 MED ORDER — POTASSIUM CHLORIDE CRYS ER 20 MEQ PO TBCR: 20.0000 meq | EXTENDED_RELEASE_TABLET | Freq: Every day | ORAL | Status: DC

## 2015-01-31 MED ORDER — PROPOFOL 1000 MG/100ML IV EMUL
INTRAVENOUS | Status: AC
  Administered 2015-01-31: 20 mg
  Filled 2015-01-31: qty 100

## 2015-01-31 MED ORDER — SERTRALINE HCL 50 MG PO TABS
50.0000 mg | ORAL_TABLET | Freq: Every day | ORAL | Status: DC
  Filled 2015-01-31: qty 1

## 2015-01-31 MED ORDER — MIDAZOLAM HCL 2 MG/2ML IJ SOLN: 1.0000 mg | INTRAMUSCULAR | Status: DC | PRN

## 2015-01-31 MED ORDER — AMIODARONE LOAD VIA INFUSION
150.0000 mg | Freq: Once | INTRAVENOUS | Status: AC
  Administered 2015-02-01: 150 mg via INTRAVENOUS
  Filled 2015-01-31: qty 83.34

## 2015-01-31 MED ORDER — LORAZEPAM 2 MG/ML IJ SOLN
1.0000 mg | Freq: Once | INTRAMUSCULAR | Status: AC
  Administered 2015-01-31: 1 mg via INTRAVENOUS
  Filled 2015-01-31: qty 1

## 2015-01-31 MED ORDER — IPRATROPIUM-ALBUTEROL 0.5-2.5 (3) MG/3ML IN SOLN
RESPIRATORY_TRACT | Status: AC
  Administered 2015-01-31: 6 mL
  Filled 2015-01-31: qty 6

## 2015-01-31 MED ORDER — SODIUM CHLORIDE 0.9 % IV BOLUS (SEPSIS)
1000.0000 mL | INTRAVENOUS | Status: DC
  Administered 2015-01-31: 1000 mL via INTRAVENOUS
  Administered 2015-01-31: 500 mL via INTRAVENOUS

## 2015-01-31 MED ORDER — FENTANYL CITRATE (PF) 100 MCG/2ML IJ SOLN
50.0000 ug | INTRAMUSCULAR | Status: DC | PRN
Start: 2015-01-31 — End: 2015-02-03

## 2015-01-31 MED ORDER — LEVOTHYROXINE SODIUM 75 MCG PO TABS: 75.0000 ug | ORAL_TABLET | Freq: Every day | ORAL | Status: DC

## 2015-01-31 MED ORDER — SODIUM CHLORIDE 0.9 % IV BOLUS (SEPSIS): 500.0000 mL | INTRAVENOUS | Status: DC

## 2015-01-31 MED ORDER — SUCCINYLCHOLINE CHLORIDE 20 MG/ML IJ SOLN
100.0000 mg | Freq: Once | INTRAMUSCULAR | Status: AC
  Administered 2015-01-31: 20 mg via INTRAVENOUS

## 2015-01-31 MED ORDER — SODIUM CHLORIDE 0.9 % IV BOLUS (SEPSIS)
500.0000 mL | Freq: Once | INTRAVENOUS | Status: AC
  Administered 2015-02-01: 500 mL via INTRAVENOUS

## 2015-01-31 MED ORDER — LORATADINE 10 MG PO TABS
10.0000 mg | ORAL_TABLET | Freq: Every day | ORAL | Status: DC
  Filled 2015-01-31: qty 1

## 2015-01-31 MED ORDER — ONDANSETRON HCL 4 MG PO TABS: 4.0000 mg | ORAL_TABLET | Freq: Four times a day (QID) | ORAL | Status: DC | PRN

## 2015-01-31 MED ORDER — METOPROLOL TARTRATE 1 MG/ML IV SOLN: 2.5000 mg | INTRAVENOUS | Status: DC | PRN

## 2015-01-31 NOTE — ED Notes (Signed)
Brought over from [facility] with SOB

## 2015-01-31 NOTE — Progress Notes (Signed)
[facility] Progress Note Patient Name: Dean Collins DOB: 03/20/1941 MRN: [id number]   Date of Service  02/01/2015  HPI/Events of Note  Severe distress Required  ETT  eICU Interventions  MD notified and Rt tio perfrom     Intervention Category Major Interventions: Hypoxemia - evaluation and management  Raylene Miyamoto. 01/26/2015, 9:54 PM

## 2015-01-31 NOTE — H&P (Signed)
[facility] NAME: Dean Collins    MR#:  [id number]  DATE OF BIRTH:  June 02, 1940  DATE OF ADMISSION:  02/06/2015  PRIMARY CARE PHYSICIAN: Idelle Crouch, MD   REQUESTING/REFERRING PHYSICIAN: Dr. Conni Slipper  CHIEF COMPLAINT:   Chief Complaint  Patient presents with  . Shortness of Breath    HISTORY OF PRESENT ILLNESS:  Dean Collins  is a 74 y.o. male with a known history of CLL, prostate cancer, hypothyroidism, anxiety, history of diverticulitis, osteoarthritis, upper thyroidism, hyperlipidemia who presents to the hospital due to shortness of breath and cough. Patient apparently has been having some right hip pain from his previous history of hip replacement and saw his orthopedic surgeon for follow-up today and during his evaluation he noticed the patient was quite short of breath and therefore sent him to the ER for further evaluation. As per the wife patient did not sleep well last night and has been having gurgling sounds and feeling short of breath now since last night. He admits to a cough which is productive of green-yellow sputum, with chills but no documented fever. He denies any chest pain, nausea, abdominal pain, diarrhea or any other associated symptoms. He presented to the hospital and was noted to be in acute respiratory failure with hypoxia likely secondary to pneumonia and hospitalist services were contacted for further treatment and evaluation.  PAST MEDICAL HISTORY:   Past Medical History  Diagnosis Date  . Incontinence   . Arthritis   . Cancer (HCC)     CLL prostate  . Melanoma ([place])     scalp  . Diverticulitis   . CLL (chronic lymphocytic leukemia) ([place]) 2003  . Hypothyroidism   . Anxiety   . Medical history non-contributory     PAST SURGICAL HISTORY:   Past Surgical History  Procedure Laterality Date  . Replacement total knee      x2  . Prostatectomy  1993  . Colonoscopy  2013-2014?  Marland Kitchen Total  hip arthroplasty Right 08/12/2014    Procedure: TOTAL HIP ARTHROPLASTY ANTERIOR APPROACH;  Surgeon: Hessie Knows, MD;  Location: [facility] ORS;  Service: Orthopedics;  Laterality: Right;  . Mohs surgery Right 10/2014    eyebrow    SOCIAL HISTORY:   Social History  Substance Use Topics  . Smoking status: Former Smoker    Quit date: 03/24/1990  . Smokeless tobacco: Never Used  . Alcohol Use: No     Comment: occasionally    FAMILY HISTORY:   Family History  Problem Relation Age of Onset  . Heart attack Mother   . Heart attack Father     DRUG ALLERGIES:  No Known Allergies  REVIEW OF SYSTEMS:   Review of Systems  Constitutional: Positive for malaise/fatigue. Negative for fever and weight loss.  HENT: Negative for congestion, nosebleeds and tinnitus.   Eyes: Negative for blurred vision, double vision and redness.  Respiratory: Positive for cough, sputum production and shortness of breath. Negative for hemoptysis.   Cardiovascular: Negative for chest pain, orthopnea, leg swelling and PND.  Gastrointestinal: Negative for nausea, vomiting, abdominal pain, diarrhea and melena.  Genitourinary: Negative for dysuria, urgency and hematuria.  Musculoskeletal: Positive for joint pain (right hip pain). Negative for falls.  Neurological: Positive for weakness. Negative for dizziness, tingling, sensory change, focal weakness, seizures and headaches.  Endo/Heme/Allergies: Negative for polydipsia. Does not bruise/bleed easily.  Psychiatric/Behavioral: Negative for depression and memory loss. The patient is not nervous/anxious.  MEDICATIONS AT HOME:   Prior to Admission medications   Medication Sig Start Date End Date Taking? Authorizing Provider  cetirizine (ZYRTEC) 10 MG tablet Take 10 mg by mouth daily.   Yes Historical Provider, MD  Cholecalciferol (VITAMIN D3) 2000 UNITS capsule Take by mouth.   Yes Historical Provider, MD  fluticasone (FLONASE) 50 MCG/ACT nasal spray SPRAY TWICE IN  EACH NOSTRIL ONCE DAILY 11/17/14  Yes Historical Provider, MD  furosemide (LASIX) 20 MG tablet Take by mouth.   Yes Historical Provider, MD  HYDROcodone-acetaminophen (NORCO/VICODIN) 5-325 MG tablet Take 1 tablet by mouth every 6 (six) hours as needed for moderate pain or severe pain.  11/28/14  Yes Historical Provider, MD  ibrutinib (IMBRUVICA) 140 MG capsul Take 2 capsules (280 mg total) by mouth daily. Patient taking differently: Take 420 mg by mouth daily.  12/07/14  Yes Leia Alf, MD  levothyroxine (SYNTHROID, LEVOTHROID) 125 MCG tablet Take 125 mcg by mouth daily.   Yes Historical Provider, MD  levothyroxine (SYNTHROID, LEVOTHROID) 75 MCG tablet Take 75 mcg by mouth daily before breakfast.   Yes Historical Provider, MD  lidocaine-prilocaine (EMLA) cream Apply cream 1 hour before chemotherapy treatment 11/03/14  Yes Leia Alf, MD  methocarbamol (ROBAXIN) 500 MG tablet Take 500 mg by mouth 2 (two) times daily. 01/25/15 02/04/15 Yes Historical Provider, MD  potassium chloride (MICRO-K) 10 MEQ CR capsule Take by mouth.   Yes Historical Provider, MD  sertraline (ZOLOFT) 50 MG tablet Take 50 mg by mouth at bedtime.  09/28/13  Yes Historical Provider, MD  simvastatin (ZOCOR) 40 MG tablet Take 40 mg by mouth daily at 6 PM.  08/20/13  Yes Historical Provider, MD  zolpidem (AMBIEN) 5 MG tablet Take 5 mg by mouth at bedtime.   Yes Historical Provider, MD      VITAL SIGNS:  Blood pressure 163/90, pulse 122, temperature 98 F (36.7 C), temperature source Oral, resp. rate 38, height 5\' 10"  (1.778 m), weight 108.863 kg (240 lb), SpO2 86 %.  PHYSICAL EXAMINATION:  Physical Exam  GENERAL:  74 y.o.-year-old patient lying in the bed in mild respiratory distress  EYES: Pupils equal, round, reactive to light and accommodation. No scleral icterus. Extraocular muscles intact.  HEENT: Head atraumatic, normocephalic. Oropharynx and nasopharynx clear. No oropharyngeal erythema, moist oral mucosa  NECK:   Supple, no jugular venous distention. No thyroid enlargement, no tenderness.  LUNGS: Diffuse rhonchi bilaterally, positive use of accessory muscles. No dullness to percussion. CARDIOVASCULAR: S1, S2 RRR, tachycardic. No murmurs, rubs, gallops, clicks.  ABDOMEN: Soft, nontender, nondistended. Bowel sounds present. No organomegaly or mass.  EXTREMITIES: No pedal edema, cyanosis, or clubbing. + 2 pedal & radial pulses b/l.   NEUROLOGIC: Cranial nerves II through XII are intact. No focal Motor or sensory deficits appreciated b/l PSYCHIATRIC: The patient is alert and oriented x 3. Good affect.  SKIN: No obvious rash, lesion, or ulcer.   LABORATORY PANEL:   CBC  Recent Labs Lab 02/15/2015 1719  WBC 303.3*  HGB 11.0*  HCT 35.1*  PLT 107*   ------------------------------------------------------------------------------------------------------------------  Chemistries   Recent Labs Lab 02/12/2015 1719  NA 131*  K 4.0  CL 100*  CO2 23  GLUCOSE 155*  BUN 22*  CREATININE 1.14  CALCIUM 8.7*  AST 21  ALT 13*  ALKPHOS 82  BILITOT 2.1*   ------------------------------------------------------------------------------------------------------------------  Cardiac Enzymes No results for input(s): TROPONINI in the last 168 hours. ------------------------------------------------------------------------------------------------------------------  RADIOLOGY:  Dg Chest Port 1 View  02/13/2015  CLINICAL DATA:  Shortness of breath and cough for 1 day EXAM: PORTABLE CHEST 1 VIEW COMPARISON:  Aug 14, 2014 FINDINGS: There is patchy infiltrate in the medial right base. The lungs elsewhere clear. Heart is upper normal in size with pulmonary vascularity within normal limits. No adenopathy. Port-A-Cath tip is in the superior vena cava. No pneumothorax. IMPRESSION: Patchy infiltrate medial right base. Lungs elsewhere clear. No change in cardiac silhouette. Electronically Signed   By: Lowella Grip III M.D.    On: 02/08/2015 17:44     IMPRESSION AND PLAN:   74 year old male with past medical history of CLL, hypothyroidism, depression, hyperlipidemia, history of prostate cancer, who presented to the hospital due to shortness of breath, weakness, sputum production and noted to be in acute respiratory failure with hypoxia.  #1 acute respiratory failure with hypoxia-this is likely secondary to pneumonia. Patient's chest x-ray suggestive of an infiltrate. His ABG is consistent with significant hypoxemia. -Since he is immunosuppressed given his underlying malignancy treated with broad-spectrum IV antibiotics with vancomycin, Zosyn. Follow blood and sputum cultures and follow him clinically. -Patient has been started on BiPAP by the ER physician and will continue. I will recheck an ABG in the next 2 hours.  #2 pneumonia-likely the cause of patient's acute respiratory failure with hypoxia. -We'll treat with IV mycin, Zosyn. Follow blood and sputum cultures.  #3 history of CLL-patient's white cell count is significantly elevated. -I will consult oncology. I will hold his chemotherapy med given his acute infectious process.  #4 hypothyroidism-continue Synthroid.  #5 hyperlipidemia-continue simvastatin.  #6 depression, continue Zoloft.   All the records are reviewed and case discussed with ED provider. Management plans discussed with the patient, family and they are in agreement.  CODE STATUS: Full  TOTAL CRITICAL CARE TIME TAKING CARE OF THIS PATIENT: 50 minutes.    Henreitta Leber M.D on 02/03/2015 at 7:53 PM  Between 7am to 6pm - Pager - [phone]  After 6pm go to [email or web address] - password [email or web address] [facility]  Office  [phone]  CC: Primary care physician; Idelle Crouch, MD

## 2015-01-31 NOTE — ED Notes (Signed)
Patient having difficulty keeping bi-pap on. Refuses anymore ativan at this time. Will continue to try bi-pap.

## 2015-01-31 NOTE — Progress Notes (Signed)
ANTIBIOTIC CONSULT NOTE - INITIAL  Pharmacy Consult for Vancomycin/Zosyn Indication: pneumonia  No Known Allergies  Patient Measurements: Height: 5\' 10"  (177.8 cm) Weight: 240 lb (108.863 kg) IBW/kg (Calculated) : 73 Adjusted Body Weight:   Vital Signs: Temp: 100.5 F (38.1 C) (11/09 2107) Temp Source: Axillary (11/09 2107) BP: 149/82 mmHg (11/09 2000) Pulse Rate: 120 (11/09 2015) Intake/Output from previous day:   Intake/Output from this shift:    Labs:  Recent Labs  02/09/2015 1719  WBC 303.3*  HGB 11.0*  PLT 107*  CREATININE 1.14   Estimated Creatinine Clearance: 69.2 mL/min (by C-G formula based on Cr of 1.14). No results for input(s): VANCOTROUGH, VANCOPEAK, VANCORANDOM, GENTTROUGH, GENTPEAK, GENTRANDOM, TOBRATROUGH, TOBRAPEAK, TOBRARND, AMIKACINPEAK, AMIKACINTROU, AMIKACIN in the last 72 hours.   Microbiology: Recent Results (from the past 720 hour(s))  Culture, sputum-assessment     Status: None   Collection Time: 02/07/2015  5:20 PM  Result Value Ref Range Status   Specimen Description SPUTUM  Final   Special Requests Immunocompromised  Final   Sputum evaluation THIS SPECIMEN IS ACCEPTABLE FOR SPUTUM CULTURE  Final   Report Status 02/01/2015 FINAL  Final    Medical History: Past Medical History  Diagnosis Date  . Incontinence   . Arthritis   . Cancer (HCC)     CLL prostate  . Melanoma ([place])     scalp  . Diverticulitis   . CLL (chronic lymphocytic leukemia) ([place]) 2003  . Hypothyroidism   . Anxiety   . Medical history non-contributory     Medications:  Prescriptions prior to admission  Medication Sig Dispense Refill Last Dose  . cetirizine (ZYRTEC) 10 MG tablet Take 10 mg by mouth daily.   01/25/2015 at Unknown time  . Cholecalciferol (VITAMIN D3) 2000 UNITS capsule Take by mouth.   01/30/2015 at Unknown time  . fluticasone (FLONASE) 50 MCG/ACT nasal spray SPRAY TWICE IN EACH NOSTRIL ONCE DAILY  12 01/30/2015 at Unknown time  . furosemide (LASIX)  20 MG tablet Take by mouth.   prn at prn  . HYDROcodone-acetaminophen (NORCO/VICODIN) 5-325 MG tablet Take 1 tablet by mouth every 6 (six) hours as needed for moderate pain or severe pain.    01/30/2015 at Unknown time  . ibrutinib (IMBRUVICA) 140 MG capsul Take 2 capsules (280 mg total) by mouth daily. (Patient taking differently: Take 420 mg by mouth daily. ) 60 capsule 3 02/15/2015 at Unknown time  . levothyroxine (SYNTHROID, LEVOTHROID) 125 MCG tablet Take 125 mcg by mouth daily.   01/30/2015 at Unknown time  . levothyroxine (SYNTHROID, LEVOTHROID) 75 MCG tablet Take 75 mcg by mouth daily before breakfast.   01/30/2015 at Unknown time  . lidocaine-prilocaine (EMLA) cream Apply cream 1 hour before chemotherapy treatment 30 g 1 Past Month at Unknown time  . methocarbamol (ROBAXIN) 500 MG tablet Take 500 mg by mouth 2 (two) times daily.  0 01/30/2015 at Unknown time  . potassium chloride (MICRO-K) 10 MEQ CR capsule Take by mouth.   prn at prn  . sertraline (ZOLOFT) 50 MG tablet Take 50 mg by mouth at bedtime.    01/30/2015 at Unknown time  . simvastatin (ZOCOR) 40 MG tablet Take 40 mg by mouth daily at 6 PM.    01/30/2015 at Unknown time  . zolpidem (AMBIEN) 5 MG tablet Take 5 mg by mouth at bedtime.   01/30/2015 at Unknown time   Assessment: Pseudomonas risk factors:   Pt has lymphoma and is on Imbruvica (immunosuppressive ) at home.  CrCl = 69.2 ml/min Ke = 0.06 hrs-1 T1/2 = 11.6 hrs Vd = 61.2 L   Goal of Therapy:  Vancomycin trough level 15-20 mcg/ml  Plan:  Expected duration 7 days with resolution of temperature and/or normalization of WBC   Vancomycin 1 gm IV X 1 given on 11/09 @ 19:00. Vancomycin 1 gm IV Q12H ordered to start 11/10 @ 7:00.  Stacked dosing not appropriate for this pt. This pt will reach Css on 11/11 @ 19:00. Will draw 1st trough on 11/11 @ 18:30.   Kadie Balestrieri D 01/30/2015,10:32 PM

## 2015-01-31 NOTE — ED Notes (Signed)
Pt presents with shortness of breath and cough started yesterday. Pt noted to be short of breath.

## 2015-01-31 NOTE — ED Provider Notes (Signed)
[facility] Emergency Department Provider Note  ____________________________________________  Time seen: Approximately 5:16 PM  I have reviewed the triage vital signs and the nursing notes.   HISTORY  Chief Complaint Shortness of Breath    HPI Dean Collins is a 74 y.o. male whose chemotherapy was just increased. Wife reports last time this happened he became short of breath. Patient was somewhat short of breath yesterday however last night he became very short of breath and developed a fever develop for a hot and developed cough productive of brown sputum. This is still going on now. Patient is also very short of breath and says his chest hurts from coughing. He has no other associated symptoms. Past Medical History  Diagnosis Date  . Incontinence   . Arthritis   . Cancer (HCC)     CLL prostate  . Melanoma ([place])     scalp  . Diverticulitis   . 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 Hypothyroidism   . Anxiety   . Medical history non-contributory     Patient Active Problem List   Diagnosis Date Noted  . Acute respiratory failure with hypoxia ([place]) 02/01/2015  . CLL (chronic lymphocytic leukemia) ([place]) 08/30/2014  . Femoral neck fracture ([place]) 08/11/2014  . Lymphoma ([place]) 11/11/2013  . Splenomegaly 11/11/2013  . Lymphadenopathy of head and neck 11/11/2013    Past Surgical History  Procedure Laterality Date  . Replacement total knee      x2  . Prostatectomy  1993  . Colonoscopy  2013-2014?  Marland Kitchen Total hip arthroplasty Right 08/12/2014    Procedure: TOTAL HIP ARTHROPLASTY ANTERIOR APPROACH;  Surgeon: Hessie Knows, MD;  Location: [facility] ORS;  Service: Orthopedics;  Laterality: Right;  . Mohs surgery Right 10/2014    eyebrow    No current outpatient prescriptions on file.  Allergies Review of patient's allergies indicates no known allergies.  Family History  Problem Relation Age of Onset  . Heart attack Mother   . Heart attack Father      Social History Social History  Substance Use Topics  . Smoking status: Former Smoker    Quit date: 03/24/1990  . Smokeless tobacco: Never Used  . Alcohol Use: No     Comment: occasionally    Review of Systems Constitutional:  fever/chills Eyes: No visual changes. ENT: No sore throat. Cardiovascular: chest pain. Respiratory:  shortness of breath. Gastrointestinal: No abdominal pain.  No nausea, no vomiting.  No diarrhea.  No constipation. Genitourinary: Negative for dysuria. Musculoskeletal: Negative for back pain. Skin: Negative for rash. Neurological: Negative for headaches, focal weakness or numbness.  10-point ROS otherwise negative.  ____________________________________________   PHYSICAL EXAM:  VITAL SIGNS: ED Triage Vitals  Enc Vitals Group     BP 02/15/2015 1644 163/90 mmHg     Pulse Rate 01/28/2015 1644 122     Resp 02/14/2015 1644 38     Temp 02/21/2015 1644 98 F (36.7 C)     Temp Source 02/19/2015 1644 Oral     SpO2 02/13/2015 1644 86 %     Weight 01/30/2015 1644 240 lb (108.863 kg)     Height 02/14/2015 1644 5\' 10"  (1.778 m)     Head Cir --      Peak Flow --      Pain Score 02/13/2015 1647 5     Pain Loc --      Pain Edu? --      Excl. in [place]? --     Constitutional:  Alert and oriented. Short of breath Eyes: Conjunctivae are normal. PERRL. EOMI. Head: Atraumatic. Nose: No congestion/rhinnorhea. Mouth/Throat: Mucous membranes are moist.  Oropharynx non-erythematous. Neck: No stridor.  Cardiovascular: Normal rate, regular rhythm. Grossly normal heart sounds.  Good peripheral circulation. Respiratory: Increased respiratory effort no retractions he has wheezes and crackles throughout. Gastrointestinal: Soft and nontender. Wife reports his belly somewhat distended No distention. No abdominal bruits. No CVA tenderness. Musculoskeletal: No lower extremity tenderness nor edema.  No joint effusions. Neurologic:  Normal speech and language. No gross focal neurologic  deficits are appreciated. No gait instability. Skin:  Skin is warm, dry and intact. No rash noted.   ____________________________________________   LABS (all labs ordered are listed, but only abnormal results are displayed)  Labs Reviewed  COMPREHENSIVE METABOLIC PANEL - Abnormal; Notable for the following:    Sodium 131 (*)    Chloride 100 (*)    Glucose, Bld 155 (*)    BUN 22 (*)    Calcium 8.7 (*)    ALT 13 (*)    Total Bilirubin 2.1 (*)    All other components within normal limits  CBC WITH DIFFERENTIAL/PLATELET - Abnormal; Notable for the following:    WBC 303.3 (*)    RBC 3.76 (*)    Hemoglobin 11.0 (*)    HCT 35.1 (*)    MCHC 31.3 (*)    RDW 16.1 (*)    Platelets 107 (*)    Neutro Abs 41.9 (*)    Lymphs Abs 256.2 (*)    Monocytes Absolute 4.5 (*)    All other components within normal limits  BRAIN NATRIURETIC PEPTIDE - Abnormal; Notable for the following:    B Natriuretic Peptide 101.0 (*)    All other components within normal limits  BLOOD GAS, ARTERIAL - Abnormal; Notable for the following:    pH, Arterial 7.47 (*)    pO2, Arterial 55 (*)    All other components within normal limits  URINALYSIS COMPLETEWITH MICROSCOPIC ([facility] ONLY) - Abnormal; Notable for the following:    Color, Urine YELLOW (*)    APPearance HAZY (*)    Ketones, ur TRACE (*)    Hgb urine dipstick 3+ (*)    Protein, ur 100 (*)    Squamous Epithelial / LPF 0-5 (*)    All other components within normal limits  TSH - Abnormal; Notable for the following:    TSH 8.274 (*)    All other components within normal limits  TROPONIN I - Abnormal; Notable for the following:    Troponin I 0.07 (*)    All other components within normal limits  GLUCOSE, CAPILLARY - Abnormal; Notable for the following:    Glucose-Capillary 159 (*)    All other components within normal limits  CULTURE, EXPECTORATED SPUTUM-ASSESSMENT  CULTURE, BLOOD (ROUTINE X 2)  CULTURE, BLOOD (ROUTINE X 2)  URINE CULTURE  GRAM  STAIN  URINE CULTURE  CULTURE, RESPIRATORY (NON-EXPECTORATED)  CULTURE, EXPECTORATED SPUTUM-ASSESSMENT  MRSA PCR SCREENING  LACTIC ACID, PLASMA  LACTIC ACID, PLASMA  BLOOD GAS, ARTERIAL  BASIC METABOLIC PANEL  CBC   ____________________________________________  EKG  EKG shows atrial fibrillation at a rate of 137. Appears to have a left axis was difficult to tell because of baseline is very irregular there does not appear to be any acute ST-T wave changes. Afterwards EKG changes to sinus tachycardia about 128 heart rate gradually slows down when he he after that develops further PACs ____________________________________________  RADIOLOGY chest x-ray shows some infiltrate  which in view of his clinical history is most consistent with pneumonia ____________________________________________   PROCEDURES    ____________________________________________   INITIAL IMPRESSION / ASSESSMENT AND PLAN / ED COURSE  Pertinent labs & imaging results that were available during my care of the patient were reviewed by me and considered in my medical decision making (see chart for details).  ____________________________________________   FINAL CLINICAL IMPRESSION(S) / ED DIAGNOSES  Final diagnoses:  Encounter for nasogastric (NG) tube placement  Encounter for intubation  Encounter for nasogastric (NG) tube placement  Encounter for intubation   Final diagnosis is none of the above final diagnosis is sepsis likely pneumonia Medical care time is 20 minutes    Nena Polio, MD 02/21/2015 2255

## 2015-01-31 NOTE — Progress Notes (Signed)
eLink Physician-Brief Progress Note Patient Name: Dean Collins DOB: 1940-10-18 MRN: [id number]   Date of Service  02/04/2015  HPI/Events of Note  resp failure PAF with RVR Borderline hypotension  eICU Interventions  NS 500 cc bolus X 1 Amiodarone gtt Low dose PRN metoprolol for HR > 115 Sedation orders DVT px, SUP     Intervention Category Major Interventions: Arrhythmia - evaluation and management;Respiratory failure - evaluation and management  Montford Barg 01/28/2015, 11:41 PM

## 2015-02-01 ENCOUNTER — Telehealth: Payer: Self-pay | Admitting: *Deleted

## 2015-02-01 ENCOUNTER — Inpatient Hospital Stay: Payer: Medicare Other

## 2015-02-01 ENCOUNTER — Encounter: Payer: Self-pay | Admitting: Nurse Practitioner

## 2015-02-01 DIAGNOSIS — Z9911 Dependence on respirator [ventilator] status: Secondary | ICD-10-CM

## 2015-02-01 DIAGNOSIS — J189 Pneumonia, unspecified organism: Secondary | ICD-10-CM

## 2015-02-01 DIAGNOSIS — D72829 Elevated white blood cell count, unspecified: Secondary | ICD-10-CM

## 2015-02-01 DIAGNOSIS — J9601 Acute respiratory failure with hypoxia: Secondary | ICD-10-CM

## 2015-02-01 DIAGNOSIS — J181 Lobar pneumonia, unspecified organism: Secondary | ICD-10-CM

## 2015-02-01 DIAGNOSIS — I4891 Unspecified atrial fibrillation: Secondary | ICD-10-CM

## 2015-02-01 DIAGNOSIS — I480 Paroxysmal atrial fibrillation: Secondary | ICD-10-CM

## 2015-02-01 DIAGNOSIS — C9112 Chronic lymphocytic leukemia of B-cell type in relapse: Secondary | ICD-10-CM

## 2015-02-01 DIAGNOSIS — E039 Hypothyroidism, unspecified: Secondary | ICD-10-CM

## 2015-02-01 HISTORY — DX: Pneumonia, unspecified organism: J18.9

## 2015-02-01 LAB — GLUCOSE, CAPILLARY
Glucose-Capillary: 125 mg/dL — ABNORMAL HIGH (ref 65–99)
Glucose-Capillary: 131 mg/dL — ABNORMAL HIGH (ref 65–99)
Glucose-Capillary: 137 mg/dL — ABNORMAL HIGH (ref 65–99)

## 2015-02-01 LAB — BASIC METABOLIC PANEL
Anion gap: 10 (ref 5–15)
BUN: 25 mg/dL — AB (ref 6–20)
CO2: 24 mmol/L (ref 22–32)
CREATININE: 1.38 mg/dL — AB (ref 0.61–1.24)
Calcium: 8.2 mg/dL — ABNORMAL LOW (ref 8.9–10.3)
Chloride: 102 mmol/L (ref 101–111)
GFR calc Af Amer: 56 mL/min — ABNORMAL LOW (ref >60)
GFR, EST NON AFRICAN AMERICAN: 48 mL/min — AB (ref >60)
GLUCOSE: 176 mg/dL — AB (ref 65–99)
Potassium: 3.4 mmol/L — ABNORMAL LOW (ref 3.5–5.1)
SODIUM: 136 mmol/L (ref 135–145)

## 2015-02-01 LAB — MAGNESIUM: Magnesium: 1.6 mg/dL — ABNORMAL LOW (ref 1.7–2.4)

## 2015-02-01 LAB — CBC
HCT: 28 % — ABNORMAL LOW (ref 40.0–52.0)
Hemoglobin: 8.8 g/dL — ABNORMAL LOW (ref 13.0–18.0)
MCH: 30 pg (ref 26.0–34.0)
MCHC: 31.6 g/dL — AB (ref 32.0–36.0)
MCV: 95 fL (ref 80.0–100.0)
PLATELETS: 79 10*3/uL — AB (ref 150–440)
RBC: 2.95 MIL/uL — ABNORMAL LOW (ref 4.40–5.90)
RDW: 15.6 % — AB (ref 11.5–14.5)
WBC: 216.4 10*3/uL — AB (ref 3.8–10.6)

## 2015-02-01 LAB — PHOSPHORUS: Phosphorus: 3.7 mg/dL (ref 2.5–4.6)

## 2015-02-01 LAB — STREP PNEUMONIAE URINARY ANTIGEN: STREP PNEUMO URINARY ANTIGEN: NEGATIVE

## 2015-02-01 LAB — MRSA PCR SCREENING: MRSA by PCR: NEGATIVE

## 2015-02-01 LAB — TROPONIN I
TROPONIN I: 0.09 ng/mL — AB (ref <0.031)
Troponin I: 0.08 ng/mL — ABNORMAL HIGH (ref <0.031)

## 2015-02-01 LAB — CK
CK TOTAL: 42 U/L — AB (ref 49–397)
Total CK: 35 U/L — ABNORMAL LOW (ref 49–397)

## 2015-02-01 LAB — TRIGLYCERIDES: TRIGLYCERIDES: 90 mg/dL (ref <150)

## 2015-02-01 MED ORDER — PRO-STAT SUGAR FREE PO LIQD
30.0000 mL | Freq: Four times a day (QID) | ORAL | Status: DC
  Administered 2015-02-01 (×2): 30 mL
  Administered 2015-02-01: 21:00:00

## 2015-02-01 MED ORDER — ANTISEPTIC ORAL RINSE SOLUTION (CORINZ)
7.0000 mL | Freq: Four times a day (QID) | OROMUCOSAL | Status: DC
  Administered 2015-02-01 – 2015-02-10 (×30): 7 mL via OROMUCOSAL
  Filled 2015-02-01 (×40): qty 7

## 2015-02-01 MED ORDER — VITAL HIGH PROTEIN PO LIQD
1000.0000 mL | ORAL | Status: DC
  Administered 2015-02-01: 1000 mL

## 2015-02-01 MED ORDER — POTASSIUM CHLORIDE CRYS ER 20 MEQ PO TBCR: 40.0000 meq | EXTENDED_RELEASE_TABLET | Freq: Once | ORAL | Status: DC

## 2015-02-01 MED ORDER — PRO-STAT SUGAR FREE PO LIQD: 30.0000 mL | Freq: Three times a day (TID) | ORAL | Status: DC

## 2015-02-01 MED ORDER — SODIUM CHLORIDE 0.9 % IJ SOLN
INTRAMUSCULAR | Status: AC
  Administered 2015-02-01: 12:00:00
  Filled 2015-02-01: qty 10

## 2015-02-01 MED ORDER — PROPOFOL 1000 MG/100ML IV EMUL: 5.0000 ug/kg/min | INTRAVENOUS | Status: DC

## 2015-02-01 MED ORDER — PHENYLEPHRINE HCL 10 MG/ML IJ SOLN: 0.0000 ug/min | INTRAMUSCULAR | Status: DC

## 2015-02-01 MED ORDER — LEVOTHYROXINE SODIUM 100 MCG IV SOLR
62.5000 ug | Freq: Every day | INTRAVENOUS | Status: DC
  Administered 2015-02-01 – 2015-02-03 (×3): 62.5 ug via INTRAVENOUS
  Filled 2015-02-01 (×3): qty 5

## 2015-02-01 MED ORDER — PHENYLEPHRINE HCL 10 MG/ML IJ SOLN
0.0000 ug/min | INTRAMUSCULAR | Status: DC
  Filled 2015-02-01: qty 4

## 2015-02-01 MED ORDER — POTASSIUM CHLORIDE 20 MEQ PO PACK
40.0000 meq | PACK | Freq: Once | ORAL | Status: AC
  Administered 2015-02-01: 40 meq via ORAL
  Filled 2015-02-01: qty 2

## 2015-02-01 MED ORDER — PROPOFOL 1000 MG/100ML IV EMUL
0.0000 ug/kg/min | INTRAVENOUS | Status: DC
  Administered 2015-02-01: 25 ug/kg/min via INTRAVENOUS
  Administered 2015-02-01 (×2): 40 ug/kg/min via INTRAVENOUS
  Administered 2015-02-01: 30 ug/kg/min via INTRAVENOUS
  Administered 2015-02-02 (×3): 40 ug/kg/min via INTRAVENOUS
  Filled 2015-02-01 (×7): qty 100

## 2015-02-01 MED ORDER — PROPOFOL 1000 MG/100ML IV EMUL
INTRAVENOUS | Status: AC
Start: 2015-02-01 — End: 2015-02-01
  Administered 2015-02-01: 30 ug/kg/min via INTRAVENOUS
  Filled 2015-02-01: qty 100

## 2015-02-01 MED ORDER — FREE WATER
100.0000 mL | Freq: Three times a day (TID) | Status: DC
  Administered 2015-02-01 – 2015-02-02 (×3): 100 mL

## 2015-02-01 MED ORDER — CHLORHEXIDINE GLUCONATE 0.12% ORAL RINSE (MEDLINE KIT)
15.0000 mL | Freq: Two times a day (BID) | OROMUCOSAL | Status: DC
  Administered 2015-02-01 – 2015-02-10 (×18): 15 mL via OROMUCOSAL
  Filled 2015-02-01 (×21): qty 15

## 2015-02-01 NOTE — Consult Note (Signed)
PULMONARY / CRITICAL CARE MEDICINE   Name: Dean Collins MRN: [id number] DOB: 1940-06-10    ADMISSION DATE:  02/20/2015 CONSULTATION DATE:  02/01/15  REFERRING MD :  Dr. Verdell Carmine   CHIEF COMPLAINT:     Sob/respiratory failure   HISTORY OF PRESENT ILLNESS per chart review  74 y.o. male with a known history of CLL, prostate cancer, hypothyroidism, anxiety, history of diverticulitis, osteoarthritis, upper thyroidism, hyperlipidemia who presents to the hospital due to shortness of breath and cough. Patient apparently has been having some right hip pain from his previous history of hip replacement and saw his orthopedic surgeon for follow-up today and during his evaluation he noticed the patient was quite short of breath and therefore sent him to the ER for further evaluation. As per the wife patient did not sleep well last night and has been having gurgling sounds and feeling short of breath now since last night. He admits to a cough which is productive of green-yellow sputum, with chills but no documented fever. He denies any chest pain, nausea, abdominal pain, diarrhea or any other associated symptoms. He presented to the hospital and was noted to be in acute respiratory failure with hypoxia likely secondary to pneumonia and was started on Bipap, however his respiratory status worsen after he could not tolerate bipap and he was subsequently intubated. Throughout the night he was noted to have new onset atrial fibrillation and was started on an amiodarone drip; currently he is back in NSR.,   SIGNIFICANT EVENTS  11/9>>admitted for respiratory distress>>intubated    PAST MEDICAL HISTORY    :  Past Medical History  Diagnosis Date  . Incontinence   . Arthritis   . Cancer (HCC)     CLL prostate  . Melanoma ([place])     scalp  . Diverticulitis   . 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 Hypothyroidism   . Anxiety   . Medical history non-contributory    Past Surgical History   Procedure Laterality Date  . Replacement total knee      x2  . Prostatectomy  1993  . Colonoscopy  2013-2014?  Marland Kitchen Total hip arthroplasty Right 08/12/2014    Procedure: TOTAL HIP ARTHROPLASTY ANTERIOR APPROACH;  Surgeon: Hessie Knows, MD;  Location: [facility] ORS;  Service: Orthopedics;  Laterality: Right;  . Mohs surgery Right 10/2014    eyebrow   Prior to Admission medications   Medication Sig Start Date End Date Taking? Authorizing Provider  cetirizine (ZYRTEC) 10 MG tablet Take 10 mg by mouth daily.   Yes Historical Provider, MD  Cholecalciferol (VITAMIN D3) 2000 UNITS capsule Take by mouth.   Yes Historical Provider, MD  fluticasone (FLONASE) 50 MCG/ACT nasal spray SPRAY TWICE IN EACH NOSTRIL ONCE DAILY 11/17/14  Yes Historical Provider, MD  furosemide (LASIX) 20 MG tablet Take by mouth.   Yes Historical Provider, MD  HYDROcodone-acetaminophen (NORCO/VICODIN) 5-325 MG tablet Take 1 tablet by mouth every 6 (six) hours as needed for moderate pain or severe pain.  11/28/14  Yes Historical Provider, MD  ibrutinib (IMBRUVICA) 140 MG capsul Take 2 capsules (280 mg total) by mouth daily. Patient taking differently: Take 420 mg by mouth daily.  12/07/14  Yes Leia Alf, MD  levothyroxine (SYNTHROID, LEVOTHROID) 125 MCG tablet Take 125 mcg by mouth daily.   Yes Historical Provider, MD  levothyroxine (SYNTHROID, LEVOTHROID) 75 MCG tablet Take 75 mcg by mouth daily before breakfast.   Yes Historical Provider, MD  lidocaine-prilocaine (EMLA) cream Apply cream 1  hour before chemotherapy treatment 11/03/14  Yes Leia Alf, MD  methocarbamol (ROBAXIN) 500 MG tablet Take 500 mg by mouth 2 (two) times daily. 01/25/15 02/04/15 Yes Historical Provider, MD  potassium chloride (MICRO-K) 10 MEQ CR capsule Take by mouth.   Yes Historical Provider, MD  sertraline (ZOLOFT) 50 MG tablet Take 50 mg by mouth at bedtime.  09/28/13  Yes Historical Provider, MD  simvastatin (ZOCOR) 40 MG tablet Take 40 mg by mouth daily at  6 PM.  08/20/13  Yes Historical Provider, MD  zolpidem (AMBIEN) 5 MG tablet Take 5 mg by mouth at bedtime.   Yes Historical Provider, MD   No Known Allergies   FAMILY HISTORY   Family History  Problem Relation Age of Onset  . Heart attack Mother   . Heart attack Father       SOCIAL HISTORY    reports that he quit smoking about 24 years ago. He has never used smokeless tobacco. He reports that he does not drink alcohol or use illicit drugs.  ROS    VITAL SIGNS    Temp:  [97.6 F (36.4 C)-100.5 F (38.1 C)] 97.6 F (36.4 C) (11/10 0400) Pulse Rate:  [62-141] 70 (11/10 0500) Resp:  [17-42] 19 (11/10 0500) BP: (76-196)/(48-90) 93/57 mmHg (11/10 0500) SpO2:  [77 %-99 %] 99 % (11/10 0500) FiO2 (%):  [40 %-80 %] 70 % (11/10 0520) Weight:  [240 lb (108.863 kg)] 240 lb (108.863 kg) (11/09 1644) HEMODYNAMICS:   VENTILATOR SETTINGS: Vent Mode:  [-] PRVC FiO2 (%):  [40 %-80 %] 70 % Set Rate:  [15 bmp] 15 bmp Vt Set:  [500 mL-580 mL] 580 mL PEEP:  [5 cmH20] 5 cmH20 INTAKE / OUTPUT:  Intake/Output Summary (Last 24 hours) at 02/01/15 0754 Last data filed at 02/01/15 0546  Gross per 24 hour  Intake 475.27 ml  Output    150 ml  Net 325.27 ml       PHYSICAL EXAM   Physical Exam     LABS   LABS:  CBC  Recent Labs Lab 01/26/15 1029 02/07/2015 1719 02/01/15 0434  WBC 212.6* 303.3* 216.4*  HGB 11.3* 11.0* 8.8*  HCT 35.7* 35.1* 28.0*  PLT 108* 107* 79*   Coag's No results for input(s): APTT, INR in the last 168 hours. BMET  Recent Labs Lab 02/19/2015 1719 02/01/15 0434  NA 131* 136  K 4.0 3.4*  CL 100* 102  CO2 23 24  BUN 22* 25*  CREATININE 1.14 1.38*  GLUCOSE 155* 176*   Electrolytes  Recent Labs Lab 01/29/2015 1719 02/01/15 0434  CALCIUM 8.7* 8.2*   Sepsis Markers  Recent Labs Lab 02/03/2015 1713 02/14/2015 2244  LATICACIDVEN 1.6 1.5   ABG  Recent Labs Lab 01/23/2015 1735 01/30/2015 2303  PHART 7.47* 7.35  PCO2ART 32 41  PO2ART 55*  93   Liver Enzymes  Recent Labs Lab 02/07/2015 1719  AST 21  ALT 13*  ALKPHOS 82  BILITOT 2.1*  ALBUMIN 3.9   Cardiac Enzymes  Recent Labs Lab 01/24/2015 1719  TROPONINI 0.07*   Glucose  Recent Labs Lab 02/03/2015 2107  GLUCAP 159*     Recent Results (from the past 240 hour(s))  Culture, sputum-assessment     Status: None   Collection Time: 02/21/2015  5:20 PM  Result Value Ref Range Status   Specimen Description SPUTUM  Final   Special Requests Immunocompromised  Final   Sputum evaluation THIS SPECIMEN IS ACCEPTABLE FOR SPUTUM CULTURE  Final  Report Status 02/12/2015 FINAL  Final  MRSA PCR Screening     Status: None   Collection Time: 02/09/2015 10:47 PM  Result Value Ref Range Status   MRSA by PCR NEGATIVE NEGATIVE Final    Comment:        The GeneXpert MRSA Assay (FDA approved for NASAL specimens only), is one component of a comprehensive MRSA colonization surveillance program. It is not intended to diagnose MRSA infection nor to guide or monitor treatment for MRSA infections.      Current facility-administered medications:  .  acetaminophen (TYLENOL) tablet 650 mg, 650 mg, Per Tube, Q6H PRN **OR** [DISCONTINUED] acetaminophen (TYLENOL) suppository 650 mg, 650 mg, Rectal, Q6H PRN, Henreitta Leber, MD .  Margrett Rud amiodarone (NEXTERONE) 1.8 mg/mL load via infusion 150 mg, 150 mg, Intravenous, Once, 150 mg at 02/01/15 0036 **FOLLOWED BY** [EXPIRED] amiodarone (NEXTERONE PREMIX) 360 MG/200ML (1.8 mg/mL) IV infusion, 60 mg/hr, Intravenous, Continuous, Stopped at 02/01/15 0526 **FOLLOWED BY** amiodarone (NEXTERONE PREMIX) 360 MG/200ML (1.8 mg/mL) IV infusion, 30 mg/hr, Intravenous, Continuous, Wilhelmina Mcardle, MD, Last Rate: 16.7 mL/hr at 02/01/15 0526, 30 mg/hr at 02/01/15 0526 .  antiseptic oral rinse solution (CORINZ), 7 mL, Mouth Rinse, QID, Lance Coon, MD .  chlorhexidine gluconate (PERIDEX) 0.12 % solution 15 mL, 15 mL, Mouth Rinse, BID, Lance Coon, MD .   docusate (COLACE) 50 MG/5ML liquid 100 mg, 100 mg, Per Tube, BID PRN, Wilhelmina Mcardle, MD .  enoxaparin (LOVENOX) injection 40 mg, 40 mg, Subcutaneous, Q24H, Wilhelmina Mcardle, MD, 40 mg at 02/01/15 0016 .  fentaNYL (SUBLIMAZE) injection 50 mcg, 50 mcg, Intravenous, Q15 min PRN, Wilhelmina Mcardle, MD .  fentaNYL (SUBLIMAZE) injection 50 mcg, 50 mcg, Intravenous, Q2H PRN, Wilhelmina Mcardle, MD .  levothyroxine (SYNTHROID, LEVOTHROID) tablet 125 mcg, 125 mcg, Per Tube, QAC breakfast, Wilhelmina Mcardle, MD .  metoprolol (LOPRESSOR) injection 2.5-5 mg, 2.5-5 mg, Intravenous, Q3H PRN, Wilhelmina Mcardle, MD .  midazolam (VERSED) injection 1 mg, 1 mg, Intravenous, Q15 min PRN, Wilhelmina Mcardle, MD .  midazolam (VERSED) injection 1 mg, 1 mg, Intravenous, Q2H PRN, Wilhelmina Mcardle, MD .  [DISCONTINUED] ondansetron (ZOFRAN) tablet 4 mg, 4 mg, Oral, Q6H PRN **OR** ondansetron (ZOFRAN) injection 4 mg, 4 mg, Intravenous, Q6H PRN, Henreitta Leber, MD .  pantoprazole sodium (PROTONIX) 40 mg/20 mL oral suspension 40 mg, 40 mg, Per Tube, Q1200, Wilhelmina Mcardle, MD .  phenylephrine (NEO-SYNEPHRINE) 40 mg in dextrose 5 % 250 mL (0.16 mg/mL) infusion, 0-200 mcg/min, Intravenous, Titrated, Wilhelmina Mcardle, MD, Stopped at 02/01/15 [phone] .  piperacillin-tazobactam (ZOSYN) IVPB 4.5 g, 4.5 g, Intravenous, 3 times per day, Henreitta Leber, MD, 4.5 g at 02/01/15 0526 .  propofol (DIPRIVAN) 1000 MG/100ML infusion, 0-50 mcg/kg/min, Intravenous, Continuous, Lance Coon, MD, Last Rate: 19.6 mL/hr at 02/01/15 0500, 29.997 mcg/kg/min at 02/01/15 0500 .  sertraline (ZOLOFT) tablet 50 mg, 50 mg, Per Tube, QHS, Wilhelmina Mcardle, MD .  sodium chloride 0.9 % injection 3 mL, 3 mL, Intravenous, Q12H, Henreitta Leber, MD, 3 mL at 02/01/15 0014 .  vancomycin (VANCOCIN) IVPB 1000 mg/200 mL premix, 1,000 mg, Intravenous, Q12H, Henreitta Leber, MD  IMAGING    Dg Abd 1 View  02/07/2015  CLINICAL DATA:  Endotracheal tube placement. EXAM: PORTABLE CHEST 1  VIEW COMPARISON:  CT scan 04/20/2014. FINDINGS: Chest x-ray: The endotracheal tube is 3 cm above the carina. The NG tube is coursing down the esophagus and into the  stomach. A left subclavian power port is noted. The tip is in the mid SVC. The heart is upper limits of normal in size. Moderate elevation of the right hemidiaphragm with overlying vascular crowding and atelectasis. No edema or definite infiltrates. No large effusions. Abdomen: The NG tube tip is in the antral region of the stomach. There is scattered air in the small bowel and colon but no definite findings for obstruction or perforation. A right hip prosthesis is noted. A reservoir for a penile prosthesis is noted on the left side. Advanced degenerative changes involving the lower lumbar spine. IMPRESSION: 1. The endotracheal tube and NG tubes are in good position. 2. No significant pulmonary findings. 3. No plain film findings for an acute abdominal process. Possible mild ileus. Electronically Signed   By: Marijo Sanes M.D.   On: 02/02/2015 22:49   Dg Chest Port 1 View  02/07/2015  CLINICAL DATA:  Endotracheal tube placement. EXAM: PORTABLE CHEST 1 VIEW COMPARISON:  CT scan 04/20/2014. FINDINGS: Chest x-ray: The endotracheal tube is 3 cm above the carina. The NG tube is coursing down the esophagus and into the stomach. A left subclavian power port is noted. The tip is in the mid SVC. The heart is upper limits of normal in size. Moderate elevation of the right hemidiaphragm with overlying vascular crowding and atelectasis. No edema or definite infiltrates. No large effusions. Abdomen: The NG tube tip is in the antral region of the stomach. There is scattered air in the small bowel and colon but no definite findings for obstruction or perforation. A right hip prosthesis is noted. A reservoir for a penile prosthesis is noted on the left side. Advanced degenerative changes involving the lower lumbar spine. IMPRESSION: 1. The endotracheal tube and NG  tubes are in good position. 2. No significant pulmonary findings. 3. No plain film findings for an acute abdominal process. Possible mild ileus. Electronically Signed   By: Marijo Sanes M.D.   On: 01/24/2015 22:49   Dg Chest Port 1 View  02/09/2015  CLINICAL DATA:  Shortness of breath and cough for 1 day EXAM: PORTABLE CHEST 1 VIEW COMPARISON:  Aug 14, 2014 FINDINGS: There is patchy infiltrate in the medial right base. The lungs elsewhere clear. Heart is upper normal in size with pulmonary vascularity within normal limits. No adenopathy. Port-A-Cath tip is in the superior vena cava. No pneumothorax. IMPRESSION: Patchy infiltrate medial right base. Lungs elsewhere clear. No change in cardiac silhouette. Electronically Signed   By: Lowella Grip III M.D.   On: 02/07/2015 17:44      Indwelling Urinary Catheter continued, requirement due to   Reason to continue Indwelling Urinary Catheter for strict Intake/Output monitoring for hemodynamic instability   Central Line continued, requirement due to   Reason to continue [employer] Monitoring of central venous pressure or other hemodynamic parameters   Ventilator continued, requirement due to, resp failure    Ventilator Sedation RASS 0 to -2   Cultures: BCx2 11/9>> UC  Sputum 11/9>>  Antibiotics: Vanc 11/9>> Zosyn 11/9>>  Lines:   ASSESSMENT/PLAN    PULMONARY OETT 11/9>> A:Hypoxic respiratory failure RLL/RML PNA  P:   - cont with MV, wean as tolerated - ? Hyperviscosity from CLL causing respiratory failure - patient significantly elevated WBC/lymphocytes - cont with antibiotics for presumptive PNA, vanc/zosyn - f\u sputum culture - wean vent as tolerated - maintain sats >88% -MAP >65 - ABG prn - elevated Cr, if improved, will consider CTA to r/o PE  CARDIOVASCULAR CVL A:  New onset atrial fibrillation Hypotension CAD  P:  - currently in NSR - check CEx3, EKG - will wean of amiodarone gtt, and start PO  meds - cardiology consult.   RENAL -ICU electrolyte replacement protocol - monitor I/O  GASTROINTESTINAL -GI prophylaxis  HEMATOLOGIC A:   CLL Hx of Colon Ca P:  -chemo meds on hold -significantly elevated lymphocytes while on chemo drug -heme\onc consult -monitor CBC  INFECTIOUS A:  RLL/RML pneumonia P:   - f\u cultures (sputum and blood) - cont with abx as stated above  ENDOCRINE A:  DM P:   -ssi  NEUROLOGIC RASS goal: -1 - cont with close monitoring    I have personally obtained a history, examined the patient, evaluated laboratory and imaging results, formulated the assessment and plan and placed orders.  The Patient requires high complexity decision making for assessment and support, frequent evaluation and titration of therapies, application of advanced monitoring technologies and extensive interpretation of multiple databases. Critical Care Time devoted to patient care services described in this note is 55 minutes.   Overall, patient is critically ill, prognosis is guarded. Patient at high risk for cardiac arrest and death.   Vilinda Boehringer, MD [facility] Pager [phone] (please enter 7-digits) On Call Pager [phone] (please enter 7-digits)     02/01/2015, 7:54 AM  Note: This note was prepared with Dragon dictation along with smaller phrase technology. Any transcriptional errors that result from this process are unintentional.

## 2015-02-01 NOTE — Progress Notes (Signed)
[facility] NAME: Dean Collins    MR#:  [id number]  DATE OF BIRTH:  1940-04-26  SUBJECTIVE:  CHIEF COMPLAINT:  Patient is intubated and sedated  REVIEW OF SYSTEMS:  Review of system unobtainable DRUG ALLERGIES:  No Known Allergies  VITALS:  Blood pressure 103/54, pulse 75, temperature 97.5 F (36.4 C), temperature source Axillary, resp. rate 21, height 5\' 10"  (1.778 m), weight 108.863 kg (240 lb), SpO2 92 %.  PHYSICAL EXAMINATION:  GENERAL:  74 y.o.-year-old patient lying in the bed with no acute distress.  EYES: Pupils equal, round, reactive to light and accommodation. No scleral icterus. HEENT: Head atraumatic, normocephalic. Oropharynx with ET tube NECK:  Supple, no jugular venous distention. No thyroid enlargement, no tenderness.  LUNGS: Moderate air entry with diffuse rhonchi.  No use of accessory muscles of respiration.  CARDIOVASCULAR: S1, S2 normal. No murmurs, rubs, or gallops.  ABDOMEN: Soft, nontender, nondistended. Bowel sounds present. No organomegaly or mass.  EXTREMITIES: No pedal edema, cyanosis, or clubbing.  NEUROLOGIC: Patient is sedated PSYCHIATRIC: The patient is sedated  SKIN: No obvious rash, lesion, or ulcer.    LABORATORY PANEL:   CBC  Recent Labs Lab 02/01/15 0434  WBC 216.4*  HGB 8.8*  HCT 28.0*  PLT 79*   ------------------------------------------------------------------------------------------------------------------  Chemistries   Recent Labs Lab 02/12/2015 1719 02/01/15 0434  NA 131* 136  K 4.0 3.4*  CL 100* 102  CO2 23 24  GLUCOSE 155* 176*  BUN 22* 25*  CREATININE 1.14 1.38*  CALCIUM 8.7* 8.2*  AST 21  --   ALT 13*  --   ALKPHOS 82  --   BILITOT 2.1*  --    ------------------------------------------------------------------------------------------------------------------  Cardiac Enzymes  Recent Labs Lab 02/08/2015 1719  TROPONINI 0.07*    ------------------------------------------------------------------------------------------------------------------  RADIOLOGY:  Dg Abd 1 View  02/01/2015  CLINICAL DATA:  Respiratory failure EXAM: ABDOMEN - 1 VIEW COMPARISON:  02/20/2015 FINDINGS: Normal bowel gas pattern. Negative for obstruction or ileus. NG tube in the stomach. No renal calculi Right hip replacement. Surgical clips in the pelvis from prior prostatectomy. Penile prosthesis reservoir in the left lower quadrant. IMPRESSION: No acute abnormality. Electronically Signed   By: Franchot Gallo M.D.   On: 02/01/2015 09:03   Dg Abd 1 View  02/21/2015  CLINICAL DATA:  Endotracheal tube placement. EXAM: PORTABLE CHEST 1 VIEW COMPARISON:  CT scan 04/20/2014. FINDINGS: Chest x-ray: The endotracheal tube is 3 cm above the carina. The NG tube is coursing down the esophagus and into the stomach. A left subclavian power port is noted. The tip is in the mid SVC. The heart is upper limits of normal in size. Moderate elevation of the right hemidiaphragm with overlying vascular crowding and atelectasis. No edema or definite infiltrates. No large effusions. Abdomen: The NG tube tip is in the antral region of the stomach. There is scattered air in the small bowel and colon but no definite findings for obstruction or perforation. A right hip prosthesis is noted. A reservoir for a penile prosthesis is noted on the left side. Advanced degenerative changes involving the lower lumbar spine. IMPRESSION: 1. The endotracheal tube and NG tubes are in good position. 2. No significant pulmonary findings. 3. No plain film findings for an acute abdominal process. Possible mild ileus. Electronically Signed   By: Marijo Sanes M.D.   On: 02/10/2015 22:49   Dg Chest Port 1 View  02/01/2015  CLINICAL DATA:  Shortness of  breath.  Respiratory failure . EXAM: PORTABLE CHEST 1 VIEW COMPARISON:  01/30/2015. FINDINGS: Endotracheal tube and NG tube stable position. Port-A-Cath  in stable position. Low lung volumes with mild basilar atelectasis again noted. Mild infiltrate right lung base cannot be excluded. Stable cardiomegaly. No pleural effusion or pneumothorax . IMPRESSION: 1. Lines and tubes in stable position. 2. Stable cardiomegaly. 3. Low lung volumes with stable bibasilar atelectasis. Mild infiltrate right lung base cannot be excluded . Electronically Signed   By: Marcello Moores  Register   On: 02/01/2015 08:26   Dg Chest Port 1 View  02/10/2015  CLINICAL DATA:  Endotracheal tube placement. EXAM: PORTABLE CHEST 1 VIEW COMPARISON:  CT scan 04/20/2014. FINDINGS: Chest x-ray: The endotracheal tube is 3 cm above the carina. The NG tube is coursing down the esophagus and into the stomach. A left subclavian power port is noted. The tip is in the mid SVC. The heart is upper limits of normal in size. Moderate elevation of the right hemidiaphragm with overlying vascular crowding and atelectasis. No edema or definite infiltrates. No large effusions. Abdomen: The NG tube tip is in the antral region of the stomach. There is scattered air in the small bowel and colon but no definite findings for obstruction or perforation. A right hip prosthesis is noted. A reservoir for a penile prosthesis is noted on the left side. Advanced degenerative changes involving the lower lumbar spine. IMPRESSION: 1. The endotracheal tube and NG tubes are in good position. 2. No significant pulmonary findings. 3. No plain film findings for an acute abdominal process. Possible mild ileus. Electronically Signed   By: Marijo Sanes M.D.   On: 02/15/2015 22:49   Dg Chest Port 1 View  02/16/2015  CLINICAL DATA:  Shortness of breath and cough for 1 day EXAM: PORTABLE CHEST 1 VIEW COMPARISON:  Aug 14, 2014 FINDINGS: There is patchy infiltrate in the medial right base. The lungs elsewhere clear. Heart is upper normal in size with pulmonary vascularity within normal limits. No adenopathy. Port-A-Cath tip is in the superior vena  cava. No pneumothorax. IMPRESSION: Patchy infiltrate medial right base. Lungs elsewhere clear. No change in cardiac silhouette. Electronically Signed   By: Lowella Grip III M.D.   On: 02/09/2015 17:44    EKG:   Orders placed or performed during the hospital encounter of 02/02/2015  . ED EKG 12-Lead  . ED EKG 12-Lead  . EKG 12-Lead  . EKG 12-Lead    ASSESSMENT AND PLAN:   74 year old male with past medical history of CLL, hypothyroidism, depression, hyperlipidemia, history of prostate cancer, who presented to the hospital due to shortness of breath, weakness, sputum production and noted to be in acute respiratory failure with hypoxia.  #1 acute respiratory failure with hypoxia- secondary to pneumonia. -Currently intubated, vent management per protocol. Follow-up with critical care -Since he is immunosuppressed given his underlying malignancy treated with broad-spectrum IV antibiotics with vancomycin, Zosyn.  -Follow blood and sputum cultures and follow him clinically.  #A. fib with RVR Well controlled with amiodarone Appreciate cardiology recommendations-we will follow up with Dr. Fletcher Anon  # pneumonia-likely the cause of patient's acute respiratory failure with hypoxia. -We'll treat with IV vancomycin, Zosyn. Follow blood and sputum cultures.  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 -pending oncology consult -continue to  hold his chemotherapy med given his acute infectious process.  #hypothyroidism-TSH is elevated .continue Synthroid.  # hyperlipidemia-continue simvastatin.  # depression, continue Zoloft.     All the records  are reviewed and case discussed with Care Management/Social Workerr. Management plans discussed with the patient's wife and she is  in agreement.  CODE STATUS: fc  TOTAL CRITICAL CARE TIME TAKING CARE OF THIS PATIENT: 35 minutes.   POSSIBLE D/C IN ? DAYS, DEPENDING ON CLINICAL CONDITION.   Nicholes Mango M.D on  02/01/2015 at 3:27 PM  Between 7am to 6pm - Pager - [phone] After 6pm go to [email or web address] - password [email or web address] [facility]  Office  [phone]  CC: Primary care physician; Idelle Crouch, MD

## 2015-02-01 NOTE — Consult Note (Signed)
CARDIOLOGY CONSULT NOTE   Patient ID: ZYHEIM CORDS MRN: [id number], DOB/AGE: 04/06/1940   Admit date: 02/13/2015 Date of Consult: 02/01/2015   Primary Physician: Idelle Crouch, MD Primary Cardiologist: new  Pt. Profile  74 y/o male w/o prior cardiac hx who was admitted 1/9 with RML PNA and VDRF and also found to be in AF with RVR.  Problem List  Past Medical History  Diagnosis Date  . Incontinence   . Arthritis   . CLL (chronic lymphocytic leukemia) ([place])     a. Dx 2003.  . Melanoma ([place])     scalp  . Diverticulitis   . History of prostate cancer   . Hypothyroidism   . Anxiety   . Right middle lobe pneumonia 02/01/2015    Past Surgical History  Procedure Laterality Date  . Replacement total knee      x2  . Prostatectomy  1993  . Colonoscopy  2013-2014?  Marland Kitchen Total hip arthroplasty Right 08/12/2014    Procedure: TOTAL HIP ARTHROPLASTY ANTERIOR APPROACH;  Surgeon: Hessie Knows, MD;  Location: [facility] ORS;  Service: Orthopedics;  Laterality: Right;  . Mohs surgery Right 10/2014    eyebrow     Allergies  No Known Allergies  HPI   74 y/o male without a prior cardiac history.  He does have a h/o CLL, prostate CA, malignant melanoma, and hypothyroidism.  He is also s/p R femoral neck fx and R THA in 07/2014.  He has been receiving physical therapy since and was doing well until recently when he began to note increasing right hip pain.  On 11/7, he c/o generalized malaise to his wife. He then developed aches and productive cough.  By the evening of 11/8, he was experiencing dyspnea as well.  He had an ortho appt on 11/9 and was noted to be markedly dyspneic.  He was escorted to the ED where CXR showed RML pna.  He was in AF.  He was admitted and placed on abx.  He had worsening respiratory distress required intubation late on 11/9.  In the setting of resp failure, he had both sinus tach and rapid AF.  He was placed on IV amio with intermittent AF overnight but has been in sinus  since ~ 4AM this morning.  He remains intubated and sedated on abx, propofol, and amio.  Pressures have been soft in the 80's to 90's.  Inpatient Medications  . antiseptic oral rinse  7 mL Mouth Rinse QID  . chlorhexidine gluconate  15 mL Mouth Rinse BID  . enoxaparin (LOVENOX) injection  40 mg Subcutaneous Q24H  . levothyroxine  125 mcg Per Tube QAC breakfast  . pantoprazole sodium  40 mg Per Tube Q1200  . piperacillin-tazobactam (ZOSYN)  IV  4.5 g Intravenous 3 times per day  . potassium chloride  40 mEq Oral Once  . sertraline  50 mg Per Tube QHS  . sodium chloride  3 mL Intravenous Q12H  . vancomycin  1,000 mg Intravenous Q12H    Family History Family History  Problem Relation Age of Onset  . Heart attack Mother   . Heart attack Father      Social History Social History   Social History  . Marital Status: Married    Spouse Name: N/A  . Number of Children: N/A  . Years of Education: N/A   Occupational History  . Not on file.   Social History Main Topics  . Smoking status: Former Smoker    Quit date:  03/24/1990  . Smokeless tobacco: Never Used  . Alcohol Use: No     Comment: occasionally  . Drug Use: No  . Sexual Activity: Yes    Birth Control/ Protection: None   Other Topics Concern  . Not on file   Social History Narrative     Review of Systems  Unable to obtain full ROS 2/2 intubated/sedated status.  Malaise, dyspnea, aches, and increasing right hip pain per wife.  Physical Exam  Blood pressure 93/57, pulse 70, temperature 97.6 F (36.4 C), temperature source Oral, resp. rate 19, height 5\' 10"  (1.778 m), weight 240 lb (108.863 kg), SpO2 99 %.  General: intubated/sedated. Psych: intubated/sedated. Neuro: intubated/sedated. HEENT: Normal  Neck: Supple without bruits or JVD. Lungs:  Resp regular and unlabored, CTA. Heart: RRR no s3, s4, or murmurs. Abdomen: Soft, non-tender, non-distended, BS + x 4.  Extremities: No clubbing, cyanosis or edema.  DP/PT/Radials 2+ and equal bilaterally.  Labs   Recent Labs  02/01/2015 1719  TROPONINI 0.07*   Lab Results  Component Value Date   WBC 216.4* 02/01/2015   HGB 8.8* 02/01/2015   HCT 28.0* 02/01/2015   MCV 95.0 02/01/2015   PLT 79* 02/01/2015    Recent Labs Lab 02/14/2015 1719 02/01/15 0434  NA 131* 136  K 4.0 3.4*  CL 100* 102  CO2 23 24  BUN 22* 25*  CREATININE 1.14 1.38*  CALCIUM 8.7* 8.2*  PROT 6.6  --   BILITOT 2.1*  --   ALKPHOS 82  --   ALT 13*  --   AST 21  --   GLUCOSE 155* 176*   Lab Results  Component Value Date   CHOL 157 01/14/2013   HDL 56 01/14/2013   LDLCALC 81 01/14/2013   TRIG 90 02/01/2015   Radiology/Studies  Dg Abd 1 View  02/03/2015  CLINICAL DATA:  Endotracheal tube placement. EXAM: PORTABLE CHEST 1 VIEW COMPARISON:  CT scan 04/20/2014. FINDINGS: Chest x-ray: The endotracheal tube is 3 cm above the carina. The NG tube is coursing down the esophagus and into the stomach. A left subclavian power port is noted. The tip is in the mid SVC. The heart is upper limits of normal in size. Moderate elevation of the right hemidiaphragm with overlying vascular crowding and atelectasis. No edema or definite infiltrates. No large effusions. Abdomen: The NG tube tip is in the antral region of the stomach. There is scattered air in the small bowel and colon but no definite findings for obstruction or perforation. A right hip prosthesis is noted. A reservoir for a penile prosthesis is noted on the left side. Advanced degenerative changes involving the lower lumbar spine. IMPRESSION: 1. The endotracheal tube and NG tubes are in good position. 2. No significant pulmonary findings. 3. No plain film findings for an acute abdominal process. Possible mild ileus. Electronically Signed   By: Marijo Sanes M.D.   On: 02/16/2015 22:49   Dg Chest Port 1 View  01/24/2015  CLINICAL DATA:  Endotracheal tube placement. EXAM: PORTABLE CHEST 1 VIEW COMPARISON:  CT scan 04/20/2014.  FINDINGS: Chest x-ray: The endotracheal tube is 3 cm above the carina. The NG tube is coursing down the esophagus and into the stomach. A left subclavian power port is noted. The tip is in the mid SVC. The heart is upper limits of normal in size. Moderate elevation of the right hemidiaphragm with overlying vascular crowding and atelectasis. No edema or definite infiltrates. No large effusions. Abdomen: The NG tube tip  is in the antral region of the stomach. There is scattered air in the small bowel and colon but no definite findings for obstruction or perforation. A right hip prosthesis is noted. A reservoir for a penile prosthesis is noted on the left side. Advanced degenerative changes involving the lower lumbar spine. IMPRESSION: 1. The endotracheal tube and NG tubes are in good position. 2. No significant pulmonary findings. 3. No plain film findings for an acute abdominal process. Possible mild ileus. Electronically Signed   By: Marijo Sanes M.D.   On: 02/18/2015 22:49   Dg Chest Port 1 View  02/20/2015  CLINICAL DATA:  Shortness of breath and cough for 1 day EXAM: PORTABLE CHEST 1 VIEW COMPARISON:  Aug 14, 2014 FINDINGS: There is patchy infiltrate in the medial right base. The lungs elsewhere clear. Heart is upper normal in size with pulmonary vascularity within normal limits. No adenopathy. Port-A-Cath tip is in the superior vena cava. No pneumothorax. IMPRESSION: Patchy infiltrate medial right base. Lungs elsewhere clear. No change in cardiac silhouette. Electronically Signed   By: Lowella Grip III M.D.   On: 02/18/2015 17:44    ECG  AF, RVR, 137, artifact.  ASSESSMENT AND PLAN  1.  AF RVR:  In setting of RML PNA with acute VDRF.  Currently in sinus on amio.  Pressures softening.  Would cont IV amio while intubated with plan to switch to oral bb vs oral amio once extubated and pressures stable.  Check echo. Supp K.  TSH elevated - on synthroid - may need adjustment. No prior h/o AF.   CHA2DS2VASc = 2 (age).  Will have to see what burden of AF once has recovered before making decision about [place].  He is currently anemic.  2.  RML PNA/Acute VDRF:  Abx and vent mgmt per CCM.  3.  Hypothyroidism:  With elevated TSH.  On synthroid @ home.  Further w/u / med adjustment per IM.  Signed, Murray Hodgkins, NP 02/01/2015, 8:22 AM

## 2015-02-01 NOTE — Consult Note (Signed)
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 Dean Collins 74 y.o.  male with a long-standing history of CLL was most recently re-started on on ibrutinib in September 2016 given the progression of disease- was in the orthopedic office for hip pain evaluation. However given the worsening shortness of breath noted he was referred to the emergency room.  Given the respiratory distress patient was initiated a BiPAP; and then intubated for further worsening respiratory distress. Patient's most recent- blood count shows a white count of 180-200s- with predominant lymphocytosis. However on day of admission his white count was up to 300,000. Hemoglobin around 8.8 platelets around 80s. Chest x-ray showed patchy infiltrate in the right medial base. Patient is currently on broad-spectrum antibiotics. Patient is currently on 40% FiO2.   ROS: Unable to assess.  MEDICAL HISTORY:  Past Medical History  Diagnosis Date  . Incontinence   . Arthritis   . CLL (chronic lymphocytic leukemia) ([place])     a. Dx 2003.  . Melanoma ([place])     scalp  . Diverticulitis   . History of prostate cancer   . Hypothyroidism   . Anxiety   . Right middle lobe pneumonia 02/01/2015    SURGICAL HISTORY: Past Surgical History   Procedure Laterality Date  . Replacement total knee      x2  . Prostatectomy  1993  . Colonoscopy  2013-2014?  Marland Kitchen Total hip arthroplasty Right 08/12/2014    Procedure: TOTAL HIP ARTHROPLASTY ANTERIOR APPROACH;  Surgeon: Hessie Knows, MD;  Location: [facility] ORS;  Service: Orthopedics;  Laterality: Right;  . Mohs surgery Right 10/2014    eyebrow    SOCIAL HISTORY: Social History   Social History  . Marital Status: Married    Spouse Name: N/A  . Number of Children: N/A  . Years of Education: N/A   Occupational History  . Not on file.   Social History Main Topics  . Smoking status: Former Smoker    Quit date: 03/24/1990  . Smokeless tobacco: Never Used  . Alcohol Use: 0.0 oz/week    0 Standard drinks or equivalent per week     Comment: occasionally/rare  . Drug Use: No  . Sexual Activity: Yes    Birth Control/ Protection: None   Other Topics Concern  . Not on file   Social History Narrative   Lives in [place] with his wife.  Does not routinely exercise but has been undergoing PT following right femoral neck fracture and right hip replacement in May.    FAMILY HISTORY: Family History  Problem Relation Age of Onset  . Heart attack Mother   . Heart attack Father     ALLERGIES:  has No Known Allergies.  MEDICATIONS:  Current Facility-Administered Medications  Medication Dose Route Frequency Provider Last Rate Last Dose  . acetaminophen (TYLENOL) tablet 650 mg  650 mg Per Tube Q6H PRN  Wilhelmina Mcardle, MD      . amiodarone (NEXTERONE PREMIX) 360 MG/200ML (1.8 mg/mL) IV infusion  30 mg/hr Intravenous Continuous Wilhelmina Mcardle, MD 16.7 mL/hr at 02/01/15 1402 30 mg/hr at 02/01/15 1402  . antiseptic oral rinse solution (CORINZ)  7 mL Mouth Rinse QID Lance Coon, MD   7 mL at 02/01/15 1128  . chlorhexidine gluconate (PERIDEX) 0.12 % solution 15 mL  15 mL Mouth Rinse BID Lance Coon, MD   15 mL at 02/01/15 0819  . docusate (COLACE) 50 MG/5ML liquid 100 mg  100 mg Per Tube BID PRN  Wilhelmina Mcardle, MD      . enoxaparin (LOVENOX) injection 40 mg  40 mg Subcutaneous Q24H Wilhelmina Mcardle, MD   40 mg at 02/01/15 0016  . feeding supplement (PRO-STAT SUGAR FREE 64) liquid 30 mL  30 mL Per Tube QID Vishal Mungal, MD   30 mL at 02/01/15 1410  . feeding supplement (VITAL HIGH PROTEIN) liquid 1,000 mL  1,000 mL Per Tube Q24H Vishal Mungal, MD   1,000 mL at 02/01/15 1403  . fentaNYL (SUBLIMAZE) injection 50 mcg  50 mcg Intravenous Q15 min PRN Wilhelmina Mcardle, MD      . fentaNYL (SUBLIMAZE) injection 50 mcg  50 mcg Intravenous Q2H PRN Wilhelmina Mcardle, MD      . free water 100 mL  100 mL Per Tube 3 times per day Vilinda Boehringer, MD   100 mL at 02/01/15 1413  . levothyroxine (SYNTHROID, LEVOTHROID) injection 62.5 mcg  62.5 mcg Intravenous Daily Vishal Mungal, MD   62.5 mcg at 02/01/15 1128  . metoprolol (LOPRESSOR) injection 2.5-5 mg  2.5-5 mg Intravenous Q3H PRN Wilhelmina Mcardle, MD      . midazolam (VERSED) injection 1 mg  1 mg Intravenous Q15 min PRN Wilhelmina Mcardle, MD      . midazolam (VERSED) injection 1 mg  1 mg Intravenous Q2H PRN Wilhelmina Mcardle, MD      . ondansetron [facility] injection 4 mg  4 mg Intravenous Q6H PRN Henreitta Leber, MD      . pantoprazole sodium (PROTONIX) 40 mg/20 mL oral suspension 40 mg  40 mg Per Tube Q1200 Wilhelmina Mcardle, MD   40 mg at 02/01/15 1128  . phenylephrine (NEO-SYNEPHRINE) 40 mg in dextrose 5 % 250 mL (0.16 mg/mL) infusion  0-200 mcg/min Intravenous Titrated Wilhelmina Mcardle, MD   Stopped at 02/01/15 [phone]  . piperacillin-tazobactam (ZOSYN) IVPB 4.5 g  4.5 g Intravenous 3 times per day Henreitta Leber, MD   4.5 g at 02/01/15 1300  . propofol (DIPRIVAN) 1000 MG/100ML infusion  0-50 mcg/kg/min Intravenous Continuous Lance Coon, MD 16.3 mL/hr at 02/01/15 1252 25 mcg/kg/min at 02/01/15 1252  . sertraline (ZOLOFT) tablet 50 mg  50 mg Per Tube QHS Wilhelmina Mcardle, MD      . sodium chloride 0.9 % injection 3 mL  3 mL Intravenous Q12H Henreitta Leber, MD   3  mL at 02/01/15 0014  . vancomycin (VANCOCIN) IVPB 1000 mg/200 mL premix  1,000 mg Intravenous Q12H Henreitta Leber, MD   1,000 mg at 02/01/15 0814      .  PHYSICAL EXAMINATION:  Filed Vitals:   02/01/15 1200  BP: 92/59  Pulse: 71  Temp: 97.5 F (36.4 C)  Resp: 21   Filed Weights   02/09/2015 1644  Weight: 240 lb (108.863 kg)    GENERAL: Well-nourished well-developed; sedated and intubated.  Patient appears restless. EYES: Positive for pallor no icterus. NECK: supple, no masses felt LYMPH:  no palpable lymphadenopathy in the cervical, axillary or inguinal regions LUNGS: Decreased air entry bilaterally. HEART/CVS: regular rate & rhythm and no murmurs; bilateral 1+ edema. ABDOMEN: abdomen soft, non-tender and normal bowel sounds Musculoskeletal:no cyanosis of digits and no clubbing  NEURO: no focal motor/sensory deficits SKIN:  no rashes or significant lesions  LABORATORY DATA:  I have reviewed the data as listed Lab Results  Component Value Date   WBC 216.4* 02/01/2015   HGB 8.8* 02/01/2015   HCT 28.0* 02/01/2015   MCV 95.0 02/01/2015   PLT 79* 02/01/2015    Recent Labs  11/17/14 1052  12/08/14 1014 12/29/14 1117 02/12/2015 1719 02/01/15 0434  NA 140  < > 137 135 131* 136  K 4.7  < > 4.0 5.0 4.0 3.4*  CL 105  < > 106 104 100* 102  CO2 27  < > 26 26 23 24   GLUCOSE 121*  < > 118* 153* 155* 176*  BUN 22*  < > 21* 30* 22* 25*  CREATININE 1.10  < > 1.06 1.05 1.14 1.38*  CALCIUM 8.8*  < > 8.7* 8.6* 8.7* 8.2*  GFRNONAA >60  < > >60 >60 >60 48*  GFRAA >60  < > >60 >60 >60 56*  PROT 6.1*  --  6.2* 6.7 6.6  --   ALBUMIN 4.1  --  4.1 4.2 3.9  --   AST 19  --  14* 20 21  --   ALT 15*  --  14* 24 13*  --   ALKPHOS 80  --  80 93 82  --   BILITOT 0.9  --  0.7 0.9 2.1*  --   BILIDIR <0.1*  --  0.1 0.2  --   --   IBILI NOT CALCULATED  --  0.6 0.7  --   --   < > = values in this interval not displayed.  RADIOGRAPHIC STUDIES: I have personally reviewed the radiological  images as listed and agreed with the findings in the report. Dg Abd 1 View  02/01/2015  CLINICAL DATA:  Respiratory failure EXAM: ABDOMEN - 1 VIEW COMPARISON:  02/09/2015 FINDINGS: Normal bowel gas pattern. Negative for obstruction or ileus. NG tube in the stomach. No renal calculi Right hip replacement. Surgical clips in the pelvis from prior prostatectomy. Penile prosthesis reservoir in the left lower quadrant. IMPRESSION: No acute abnormality. Electronically Signed   By: Franchot Gallo M.D.   On: 02/01/2015 09:03   Dg Abd 1 View  02/10/2015  CLINICAL DATA:  Endotracheal tube placement. EXAM: PORTABLE CHEST 1 VIEW COMPARISON:  CT scan 04/20/2014. FINDINGS: Chest x-ray: The endotracheal tube is 3 cm above the carina. The NG tube is coursing down the esophagus and into the stomach. A left subclavian power port is noted. The tip is in the mid SVC. The heart is upper limits of normal in size. Moderate elevation of the right hemidiaphragm with overlying vascular crowding and atelectasis. No edema or definite infiltrates. No large effusions. Abdomen: The NG tube tip is in the antral region of the stomach. There is scattered air in the small bowel and colon but no definite findings for obstruction or perforation. A right hip prosthesis is noted. A reservoir for a penile prosthesis is noted on the left side. Advanced degenerative changes involving the lower lumbar spine. IMPRESSION: 1. The endotracheal tube and NG tubes are in good position. 2. No significant  pulmonary findings. 3. No plain film findings for an acute abdominal process. Possible mild ileus. Electronically Signed   By: Marijo Sanes M.D.   On: 02/04/2015 22:49   Dg Chest Port 1 View  02/01/2015  CLINICAL DATA:  Shortness of breath.  Respiratory failure . EXAM: PORTABLE CHEST 1 VIEW COMPARISON:  01/25/2015. FINDINGS: Endotracheal tube and NG tube stable position. Port-A-Cath in stable position. Low lung volumes with mild basilar atelectasis again  noted. Mild infiltrate right lung base cannot be excluded. Stable cardiomegaly. No pleural effusion or pneumothorax . IMPRESSION: 1. Lines and tubes in stable position. 2. Stable cardiomegaly. 3. Low lung volumes with stable bibasilar atelectasis. Mild infiltrate right lung base cannot be excluded . Electronically Signed   By: Marcello Moores  Register   On: 02/01/2015 08:26   Dg Chest Port 1 View  02/09/2015  CLINICAL DATA:  Endotracheal tube placement. EXAM: PORTABLE CHEST 1 VIEW COMPARISON:  CT scan 04/20/2014. FINDINGS: Chest x-ray: The endotracheal tube is 3 cm above the carina. The NG tube is coursing down the esophagus and into the stomach. A left subclavian power port is noted. The tip is in the mid SVC. The heart is upper limits of normal in size. Moderate elevation of the right hemidiaphragm with overlying vascular crowding and atelectasis. No edema or definite infiltrates. No large effusions. Abdomen: The NG tube tip is in the antral region of the stomach. There is scattered air in the small bowel and colon but no definite findings for obstruction or perforation. A right hip prosthesis is noted. A reservoir for a penile prosthesis is noted on the left side. Advanced degenerative changes involving the lower lumbar spine. IMPRESSION: 1. The endotracheal tube and NG tubes are in good position. 2. No significant pulmonary findings. 3. No plain film findings for an acute abdominal process. Possible mild ileus. Electronically Signed   By: Marijo Sanes M.D.   On: 02/12/2015 22:49   Dg Chest Port 1 View  02/19/2015  CLINICAL DATA:  Shortness of breath and cough for 1 day EXAM: PORTABLE CHEST 1 VIEW COMPARISON:  Aug 14, 2014 FINDINGS: There is patchy infiltrate in the medial right base. The lungs elsewhere clear. Heart is upper normal in size with pulmonary vascularity within normal limits. No adenopathy. Port-A-Cath tip is in the superior vena cava. No pneumothorax. IMPRESSION: Patchy infiltrate medial right base.  Lungs elsewhere clear. No change in cardiac silhouette. Electronically Signed   By: Lowella Grip III M.D.   On: 02/21/2015 17:44    ASSESSMENT & PLAN:   74 year old Caucasian male patient with a history of CLL most recently relapsed in September 2016 on second line therapy with ibrutinib currently admitted to the hospital for respiratory distress/respiratory failure.  #CLL- hyperleukocytosis/predominant lymphocytosis- recent worsening [170s to 200s] noted secondary to ibrutinib. Also the white count has come down from 300-to 200,000 today.  # Respiratory failure-unclear etiology; less likely from hyperleukocytosis/from CLL  # A. Fib- rate controlled.  Recommendations:  # Clinically, is very unlikely for hyperleukocytosis from elevated lymphocytosis from CLL would cause respiratory distress/respiratory failure. Unfortunately Hydrea cannot be crushed as per pharmacy. I will send off peripheral blood flow cytometry to rule out any blastic transformation.  # The above plan was discussed with the patient's nurse; also spoke to [place].    The total time spent in the appointment was 60 minutes and more than 50% was on counseling.     Cammie Sickle, MD 02/01/2015 2:24 PM

## 2015-02-01 NOTE — Telephone Encounter (Signed)
Patient in ICU-10. Dr. Rogue Bussing has been consulted. MD made aware. 02/01/15 at 930

## 2015-02-01 NOTE — Progress Notes (Signed)
Initial Nutrition Assessment  INTERVENTION:   EN: received verbal order from MD Mungal to start TF; recommend Vital High Protein at rate of 20 ml/hr with initial goal rate of 30 ml/hr with Prostat TID as pt receiving additional kcals from diprivan. Provides 1100 kcals, 124 g of protein. Continue to assess   NUTRITION DIAGNOSIS:   Inadequate oral intake related to acute illness as evidenced by NPO status.  GOAL:   Provide needs based on ASPEN/SCCM guidelines  MONITOR:    (Energy Intake, Anthropometrics, Digestive System, Pulmonary, Electrolyte/Renal Profile)  REASON FOR ASSESSMENT:   Consult, Ventilator Enteral/tube feeding initiation and management  ASSESSMENT:    Pt admitted with acute respiratory failure with pneumonia requiring intubation; pt with hx of CLL, on chemotherapy prior to admission  Past Medical History  Diagnosis Date  . Incontinence   . Arthritis   . CLL (chronic lymphocytic leukemia) ([place])     a. Dx 2003.  . Melanoma ([place])     scalp  . Diverticulitis   . History of prostate cancer   . Hypothyroidism   . Anxiety   . Right middle lobe pneumonia 02/01/2015    Diet Order:   NPO  Skin:  Reviewed, no issues   Digestive System: OG tube, abdomen soft, obese  Electrolyte and Renal Profile:  Recent Labs Lab 02/07/2015 1719 02/01/15 0434  BUN 22* 25*  CREATININE 1.14 1.38*  NA 131* 136  K 4.0 3.4*   Glucose Profile:   Recent Labs  01/30/2015 2107  GLUCAP 159*   Nutritional Anemia Profile:  CBC Latest Ref Rng 02/01/2015 02/04/2015 01/26/2015  WBC 3.8 - 10.6 K/uL 216.4(HH) 303.3(HH) 212.6(HH)  Hemoglobin 13.0 - 18.0 g/dL 8.8(L) 11.0(L) 11.3(L)  Hematocrit 40.0 - 52.0 % 28.0(L) 35.1(L) 35.7(L)  Platelets 150 - 440 K/uL 79(L) 107(L) 108(L)   Lipid Profile:     Component Value Date/Time   CHOL 157 01/14/2013 1018   TRIG 90 02/01/2015 0149   TRIG 102 01/14/2013 1018   HDL 56 01/14/2013 1018   VLDL 20 01/14/2013 1018   LDLCALC 81 01/14/2013  1018    Protein Profile:   Recent Labs Lab 02/20/2015 1719  ALBUMIN 3.9   Meds: diprivan (current rate of 16.7 ml/hr provides 441 kacls in 24 hours)  Nutrition Focused Physical Exam:  Unable to complete Nutrition-Focused physical exam at this time.    Height:   Ht Readings from Last 1 Encounters:  01/28/2015 5\' 10"  (1.778 m)    Weight:   Wt Readings from Last 1 Encounters:  02/07/2015 240 lb (108.863 kg)   Wt Readings from Last 10 Encounters:  02/10/2015 240 lb (108.863 kg)  01/12/15 238 lb 12.1 oz (108.3 kg)  12/29/14 233 lb 11 oz (106 kg)  12/21/14 242 lb 8.1 oz (110 kg)  12/01/14 237 lb 10.5 oz (107.8 kg)  11/03/14 238 lb 15.7 oz (108.4 kg)  08/11/14 234 lb (106.142 kg)  01/25/14 134 lb (60.782 kg)  11/10/13 221 lb (100.245 kg)    BMI:  Body mass index is 34.44 kg/(m^2).  Estimated Nutritional Needs:   Kcal:  [id number] kcals (11-14 kcals/kg)   Protein:  150 g (2.0 g/kg) using IBW 75 kg  Fluid:  1875-2250 mL (25-30 ml/kg)   HIGH Care Level  Kerman Passey MS, RD, LDN [phone] Pager

## 2015-02-01 NOTE — Progress Notes (Signed)
Report given to Di Kindle, RN pt remains intubated FiO2 at 40% with O2 sats mid 90's; pt follows commands when sedation is delayed; vss; adequate uop via foley; no s/s of pain; NSR on cardiac monitor; pts wife updated about plan of care and questions answered will continue to monitor and assess pt

## 2015-02-01 NOTE — Progress Notes (Signed)
Pharmacy Consult for Vancomycin/Zosyn Indication: pneumonia  No Known Allergies  Patient Measurements: Height: 5\' 10"  (177.8 cm) Weight: 240 lb (108.863 kg) IBW/kg (Calculated) : 73 Adjusted Body Weight:   Vital Signs: Temp: 97.5 F (36.4 C) (11/10 1200) Temp Source: Axillary (11/10 1200) BP: 92/59 mmHg (11/10 1200) Pulse Rate: 71 (11/10 1200) Intake/Output from previous day: 11/09 0701 - 11/10 0700 In: 649.7 [I.V.:449.7; IV Piggyback:200] Out: 150 [Urine:150] Intake/Output from this shift: Total I/O In: 66.8 [I.V.:66.8] Out: 120 [Urine:120]  Labs:  Recent Labs  01/27/2015 1719 02/01/15 0434  WBC 303.3* 216.4*  HGB 11.0* 8.8*  PLT 107* 79*  CREATININE 1.14 1.38*   Estimated Creatinine Clearance: 57.2 mL/min (by C-G formula based on Cr of 1.38). No results for input(s): VANCOTROUGH, VANCOPEAK, VANCORANDOM, GENTTROUGH, GENTPEAK, GENTRANDOM, TOBRATROUGH, TOBRAPEAK, TOBRARND, AMIKACINPEAK, AMIKACINTROU, AMIKACIN in the last 72 hours.   Microbiology: Recent Results (from the past 720 hour(s))  Culture, sputum-assessment     Status: None   Collection Time: 02/10/2015  5:20 PM  Result Value Ref Range Status   Specimen Description SPUTUM  Final   Special Requests Immunocompromised  Final   Sputum evaluation THIS SPECIMEN IS ACCEPTABLE FOR SPUTUM CULTURE  Final   Report Status 01/26/2015 FINAL  Final  Culture, respiratory (NON-Expectorated)     Status: None (Preliminary result)   Collection Time: 02/10/2015  5:20 PM  Result Value Ref Range Status   Specimen Description SPUTUM  Final   Special Requests Immunocompromised Reflexed from [id number]  Final   Gram Stain PENDING  Incomplete   Culture   Final    HEAVY GROWTH GROUP A STREP (S.PYOGENES) ISOLATED There is no known Penicillin Resistant Beta Streptococcus in the [place] For patients that are Penicillin-allergic, Erythromycin is 85-94% susceptible, and Clindamycin is 80% susceptible.  Contact Microbiology within 7 days if  sensitivity testing is  required.      Report Status PENDING  Incomplete  MRSA PCR Screening     Status: None   Collection Time: 01/28/2015 10:47 PM  Result Value Ref Range Status   MRSA by PCR NEGATIVE NEGATIVE Final    Comment:        The GeneXpert MRSA Assay (FDA approved for NASAL specimens only), is one component of a comprehensive MRSA colonization surveillance program. It is not intended to diagnose MRSA infection nor to guide or monitor treatment for MRSA infections.   Culture, respiratory (NON-Expectorated)     Status: None (Preliminary result)   Collection Time: 02/01/15  1:40 AM  Result Value Ref Range Status   Specimen Description TRACHEAL ASPIRATE  Final   Special Requests NONE  Final   Gram Stain PENDING  Incomplete   Culture NO GROWTH < 12 HOURS  Final   Report Status PENDING  Incomplete    Medical History: Past Medical History  Diagnosis Date  . Incontinence   . Arthritis   . CLL (chronic lymphocytic leukemia) ([place])     a. Dx 2003.  . Melanoma ([place])     scalp  . Diverticulitis   . History of prostate cancer   . Hypothyroidism   . Anxiety   . Right middle lobe pneumonia 02/01/2015    Medications:  Prescriptions prior to admission  Medication Sig Dispense Refill Last Dose  . cetirizine (ZYRTEC) 10 MG tablet Take 10 mg by mouth daily.   02/07/2015 at Unknown time  . Cholecalciferol (VITAMIN D3) 2000 UNITS capsule Take by mouth.   01/30/2015 at Unknown time  . fluticasone (FLONASE) 50 MCG/ACT  nasal spray SPRAY TWICE IN EACH NOSTRIL ONCE DAILY  12 01/30/2015 at Unknown time  . furosemide (LASIX) 20 MG tablet Take by mouth.   prn at prn  . HYDROcodone-acetaminophen (NORCO/VICODIN) 5-325 MG tablet Take 1 tablet by mouth every 6 (six) hours as needed for moderate pain or severe pain.    01/30/2015 at Unknown time  . ibrutinib (IMBRUVICA) 140 MG capsul Take 2 capsules (280 mg total) by mouth daily. (Patient taking differently: Take 420 mg by mouth daily. ) 60  capsule 3 01/28/2015 at Unknown time  . levothyroxine (SYNTHROID, LEVOTHROID) 125 MCG tablet Take 125 mcg by mouth daily.   01/30/2015 at Unknown time  . levothyroxine (SYNTHROID, LEVOTHROID) 75 MCG tablet Take 75 mcg by mouth daily before breakfast.   01/30/2015 at Unknown time  . lidocaine-prilocaine (EMLA) cream Apply cream 1 hour before chemotherapy treatment 30 g 1 Past Month at Unknown time  . methocarbamol (ROBAXIN) 500 MG tablet Take 500 mg by mouth 2 (two) times daily.  0 01/30/2015 at Unknown time  . potassium chloride (MICRO-K) 10 MEQ CR capsule Take by mouth.   prn at prn  . sertraline (ZOLOFT) 50 MG tablet Take 50 mg by mouth at bedtime.    01/30/2015 at Unknown time  . simvastatin (ZOCOR) 40 MG tablet Take 40 mg by mouth daily at 6 PM.    01/30/2015 at Unknown time  . zolpidem (AMBIEN) 5 MG tablet Take 5 mg by mouth at bedtime.   01/30/2015 at Unknown time   Assessment: 74 y/o M with CLL admitted with respiratory failure secondary to pneumonia.   Goal of Therapy:  Vancomycin trough level 15-20 mcg/ml  Plan:  Expected duration 7 days with resolution of temperature and/or normalization of WBC   Will continue  Zosyn 4.5 g EI q 8 hours.   Will continue vancomycin 1000 mg iv q 12 hours. Trough scheduled 11/11 at 1830. Will continue to follow renal function and culture results.   Ulice Dash D 02/01/2015,1:13 PM

## 2015-02-02 ENCOUNTER — Inpatient Hospital Stay (HOSPITAL_COMMUNITY)
Admit: 2015-02-02 | Discharge: 2015-02-02 | Disposition: A | Discharge status: Home / Self Care | Payer: Medicare Other | Attending: Cardiovascular Disease | Admitting: Cardiovascular Disease

## 2015-02-02 ENCOUNTER — Inpatient Hospital Stay: Payer: Medicare Other

## 2015-02-02 ENCOUNTER — Inpatient Hospital Stay: Payer: Medicare Other | Admitting: Internal Medicine

## 2015-02-02 DIAGNOSIS — I4891 Unspecified atrial fibrillation: Secondary | ICD-10-CM

## 2015-02-02 LAB — CULTURE, RESPIRATORY

## 2015-02-02 LAB — BLOOD GAS, ARTERIAL
Acid-Base Excess: 1.7 mmol/L (ref 0.0–3.0)
Bicarbonate: 26.6 mEq/L (ref 21.0–28.0)
FIO2: 0.4
MECHANICAL RATE: 15
O2 Saturation: 93.5 %
PATIENT TEMPERATURE: 37
PEEP/CPAP: 5 cmH2O
PO2 ART: 68 mmHg — AB (ref 83.0–108.0)
VT: 580 mL
pCO2 arterial: 42 mmHg (ref 32.0–48.0)
pH, Arterial: 7.41 (ref 7.350–7.450)

## 2015-02-02 LAB — GLUCOSE, CAPILLARY
GLUCOSE-CAPILLARY: 101 mg/dL — AB (ref 65–99)
Glucose-Capillary: 135 mg/dL — ABNORMAL HIGH (ref 65–99)
Glucose-Capillary: 116 mg/dL — ABNORMAL HIGH (ref 65–99)

## 2015-02-02 LAB — CBC
HCT: 31.1 % — ABNORMAL LOW (ref 40.0–52.0)
Hemoglobin: 9.2 g/dL — ABNORMAL LOW (ref 13.0–18.0)
MCH: 29 pg (ref 26.0–34.0)
MCHC: 29.7 g/dL — AB (ref 32.0–36.0)
MCV: 97.7 fL (ref 80.0–100.0)
PLATELETS: 77 10*3/uL — AB (ref 150–440)
RBC: 3.18 MIL/uL — AB (ref 4.40–5.90)
RDW: 15.9 % — AB (ref 11.5–14.5)
WBC: 176 10*3/uL (ref 3.8–10.6)

## 2015-02-02 LAB — CULTURE, RESPIRATORY W GRAM STAIN

## 2015-02-02 MED ORDER — LORAZEPAM 2 MG/ML IJ SOLN
0.5000 mg | INTRAMUSCULAR | Status: DC | PRN
Start: 2015-02-02 — End: 2015-02-11
  Administered 2015-02-02 – 2015-02-03 (×2): 0.5 mg via INTRAVENOUS
  Administered 2015-02-03 – 2015-02-04 (×2): 1 mg via INTRAVENOUS
  Filled 2015-02-02 (×3): qty 1

## 2015-02-02 MED ORDER — DEXTROSE 5 % IV SOLN
2.0000 g | INTRAVENOUS | Status: DC
  Administered 2015-02-02 – 2015-02-08 (×7): 2 g via INTRAVENOUS
  Filled 2015-02-02 (×9): qty 2

## 2015-02-02 MED ORDER — MORPHINE SULFATE (PF) 2 MG/ML IV SOLN
1.0000 mg | INTRAVENOUS | Status: DC | PRN
  Administered 2015-02-02 – 2015-02-04 (×3): 2 mg via INTRAVENOUS
  Filled 2015-02-02 (×3): qty 1

## 2015-02-02 MED ORDER — LORAZEPAM 2 MG/ML IJ SOLN
0.5000 mg | Freq: Three times a day (TID) | INTRAMUSCULAR | Status: DC | PRN
  Administered 2015-02-02: 0.5 mg via INTRAVENOUS
  Filled 2015-02-02 (×2): qty 1

## 2015-02-02 NOTE — Progress Notes (Signed)
Patient placed in SBT at 0903, very anxious but tolerating SBT well.  Patient suctioned prior to extubation.  Patient extubated without complication and placed on HFNC 45% at 45LPM, patient tolerating well.  Will continue to monitor

## 2015-02-02 NOTE — Progress Notes (Signed)
Dean Collins   DOB:April 22, 1940   [id number]    CC: CLL hyperleukocytosis/respiratory failure  Subjective: Overnight uneventful. Patient passes his SBT this morning; awaiting extubation.   Review of systems: Cannot be obtained  Objective:  Filed Vitals:   02/02/15 0700  BP: 98/58  Pulse: 73  Temp:   Resp: 18     Intake/Output Summary (Last 24 hours) at 02/02/15 0824 Last data filed at 02/02/15 [id number]  Gross per 24 hour  Intake 1093.2 ml  Output   1765 ml  Net -671.8 ml    GENERAL: Well-nourished well-developed; intubated. Patient appears restless. EYES: Positive for pallor no icterus. NECK: supple, no masses felt LYMPH: no palpable lymphadenopathy in the cervical, axillary or inguinal regions LUNGS: Decreased air entry bilaterally. HEART/CVS: regular rate & rhythm and no murmurs; bilateral 1+ edema. ABDOMEN: abdomen soft, non-tender and normal bowel sounds Musculoskeletal:no cyanosis of digits and no clubbing  NEURO: no focal motor/sensory deficits SKIN: no rashes or significant lesions  Labs:  Lab Results  Component Value Date   WBC 176.0* 02/02/2015   HGB 9.2* 02/02/2015   HCT 31.1* 02/02/2015   MCV 97.7 02/02/2015   PLT 77* 02/02/2015   NEUTROABS 41.9* 02/19/2015    Lab Results  Component Value Date   NA 136 02/01/2015   K 3.4* 02/01/2015   CL 102 02/01/2015   CO2 24 02/01/2015    Studies:  Dg Abd 1 View  02/01/2015  CLINICAL DATA:  Respiratory failure EXAM: ABDOMEN - 1 VIEW COMPARISON:  02/10/2015 FINDINGS: Normal bowel gas pattern. Negative for obstruction or ileus. NG tube in the stomach. No renal calculi Right hip replacement. Surgical clips in the pelvis from prior prostatectomy. Penile prosthesis reservoir in the left lower quadrant. IMPRESSION: No acute abnormality. Electronically Signed   By: Franchot Gallo M.D.   On: 02/01/2015 09:03   Dg Abd 1 View  02/21/2015  CLINICAL DATA:  Endotracheal tube placement. EXAM: PORTABLE CHEST 1 VIEW  COMPARISON:  CT scan 04/20/2014. FINDINGS: Chest x-ray: The endotracheal tube is 3 cm above the carina. The NG tube is coursing down the esophagus and into the stomach. A left subclavian power port is noted. The tip is in the mid SVC. The heart is upper limits of normal in size. Moderate elevation of the right hemidiaphragm with overlying vascular crowding and atelectasis. No edema or definite infiltrates. No large effusions. Abdomen: The NG tube tip is in the antral region of the stomach. There is scattered air in the small bowel and colon but no definite findings for obstruction or perforation. A right hip prosthesis is noted. A reservoir for a penile prosthesis is noted on the left side. Advanced degenerative changes involving the lower lumbar spine. IMPRESSION: 1. The endotracheal tube and NG tubes are in good position. 2. No significant pulmonary findings. 3. No plain film findings for an acute abdominal process. Possible mild ileus. Electronically Signed   By: Marijo Sanes M.D.   On: 01/23/2015 22:49   Dg Chest Port 1 View  02/02/2015  CLINICAL DATA:  Respiratory failure. EXAM: PORTABLE CHEST 1 VIEW COMPARISON:  02/01/2015. FINDINGS: Endotracheal tube, Port-A-Cath, NG tube in stable position. Stable cardiomegaly. Low lung volumes with bibasilar atelectasis. Mild infiltrate in the right lung base again cannot be excluded. No pleural effusion or pneumothorax . IMPRESSION: 1. Lines and tubes in stable position. 2.  Stable cardiomegaly. 3. Low lung volumes with stable bibasilar atelectasis. Mild infiltrate right lung base again cannot be excluded. No interim  change. Electronically Signed   By: Parrott   On: 02/02/2015 07:07   Dg Chest Port 1 View  02/01/2015  CLINICAL DATA:  Shortness of breath.  Respiratory failure . EXAM: PORTABLE CHEST 1 VIEW COMPARISON:  02/01/2015. FINDINGS: Endotracheal tube and NG tube stable position. Port-A-Cath in stable position. Low lung volumes with mild basilar  atelectasis again noted. Mild infiltrate right lung base cannot be excluded. Stable cardiomegaly. No pleural effusion or pneumothorax . IMPRESSION: 1. Lines and tubes in stable position. 2. Stable cardiomegaly. 3. Low lung volumes with stable bibasilar atelectasis. Mild infiltrate right lung base cannot be excluded . Electronically Signed   By: Marcello Moores  Register   On: 02/01/2015 08:26   Dg Chest Port 1 View  02/10/2015  CLINICAL DATA:  Endotracheal tube placement. EXAM: PORTABLE CHEST 1 VIEW COMPARISON:  CT scan 04/20/2014. FINDINGS: Chest x-ray: The endotracheal tube is 3 cm above the carina. The NG tube is coursing down the esophagus and into the stomach. A left subclavian power port is noted. The tip is in the mid SVC. The heart is upper limits of normal in size. Moderate elevation of the right hemidiaphragm with overlying vascular crowding and atelectasis. No edema or definite infiltrates. No large effusions. Abdomen: The NG tube tip is in the antral region of the stomach. There is scattered air in the small bowel and colon but no definite findings for obstruction or perforation. A right hip prosthesis is noted. A reservoir for a penile prosthesis is noted on the left side. Advanced degenerative changes involving the lower lumbar spine. IMPRESSION: 1. The endotracheal tube and NG tubes are in good position. 2. No significant pulmonary findings. 3. No plain film findings for an acute abdominal process. Possible mild ileus. Electronically Signed   By: Marijo Sanes M.D.   On: 02/20/2015 22:49   Dg Chest Port 1 View  02/10/2015  CLINICAL DATA:  Shortness of breath and cough for 1 day EXAM: PORTABLE CHEST 1 VIEW COMPARISON:  Aug 14, 2014 FINDINGS: There is patchy infiltrate in the medial right base. The lungs elsewhere clear. Heart is upper normal in size with pulmonary vascularity within normal limits. No adenopathy. Port-A-Cath tip is in the superior vena cava. No pneumothorax. IMPRESSION: Patchy infiltrate  medial right base. Lungs elsewhere clear. No change in cardiac silhouette. Electronically Signed   By: Lowella Grip III M.D.   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  #CLL- hyperleukocytosis/predominant lymphocytosis- recent worsening [170s to 200s] noted secondary to ibrutinib; peaked at 300. Today white count is 176/hemoglobin stable around 9/thrombocytopenia again stable - 77  # Respiratory failure/status-unclear etiology; less likely from hyperleukocytosis/from CLL-improving  # A. Fib- currently in sinus rhythm/rate controlled on amiodarone.  Recommendations:  # Continue to hold ibrutinib. No new recommendations from hematology/oncology standpoint. Hold off Hydrea at this time as the counts are improving since holding ibrutinib. We will monitor the counts.   Cammie Sickle, MD 02/02/2015  8:24 AM

## 2015-02-02 NOTE — Progress Notes (Signed)
*  PRELIMINARY RESULTS* Echocardiogram 2D Echocardiogram has been performed.  Dean Collins 02/02/2015, 2:42 PM

## 2015-02-02 NOTE — Consult Note (Addendum)
PULMONARY / CRITICAL CARE MEDICINE   Name: Dean Collins MRN: [id number] DOB: 10-23-1940    ADMISSION DATE:  02/08/2015 CONSULTATION DATE:  02/01/15  REFERRING MD :  Dr. Verdell Carmine   CHIEF COMPLAINT:     Sob/respiratory failure   HISTORY OF PRESENT ILLNESS per chart review  74 y.o. male with a known history of CLL, prostate cancer, hypothyroidism, anxiety, history of diverticulitis, osteoarthritis, upper thyroidism, hyperlipidemia who presents to the hospital due to shortness of breath and cough. Patient apparently has been having some right hip pain from his previous history of hip replacement and saw his orthopedic surgeon for follow-up today and during his evaluation he noticed the patient was quite short of breath and therefore sent him to the ER for further evaluation. As per the wife patient did not sleep well last night and has been having gurgling sounds and feeling short of breath now since last night. He admits to a cough which is productive of green-yellow sputum, with chills but no documented fever. He denies any chest pain, nausea, abdominal pain, diarrhea or any other associated symptoms. He presented to the hospital and was noted to be in acute respiratory failure with hypoxia likely secondary to pneumonia and was started on Bipap, however his respiratory status worsen after he could not tolerate bipap and he was subsequently intubated. Throughout the night he was noted to have new onset atrial fibrillation and was started on an amiodarone drip; currently he is back in NSR.,  SUBJECTIVE: Patient awake and alert this morning, on sedation vacation, following commands, requesting ETT removed. Patient extubated to high flow nasal cannula, currently doing well, no complaints.  SIGNIFICANT EVENTS  11/9>>admitted for respiratory distress>>intubated    PAST MEDICAL HISTORY    :  Past Medical History  Diagnosis Date  . Incontinence   . Arthritis   . CLL (chronic lymphocytic  leukemia) ([place])     a. Dx 2003.  . 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 Diverticulitis   . History of prostate cancer   . Hypothyroidism   . Anxiety   . Right middle lobe pneumonia 02/01/2015   Past Surgical History  Procedure Laterality Date  . Replacement total knee      x2  . Prostatectomy  1993  . Colonoscopy  2013-2014?  Marland Kitchen Total hip arthroplasty Right 08/12/2014    Procedure: TOTAL HIP ARTHROPLASTY ANTERIOR APPROACH;  Surgeon: Hessie Knows, MD;  Location: [facility] ORS;  Service: Orthopedics;  Laterality: Right;  . Mohs surgery Right 10/2014    eyebrow   Prior to Admission medications   Medication Sig Start Date End Date Taking? Authorizing Provider  cetirizine (ZYRTEC) 10 MG tablet Take 10 mg by mouth daily.   Yes Historical Provider, MD  Cholecalciferol (VITAMIN D3) 2000 UNITS capsule Take by mouth.   Yes Historical Provider, MD  fluticasone (FLONASE) 50 MCG/ACT nasal spray SPRAY TWICE IN EACH NOSTRIL ONCE DAILY 11/17/14  Yes Historical Provider, MD  furosemide (LASIX) 20 MG tablet Take by mouth.   Yes Historical Provider, MD  HYDROcodone-acetaminophen (NORCO/VICODIN) 5-325 MG tablet Take 1 tablet by mouth every 6 (six) hours as needed for moderate pain or severe pain.  11/28/14  Yes Historical Provider, MD  ibrutinib (IMBRUVICA) 140 MG capsul Take 2 capsules (280 mg total) by mouth daily. Patient taking differently: Take 420 mg by mouth daily.  12/07/14  Yes Leia Alf, MD  levothyroxine (SYNTHROID, LEVOTHROID) 125 MCG tablet Take 125 mcg by mouth daily.   Yes  Historical Provider, MD  levothyroxine (SYNTHROID, LEVOTHROID) 75 MCG tablet Take 75 mcg by mouth daily before breakfast.   Yes Historical Provider, MD  lidocaine-prilocaine (EMLA) cream Apply cream 1 hour before chemotherapy treatment 11/03/14  Yes Leia Alf, MD  methocarbamol (ROBAXIN) 500 MG tablet Take 500 mg by mouth 2 (two) times daily. 01/25/15 02/04/15 Yes Historical Provider, MD  potassium chloride (MICRO-K) 10 MEQ CR  capsule Take by mouth.   Yes Historical Provider, MD  sertraline (ZOLOFT) 50 MG tablet Take 50 mg by mouth at bedtime.  09/28/13  Yes Historical Provider, MD  simvastatin (ZOCOR) 40 MG tablet Take 40 mg by mouth daily at 6 PM.  08/20/13  Yes Historical Provider, MD  zolpidem (AMBIEN) 5 MG tablet Take 5 mg by mouth at bedtime.   Yes Historical Provider, MD   No Known Allergies   FAMILY HISTORY   Family History  Problem Relation Age of Onset  . Heart attack Mother   . Heart attack Father       SOCIAL HISTORY    reports that he quit smoking about 24 years ago. He has never used smokeless tobacco. He reports that he drinks alcohol. He reports that he does not use illicit drugs.  Review of Systems  Constitutional: Positive for malaise/fatigue. Negative for fever, chills and diaphoresis.  HENT: Negative for hearing loss.   Eyes: Negative for blurred vision.  Respiratory: Negative for cough and hemoptysis.        Intubated and awake, following commands  Cardiovascular: Negative for chest pain.  Gastrointestinal: Negative for heartburn and nausea.  Musculoskeletal: Negative for myalgias.  Skin: Negative for itching and rash.  Neurological: Negative for headaches.  Endo/Heme/Allergies: Does not bruise/bleed easily.      VITAL SIGNS    Temp:  [97.5 F (36.4 C)-98.8 F (37.1 C)] 98.8 F (37.1 C) (11/11 0500) Pulse Rate:  [65-139] 73 (11/11 0700) Resp:  [15-31] 18 (11/11 0700) BP: (86-166)/(49-83) 98/58 mmHg (11/11 0700) SpO2:  [92 %-98 %] 92 % (11/11 0700) FiO2 (%):  [40 %-60 %] 40 % (11/11 0800) Weight:  [238 lb 5.1 oz (108.1 kg)] 238 lb 5.1 oz (108.1 kg) (11/11 0426) HEMODYNAMICS:   VENTILATOR SETTINGS: Vent Mode:  [-] PRVC FiO2 (%):  [40 %-60 %] 40 % Set Rate:  [15 bmp] 15 bmp Vt Set:  [580 mL] 580 mL PEEP:  [5 cmH20] 5 cmH20 Plateau Pressure:  [15 cmH20-19 cmH20] 17 cmH20 INTAKE / OUTPUT:  Intake/Output Summary (Last 24 hours) at 02/02/15 0843 Last data filed at  02/02/15 [id number]  Gross per 24 hour  Intake 1093.2 ml  Output   1765 ml  Net -671.8 ml       PHYSICAL EXAM   Physical Exam  Constitutional: He is oriented to person, place, and time. He appears well-developed and well-nourished.  HENT:  Head: Normocephalic and atraumatic.  Eyes: Conjunctivae are normal.  Neck: Normal range of motion. Neck supple.  Cardiovascular: Normal rate, regular rhythm and normal heart sounds.   Pulmonary/Chest: Effort normal and breath sounds normal. No respiratory distress. He has no wheezes.  Mild dec BS at the bases  Abdominal: Soft. Bowel sounds are normal. He exhibits no distension.  Musculoskeletal: Normal range of motion.  Neurological: He is alert and oriented to person, place, and time.  Skin: Skin is warm and dry.  Nursing note and vitals reviewed.      LABS   LABS:  CBC  Recent Labs Lab 02/03/2015 1719 02/01/15 0434 02/02/15  0441  WBC 303.3* 216.4* 176.0*  HGB 11.0* 8.8* 9.2*  HCT 35.1* 28.0* 31.1*  PLT 107* 79* 77*   Coag's No results for input(s): APTT, INR in the last 168 hours. BMET  Recent Labs Lab 01/29/2015 1719 02/01/15 0434  NA 131* 136  K 4.0 3.4*  CL 100* 102  CO2 23 24  BUN 22* 25*  CREATININE 1.14 1.38*  GLUCOSE 155* 176*   Electrolytes  Recent Labs Lab 02/12/2015 1719 02/01/15 0434 02/01/15 1446  CALCIUM 8.7* 8.2*  --   MG  --   --  1.6*  PHOS  --   --  3.7   Sepsis Markers  Recent Labs Lab 02/12/2015 1713 01/24/2015 2244  LATICACIDVEN 1.6 1.5   ABG  Recent Labs Lab 02/19/2015 1735 02/06/2015 2303 02/02/15 0455  PHART 7.47* 7.35 7.41  PCO2ART 32 41 42  PO2ART 55* 93 68*   Liver Enzymes  Recent Labs Lab 01/30/2015 1719  AST 21  ALT 13*  ALKPHOS 82  BILITOT 2.1*  ALBUMIN 3.9   Cardiac Enzymes  Recent Labs Lab 02/10/2015 1719 02/01/15 1446 02/01/15 1835  TROPONINI 0.07* 0.08* 0.09*   Glucose  Recent Labs Lab 02/20/2015 2107 02/01/15 1756 02/01/15 2043 02/01/15 2331  02/02/15 0420 02/02/15 0705  GLUCAP 159* 125* 137* 131* 135* 116*     Recent Results (from the past 240 hour(s))  Blood Culture (routine x 2)     Status: None (Preliminary result)   Collection Time: 01/26/2015  5:12 PM  Result Value Ref Range Status   Specimen Description BLOOD RIGHT HAND  Final   Special Requests BOTTLES DRAWN AEROBIC AND ANAEROBIC [place]  Final   Culture NO GROWTH 2 DAYS  Final   Report Status PENDING  Incomplete  Blood Culture (routine x 2)     Status: None (Preliminary result)   Collection Time: 02/02/2015  5:13 PM  Result Value Ref Range Status   Specimen Description BLOOD RIGHT ARM  Final   Special Requests BOTTLES DRAWN AEROBIC AND ANAEROBIC [place]  Final   Culture NO GROWTH 2 DAYS  Final   Report Status PENDING  Incomplete  Culture, sputum-assessment     Status: None   Collection Time: 02/17/2015  5:20 PM  Result Value Ref Range Status   Specimen Description SPUTUM  Final   Special Requests Immunocompromised  Final   Sputum evaluation THIS SPECIMEN IS ACCEPTABLE FOR SPUTUM CULTURE  Final   Report Status 01/28/2015 FINAL  Final  Culture, respiratory (NON-Expectorated)     Status: None (Preliminary result)   Collection Time: 01/29/2015  5:20 PM  Result Value Ref Range Status   Specimen Description SPUTUM  Final   Special Requests Immunocompromised Reflexed from [id number]  Final   Gram Stain   Final    GOOD SPECIMEN - 80-90% WBCS MODERATE WBC SEEN MANY GRAM POSITIVE COCCI IN CHAINS IN CLUSTERS MANY GRAM NEGATIVE COCCOBACILLI    Culture   Final    HEAVY GROWTH GROUP A STREP (S.PYOGENES) ISOLATED There is no known Penicillin Resistant Beta Streptococcus in the [place] For patients that are Penicillin-allergic, Erythromycin is 85-94% susceptible, and Clindamycin is 80% susceptible.  Contact Microbiology within 7 days if sensitivity testing is  required.      Report Status PENDING  Incomplete  MRSA PCR Screening     Status: None   Collection Time: 02/06/2015 10:47 PM   Result Value Ref Range Status   MRSA by PCR NEGATIVE NEGATIVE Final    Comment:  The GeneXpert MRSA Assay (FDA approved for NASAL specimens only), is one component of a comprehensive MRSA colonization surveillance program. It is not intended to diagnose MRSA infection nor to guide or monitor treatment for MRSA infections.   Culture, respiratory (NON-Expectorated)     Status: None (Preliminary result)   Collection Time: 02/01/15  1:40 AM  Result Value Ref Range Status   Specimen Description TRACHEAL ASPIRATE  Final   Special Requests NONE  Final   Gram Stain   Final    EXCELLENT SPECIMEN - 90-100% WBCS MANY WBC SEEN MANY GRAM POSITIVE COCCI IN PAIRS IN CHAINS FEW GRAM NEGATIVE COCCOBACILLI    Culture NO GROWTH < 12 HOURS  Final   Report Status PENDING  Incomplete     Current facility-administered medications:  .  acetaminophen (TYLENOL) tablet 650 mg, 650 mg, Per Tube, Q6H PRN **OR** [DISCONTINUED] acetaminophen (TYLENOL) suppository 650 mg, 650 mg, Rectal, Q6H PRN, Henreitta Leber, MD .  Margrett Rud amiodarone (NEXTERONE) 1.8 mg/mL load via infusion 150 mg, 150 mg, Intravenous, Once, 150 mg at 02/01/15 0036 **FOLLOWED BY** [EXPIRED] amiodarone (NEXTERONE PREMIX) 360 MG/200ML (1.8 mg/mL) IV infusion, 60 mg/hr, Intravenous, Continuous, Stopped at 02/01/15 0526 **FOLLOWED BY** amiodarone (NEXTERONE PREMIX) 360 MG/200ML (1.8 mg/mL) IV infusion, 30 mg/hr, Intravenous, Continuous, Wilhelmina Mcardle, MD, Last Rate: 16.7 mL/hr at 02/02/15 0215, 30 mg/hr at 02/02/15 0215 .  antiseptic oral rinse solution (CORINZ), 7 mL, Mouth Rinse, QID, Lance Coon, MD, 7 mL at 02/02/15 0429 .  chlorhexidine gluconate (PERIDEX) 0.12 % solution 15 mL, 15 mL, Mouth Rinse, BID, Lance Coon, MD, 15 mL at 02/02/15 0727 .  docusate (COLACE) 50 MG/5ML liquid 100 mg, 100 mg, Per Tube, BID PRN, Wilhelmina Mcardle, MD .  enoxaparin (LOVENOX) injection 40 mg, 40 mg, Subcutaneous, Q24H, Wilhelmina Mcardle, MD, 40 mg  at 02/01/15 2350 .  feeding supplement (PRO-STAT SUGAR FREE 64) liquid 30 mL, 30 mL, Per Tube, QID, Caedin Mogan, MD .  feeding supplement (VITAL HIGH PROTEIN) liquid 1,000 mL, 1,000 mL, Per Tube, Q24H, Zosia Lucchese, MD, 1,000 mL at 02/01/15 1403 .  fentaNYL (SUBLIMAZE) injection 50 mcg, 50 mcg, Intravenous, Q15 min PRN, Wilhelmina Mcardle, MD .  fentaNYL (SUBLIMAZE) injection 50 mcg, 50 mcg, Intravenous, Q2H PRN, Wilhelmina Mcardle, MD .  free water 100 mL, 100 mL, Per Tube, 3 times per day, Vilinda Boehringer, MD, 100 mL at 02/02/15 0543 .  levothyroxine (SYNTHROID, LEVOTHROID) injection 62.5 mcg, 62.5 mcg, Intravenous, Daily, Tico Crotteau, MD, 62.5 mcg at 02/01/15 1128 .  metoprolol (LOPRESSOR) injection 2.5-5 mg, 2.5-5 mg, Intravenous, Q3H PRN, Wilhelmina Mcardle, MD .  midazolam (VERSED) injection 1 mg, 1 mg, Intravenous, Q15 min PRN, Wilhelmina Mcardle, MD .  midazolam (VERSED) injection 1 mg, 1 mg, Intravenous, Q2H PRN, Wilhelmina Mcardle, MD .  [DISCONTINUED] ondansetron [facility]) tablet 4 mg, 4 mg, Oral, Q6H PRN **OR** ondansetron (ZOFRAN) injection 4 mg, 4 mg, Intravenous, Q6H PRN, Henreitta Leber, MD .  pantoprazole sodium (PROTONIX) 40 mg/20 mL oral suspension 40 mg, 40 mg, Per Tube, Q1200, Wilhelmina Mcardle, MD, 40 mg at 02/01/15 1128 .  phenylephrine (NEO-SYNEPHRINE) 40 mg in dextrose 5 % 250 mL (0.16 mg/mL) infusion, 0-200 mcg/min, Intravenous, Titrated, Wilhelmina Mcardle, MD, Stopped at 02/01/15 [phone] .  piperacillin-tazobactam (ZOSYN) IVPB 4.5 g, 4.5 g, Intravenous, 3 times per day, Henreitta Leber, MD, 4.5 g at 02/02/15 0543 .  propofol (DIPRIVAN) 1000 MG/100ML infusion, 0-50 mcg/kg/min, Intravenous, Continuous,  Lance Coon, MD, Last Rate: 26.1 mL/hr at 02/02/15 0726, 40 mcg/kg/min at 02/02/15 0726 .  sertraline (ZOLOFT) tablet 50 mg, 50 mg, Per Tube, QHS, Wilhelmina Mcardle, MD, 50 mg at 02/01/15 2111 .  sodium chloride 0.9 % injection 3 mL, 3 mL, Intravenous, Q12H, Henreitta Leber, MD, 3 mL at 02/01/15  2110 .  vancomycin (VANCOCIN) IVPB 1000 mg/200 mL premix, 1,000 mg, Intravenous, Q12H, Henreitta Leber, MD, 1,000 mg at 02/02/15 [id number]  IMAGING    Dg Chest Port 1 View  02/02/2015  CLINICAL DATA:  Respiratory failure. EXAM: PORTABLE CHEST 1 VIEW COMPARISON:  02/01/2015. FINDINGS: Endotracheal tube, Port-A-Cath, NG tube in stable position. Stable cardiomegaly. Low lung volumes with bibasilar atelectasis. Mild infiltrate in the right lung base again cannot be excluded. No pleural effusion or pneumothorax . IMPRESSION: 1. Lines and tubes in stable position. 2.  Stable cardiomegaly. 3. Low lung volumes with stable bibasilar atelectasis. Mild infiltrate right lung base again cannot be excluded. No interim change. Electronically Signed   By: Marcello Moores  Register   On: 02/02/2015 07:07      Indwelling Urinary Catheter continued, requirement due to   Reason to continue Indwelling Urinary Catheter for strict Intake/Output monitoring for hemodynamic instability   Central Line continued, requirement due to   Reason to continue [employer] Monitoring of central venous pressure or other hemodynamic parameters   Ventilator continued, requirement due to, resp failure    Ventilator Sedation RASS 0 to -2   Cultures: BCx2 11/9>> UC  Sputum 11/9>>Group A strep  Antibiotics: Vanc 11/9>>11/11 Zosyn 11/9>>  Lines:   ASSESSMENT/PLAN  74 year old male admitted for respiratory failure requiring intubation, now extubated to high flow nasal cannula. Noted to have possible right lower lobe/right middle lobe pneumonia, has a past medical history of CLL on active treatment.   PULMONARY OETT 11/9>> A:Hypoxic respiratory failure  RLL/RML PNA  P:   - Extubated to HFNC, wean as tolerated - cont with antibiotics for presumptive PNA, vanc/zosyn - f\u sputum culture>>Group A strep>>stop vanc, cont with zosyn - maintain sats >88% - ABG prn - elevated Cr, if improved, will consider CTA to r/o PE - Ativan  0.5mg  q8hrs PRN for anxiety   CARDIOVASCULAR CVL A:  New onset atrial fibrillation Hypotension CAD  P:  - currently in NSR - check CEx3, EKG - will wean of amiodarone gtt, and start PO meds now that he is extubated.  - cardiology consult.   RENAL -ICU electrolyte replacement protocol - monitor I/O  GASTROINTESTINAL -GI prophylaxis  HEMATOLOGIC A:   CLL Hx of Colon Ca P:  -chemo meds on hold -significantly elevated lymphocytes while on chemo drug - now back to 170s -heme\onc consult appreciated -monitor CBC  INFECTIOUS A:  RLL/RML pneumonia P:   - f\u cultures (sputum and blood) - cont with abx as stated above  ENDOCRINE A:  DM P:   -ssi  NEUROLOGIC RASS goal: -1 - cont with close monitoring    I have personally obtained a history, examined the patient, evaluated laboratory and imaging results, formulated the assessment and plan and placed orders.  The Patient requires high complexity decision making for assessment and support, frequent evaluation and titration of therapies, application of advanced monitoring technologies and extensive interpretation of multiple databases. Critical Care Time devoted to patient care services described in this note is 55 minutes.   Overall, patient is critically ill, prognosis is guarded. Patient at high risk for cardiac arrest and death.   Zyden Suman  Stevenson Clinch, MD [place] [facility] Pager [phone] (please enter 7-digits) On Call Pager [phone] (please enter 7-digits)     02/02/2015, 8:43 AM  Note: This note was prepared with Dragon dictation along with smaller phrase technology. Any transcriptional errors that result from this process are unintentional.

## 2015-02-02 NOTE — Progress Notes (Signed)
Nutrition Follow-up   INTERVENTION:   Meals and Snacks: Cater to patient preferences Medical Food Supplement Therapy: if po intake inadequate on follow-up, recommend addition of nutritional supplement  NUTRITION DIAGNOSIS:   Inadequate oral intake related to acute illness as evidenced by NPO status.  GOAL:   Provide needs based on ASPEN/SCCM guidelines  MONITOR:    (Energy Intake, Anthropometrics, Digestive System, Pulmonary, Electrolyte/Renal Profile)  REASON FOR ASSESSMENT:   Consult, Ventilator Enteral/tube feeding initiation and management  ASSESSMENT:    Pt s/p extubation this AM, currently on HFNC  Past Medical History  Diagnosis Date  . Incontinence   . Arthritis   . CLL (chronic lymphocytic leukemia) ([place])     a. Dx 2003.  . Melanoma ([place])     scalp  . Diverticulitis   . History of prostate cancer   . Hypothyroidism   . Anxiety   . Right middle lobe pneumonia 02/01/2015     Diet Order:  DIET DYS 3 Room service appropriate?: Yes; Fluid consistency:: Thin   Energy Intake: diet just advanced during ICU rounds today, no po intake yet  Skin:  Reviewed, no issues  Electrolyte and Renal Profile:  Recent Labs Lab 02/08/2015 1719 02/01/15 0434 02/01/15 1446  BUN 22* 25*  --   CREATININE 1.14 1.38*  --   NA 131* 136  --   K 4.0 3.4*  --   MG  --   --  1.6*  PHOS  --   --  3.7   Glucose Profile:  Recent Labs  02/02/15 0420 02/02/15 0705 02/02/15 1154  GLUCAP 135* 116* 101*   Meds: reviewed  Height:   Ht Readings from Last 1 Encounters:  02/15/2015 5\' 10"  (D34-534 m)    Weight:   Wt Readings from Last 1 Encounters:  02/02/15 238 lb 5.1 oz (108.1 kg)    Ideal Body Weight:     BMI:  Body mass index is 34.19 kg/(m^2).  Estimated Nutritional Needs:   Kcal:  [id number] kcals (11-14 kcals/kg)   Protein:  150 g (2.0 g/kg) using IBW 75 kg  Fluid:  1875-2250 mL (25-30 ml/kg)   HIGH Care Level  Kerman Passey MS, RD, LDN [phone]  Pager

## 2015-02-02 NOTE — Progress Notes (Signed)
SUBJECTIVE: weaning trial successful. Getting ready for extubation. Maintaining NSR with Amiodarone drip.    Filed Vitals:   02/02/15 0426 02/02/15 0500 02/02/15 0600 02/02/15 0700  BP:  107/51 104/51 98/58  Pulse:  74 73 73  Temp:  98.8 F (37.1 C)    TempSrc:  Oral    Resp:  20 17 18   Height:      Weight: 238 lb 5.1 oz (108.1 kg)     SpO2:  97% 94% 92%    Intake/Output Summary (Last 24 hours) at 02/02/15 0852 Last data filed at 02/02/15 0728  Gross per 24 hour  Intake 1093.2 ml  Output   1765 ml  Net -671.8 ml    LABS: Basic Metabolic Panel:  Recent Labs  01/27/2015 1719 02/01/15 0434 02/01/15 1446  NA 131* 136  --   K 4.0 3.4*  --   CL 100* 102  --   CO2 23 24  --   GLUCOSE 155* 176*  --   BUN 22* 25*  --   CREATININE 1.14 1.38*  --   CALCIUM 8.7* 8.2*  --   MG  --   --  1.6*  PHOS  --   --  3.7   Liver Function Tests:  Recent Labs  02/08/2015 1719  AST 21  ALT 13*  ALKPHOS 82  BILITOT 2.1*  PROT 6.6  ALBUMIN 3.9   No results for input(s): LIPASE, AMYLASE in the last 72 hours. CBC:  Recent Labs  02/10/2015 1719 02/01/15 0434 02/02/15 0441  WBC 303.3* 216.4* 176.0*  NEUTROABS 41.9*  --   --   HGB 11.0* 8.8* 9.2*  HCT 35.1* 28.0* 31.1*  MCV 93.6 95.0 97.7  PLT 107* 79* 77*   Cardiac Enzymes:  Recent Labs  02/10/2015 1719 02/01/15 1446 02/01/15 1835  CKTOTAL  --  42* 35*  TROPONINI 0.07* 0.08* 0.09*   BNP: Invalid input(s): POCBNP D-Dimer: No results for input(s): DDIMER in the last 72 hours. Hemoglobin A1C: No results for input(s): HGBA1C in the last 72 hours. Fasting Lipid Panel:  Recent Labs  02/01/15 0149  TRIG 90   Thyroid Function Tests:  Recent Labs  02/19/2015 1719  TSH 8.274*   Anemia Panel: No results for input(s): VITAMINB12, FOLATE, FERRITIN, TIBC, IRON, RETICCTPCT in the last 72 hours.   PHYSICAL EXAM General: Well developed, well nourished, in no acute distress HEENT:  Normocephalic and atramatic Neck:    No JVD.  Lungs: Clear bilaterally to auscultation and percussion. Heart: HRRR . Normal S1 and S2 without gallops or murmurs.  Abdomen: Bowel sounds are positive, abdomen soft and non-tender  Msk:  Back normal, normal gait. Normal strength and tone for age. Extremities: No clubbing, cyanosis or edema.   Neuro: Alert , intubated.  Psych:  Good affect, responds appropriately  TELEMETRY: Reviewed telemetry pt in NSR.   ASSESSMENT AND PLAN:  1. AF RVR: In setting of RML PNA with acute VDRF. Currently in sinus on amio. Pressures softening. Would cont IV amio while intubated with plan to switch to oral bb vs oral amio once extubated and pressures stable. Check echo. Supp K. TSH elevated - on synthroid - may need adjustment. No prior h/o AF. CHA2DS2VASc = 2 (age). Will have to see what burden of AF once has recovered before making decision about [place]. He is currently anemic and platelet count is 77 K.  2. RML PNA/Acute VDRF: Abx and vent mgmt per CCM.  3. Hypothyroidism: With elevated TSH.  On synthroid @ home. Further w/u / med adjustment per IM.  4. CLL: followed by oncology.   Kathlyn Sacramento, MD, [facility] 02/02/2015 8:52 AM

## 2015-02-02 NOTE — Progress Notes (Signed)
Pharmacy Consult for Vancomycin/Zosyn Indication: pneumonia  No Known Allergies  Patient Measurements: Height: 5\' 10"  (177.8 cm) Weight: 238 lb 5.1 oz (108.1 kg) IBW/kg (Calculated) : 73 Adjusted Body Weight:   Vital Signs: Temp: 98.1 F (36.7 C) (11/11 0900) Temp Source: Oral (11/11 0900) BP: 117/56 mmHg (11/11 1100) Pulse Rate: 77 (11/11 1100) Intake/Output from previous day: 11/10 0701 - 11/11 0700 In: 1409.9 [P.O.:30; I.V.:589.9; NG/GT:490; IV Piggyback:300] Out: 1805 [Urine:1780; Emesis/NG output:25] Intake/Output from this shift: Total I/O In: 630 [NG/GT:30; IV Piggyback:600] Out: -   Labs:  Recent Labs  02/09/2015 1719 02/01/15 0434 02/02/15 0441  WBC 303.3* 216.4* 176.0*  HGB 11.0* 8.8* 9.2*  PLT 107* 79* 77*  CREATININE 1.14 1.38*  --    Estimated Creatinine Clearance: 56.9 mL/min (by C-G formula based on Cr of 1.38). No results for input(s): VANCOTROUGH, VANCOPEAK, VANCORANDOM, GENTTROUGH, GENTPEAK, GENTRANDOM, TOBRATROUGH, TOBRAPEAK, TOBRARND, AMIKACINPEAK, AMIKACINTROU, AMIKACIN in the last 72 hours.   Microbiology: Recent Results (from the past 720 hour(s))  Blood Culture (routine x 2)     Status: None (Preliminary result)   Collection Time: 02/15/2015  5:12 PM  Result Value Ref Range Status   Specimen Description BLOOD RIGHT HAND  Final   Special Requests BOTTLES DRAWN AEROBIC AND ANAEROBIC [place]  Final   Culture NO GROWTH 2 DAYS  Final   Report Status PENDING  Incomplete  Blood Culture (routine x 2)     Status: None (Preliminary result)   Collection Time: 02/14/2015  5:13 PM  Result Value Ref Range Status   Specimen Description BLOOD RIGHT ARM  Final   Special Requests BOTTLES DRAWN AEROBIC AND ANAEROBIC [place]  Final   Culture NO GROWTH 2 DAYS  Final   Report Status PENDING  Incomplete  Culture, sputum-assessment     Status: None   Collection Time: 02/15/2015  5:20 PM  Result Value Ref Range Status   Specimen Description SPUTUM  Final   Special Requests  Immunocompromised  Final   Sputum evaluation THIS SPECIMEN IS ACCEPTABLE FOR SPUTUM CULTURE  Final   Report Status 01/29/2015 FINAL  Final  Culture, respiratory (NON-Expectorated)     Status: None   Collection Time: 02/06/2015  5:20 PM  Result Value Ref Range Status   Specimen Description SPUTUM  Final   Special Requests Immunocompromised Reflexed from [id number]  Final   Gram Stain   Final    GOOD SPECIMEN - 80-90% WBCS MODERATE WBC SEEN MANY GRAM POSITIVE COCCI IN CHAINS IN CLUSTERS MANY GRAM NEGATIVE COCCOBACILLI    Culture   Final    HEAVY GROWTH GROUP A STREP (S.PYOGENES) ISOLATED There is no known Penicillin Resistant Beta Streptococcus in the [place] For patients that are Penicillin-allergic, Erythromycin is 85-94% susceptible, and Clindamycin is 80% susceptible.  Contact Microbiology within 7 days if sensitivity testing is  required.      Report Status 02/02/2015 FINAL  Final  MRSA PCR Screening     Status: None   Collection Time: 01/29/2015 10:47 PM  Result Value Ref Range Status   MRSA by PCR NEGATIVE NEGATIVE Final    Comment:        The GeneXpert MRSA Assay (FDA approved for NASAL specimens only), is one component of a comprehensive MRSA colonization surveillance program. It is not intended to diagnose MRSA infection nor to guide or monitor treatment for MRSA infections.   Culture, respiratory (NON-Expectorated)     Status: None (Preliminary result)   Collection Time: 02/01/15  1:40 AM  Result  Value Ref Range Status   Specimen Description TRACHEAL ASPIRATE  Final   Special Requests NONE  Final   Gram Stain   Final    EXCELLENT SPECIMEN - 90-100% WBCS MANY WBC SEEN MANY GRAM POSITIVE COCCI IN PAIRS IN CHAINS FEW GRAM NEGATIVE COCCOBACILLI    Culture HOLDING FOR POSSIBLE PATHOGEN  Final   Report Status PENDING  Incomplete    Medical History: Past Medical History  Diagnosis Date  . Incontinence   . Arthritis   . CLL (chronic lymphocytic leukemia) ([place])     a. Dx  2003.  . Melanoma ([place])     scalp  . Diverticulitis   . History of prostate cancer   . Hypothyroidism   . Anxiety   . Right middle lobe pneumonia 02/01/2015    Medications:  Prescriptions prior to admission  Medication Sig Dispense Refill Last Dose  . cetirizine (ZYRTEC) 10 MG tablet Take 10 mg by mouth daily.   01/30/2015 at Unknown time  . Cholecalciferol (VITAMIN D3) 2000 UNITS capsule Take by mouth.   01/30/2015 at Unknown time  . fluticasone (FLONASE) 50 MCG/ACT nasal spray SPRAY TWICE IN EACH NOSTRIL ONCE DAILY  12 01/30/2015 at Unknown time  . furosemide (LASIX) 20 MG tablet Take by mouth.   prn at prn  . HYDROcodone-acetaminophen (NORCO/VICODIN) 5-325 MG tablet Take 1 tablet by mouth every 6 (six) hours as needed for moderate pain or severe pain.    01/30/2015 at Unknown time  . ibrutinib (IMBRUVICA) 140 MG capsul Take 2 capsules (280 mg total) by mouth daily. (Patient taking differently: Take 420 mg by mouth daily. ) 60 capsule 3 02/12/2015 at Unknown time  . levothyroxine (SYNTHROID, LEVOTHROID) 125 MCG tablet Take 125 mcg by mouth daily.   01/30/2015 at Unknown time  . levothyroxine (SYNTHROID, LEVOTHROID) 75 MCG tablet Take 75 mcg by mouth daily before breakfast.   01/30/2015 at Unknown time  . lidocaine-prilocaine (EMLA) cream Apply cream 1 hour before chemotherapy treatment 30 g 1 Past Month at Unknown time  . methocarbamol (ROBAXIN) 500 MG tablet Take 500 mg by mouth 2 (two) times daily.  0 01/30/2015 at Unknown time  . potassium chloride (MICRO-K) 10 MEQ CR capsule Take by mouth.   prn at prn  . sertraline (ZOLOFT) 50 MG tablet Take 50 mg by mouth at bedtime.    01/30/2015 at Unknown time  . simvastatin (ZOCOR) 40 MG tablet Take 40 mg by mouth daily at 6 PM.    01/30/2015 at Unknown time  . zolpidem (AMBIEN) 5 MG tablet Take 5 mg by mouth at bedtime.   01/30/2015 at Unknown time   Assessment: 74 y/o M with CLL admitted with respiratory failure secondary to pneumonia. Respiratory  cultures are growing group A strep and gram negative coccobacilli. Vancomycin discontinued and will need to further deescalate antibiotics once culture/senstivities are fully resulted.   Goal of Therapy:  Resolution of Infection  Plan:  Expected duration 7 days with resolution of temperature and/or normalization of WBC   Will continue  Zosyn 4.5 g EI q 8 hours.   Will continue to follow renal function and culture results and recommend deescalating antibiotics as appropriate.   Ulice Dash D 02/02/2015,12:34 PM

## 2015-02-02 NOTE — Care Management Note (Signed)
Case Management Note  Patient Details  Name: Dean Collins MRN: [id number] Date of Birth: 1940/08/11  Subjective/Objective: Successful extubation this morning. On HFNC. Requested PT consult.                   Action/Plan:   Expected Discharge Date:                  Expected Discharge Plan:     In-House Referral:     Discharge planning Services     Post Acute Care Choice:    Choice offered to:     DME Arranged:    DME Agency:     HH Arranged:    [place] Agency:     Status of Service:     Medicare Important Message Given:    Date Medicare IM Given:    Medicare IM give by:    Date Additional Medicare IM Given:    Additional Medicare Important Message give by:     If discussed at [place] of Stay Meetings, dates discussed:    Additional Comments:  Jolly Mango, RN 02/02/2015, 12:10 PM

## 2015-02-02 NOTE — Progress Notes (Signed)
RT to room to NT suction patient.  NT suctioned left nare times one and right nare times one.  Moderate return of yellow, thick secretions.  Patient tolerated well and verbalized he would notify RT if needed NT suction again. O2 sat post NT suction 94% on 40% at 45 LPM HFNC

## 2015-02-02 NOTE — Progress Notes (Signed)
eLink Physician-Brief Progress Note Patient Name: [name] DOB: 1940-10-14 MRN: [id number]   Date of Service  02/02/2015  HPI/Events of Note  Anxiety O2 saturation stable, tachypneic but cough OK, moving air OK  O2 needs stable throughout course of day  eICU Interventions  Increase ativan     Intervention Category Minor Interventions: Agitation / anxiety - evaluation and management  Simonne Maffucci 02/02/2015, 6:49 PM

## 2015-02-02 NOTE — Progress Notes (Signed)
[facility] [facility] [place] NAME: Dean Collins    MR#:  [id number]  DATE OF BIRTH:  05-Oct-1940  SUBJECTIVE:  CHIEF COMPLAINT:  Patient is extubated, and on  high flow oxygen. Short of breath with minimal exertion  REVIEW OF SYSTEMS:  Review of system limited as patient is with exertional dyspnea HEENT-denies any headache or blurry vision Lungs-dyspnea with minimal exertion, positive cough Cardio-denies any chest pain GI-denies nausea vomiting or diarrhea. Denies any abdominal pain  DRUG ALLERGIES:  No Known Allergies  VITALS:  Blood pressure 123/87, pulse 95, temperature 98.5 F (36.9 C), temperature source Oral, resp. rate 28, height 5\' 10"  (1.778 m), weight 108.1 kg (238 lb 5.1 oz), SpO2 91 %.  PHYSICAL EXAMINATION:  GENERAL:  74 y.o.-year-old patient lying in the bed with no acute distress.  EYES: Pupils equal, round, reactive to light and accommodation. No scleral icterus. HEENT: Head atraumatic, normocephalic.  NECK:  Supple, no jugular venous distention. No thyroid enlargement, no tenderness.  LUNGS: On high flow oxygen. Moderate air entry with diffuse rhonchi.  Some use of accessory muscles of respiration.  CARDIOVASCULAR: Irregularly irregular. No murmurs, rubs, or gallops.  ABDOMEN: Soft, nontender, nondistended. Bowel sounds present. No organomegaly or mass.  EXTREMITIES: No pedal edema, cyanosis, or clubbing.  NEUROLOGIC: Patient is awake, alert and oriented PSYCHIATRIC: The patient is with normal mood and affect SKIN: No obvious rash, lesion, or ulcer.    LABORATORY PANEL:   CBC  Recent Labs Lab 02/02/15 0441  WBC 176.0*  HGB 9.2*  HCT 31.1*  PLT 77*   ------------------------------------------------------------------------------------------------------------------  Chemistries   Recent Labs Lab 02/14/2015 1719 02/01/15 0434 02/01/15 1446  NA 131* 136  --   K 4.0 3.4*  --   CL 100* 102  --   CO2 23 24   --   GLUCOSE 155* 176*  --   BUN 22* 25*  --   CREATININE 1.14 1.38*  --   CALCIUM 8.7* 8.2*  --   MG  --   --  1.6*  AST 21  --   --   ALT 13*  --   --   ALKPHOS 82  --   --   BILITOT 2.1*  --   --    ------------------------------------------------------------------------------------------------------------------  Cardiac Enzymes  Recent Labs Lab 02/01/15 1835  TROPONINI 0.09*   ------------------------------------------------------------------------------------------------------------------  RADIOLOGY:  Dg Abd 1 View  02/01/2015  CLINICAL DATA:  Respiratory failure EXAM: ABDOMEN - 1 VIEW COMPARISON:  02/02/2015 FINDINGS: Normal bowel gas pattern. Negative for obstruction or ileus. NG tube in the stomach. No renal calculi Right hip replacement. Surgical clips in the pelvis from prior prostatectomy. Penile prosthesis reservoir in the left lower quadrant. IMPRESSION: No acute abnormality. Electronically Signed   By: Franchot Gallo M.D.   On: 02/01/2015 09:03   Dg Abd 1 View  01/29/2015  CLINICAL DATA:  Endotracheal tube placement. EXAM: PORTABLE CHEST 1 VIEW COMPARISON:  CT scan 04/20/2014. FINDINGS: Chest x-ray: The endotracheal tube is 3 cm above the carina. The NG tube is coursing down the esophagus and into the stomach. A left subclavian power port is noted. The tip is in the mid SVC. The heart is upper limits of normal in size. Moderate elevation of the right hemidiaphragm with overlying vascular crowding and atelectasis. No edema or definite infiltrates. No large effusions. Abdomen: The NG tube tip is in the antral region of the stomach. There is scattered air in the  small bowel and colon but no definite findings for obstruction or perforation. A right hip prosthesis is noted. A reservoir for a penile prosthesis is noted on the left side. Advanced degenerative changes involving the lower lumbar spine. IMPRESSION: 1. The endotracheal tube and NG tubes are in good position. 2. No  significant pulmonary findings. 3. No plain film findings for an acute abdominal process. Possible mild ileus. Electronically Signed   By: Marijo Sanes M.D.   On: 02/10/2015 22:49   Dg Chest Port 1 View  02/02/2015  CLINICAL DATA:  Respiratory failure. EXAM: PORTABLE CHEST 1 VIEW COMPARISON:  02/01/2015. FINDINGS: Endotracheal tube, Port-A-Cath, NG tube in stable position. Stable cardiomegaly. Low lung volumes with bibasilar atelectasis. Mild infiltrate in the right lung base again cannot be excluded. No pleural effusion or pneumothorax . IMPRESSION: 1. Lines and tubes in stable position. 2.  Stable cardiomegaly. 3. Low lung volumes with stable bibasilar atelectasis. Mild infiltrate right lung base again cannot be excluded. No interim change. Electronically Signed   By: [place]   On: 02/02/2015 07:07   Dg Chest Port 1 View  02/01/2015  CLINICAL DATA:  Shortness of breath.  Respiratory failure . EXAM: PORTABLE CHEST 1 VIEW COMPARISON:  01/24/2015. FINDINGS: Endotracheal tube and NG tube stable position. Port-A-Cath in stable position. Low lung volumes with mild basilar atelectasis again noted. Mild infiltrate right lung base cannot be excluded. Stable cardiomegaly. No pleural effusion or pneumothorax . IMPRESSION: 1. Lines and tubes in stable position. 2. Stable cardiomegaly. 3. Low lung volumes with stable bibasilar atelectasis. Mild infiltrate right lung base cannot be excluded . Electronically Signed   By: Marcello Moores  Register   On: 02/01/2015 08:26   Dg Chest Port 1 View  02/10/2015  CLINICAL DATA:  Endotracheal tube placement. EXAM: PORTABLE CHEST 1 VIEW COMPARISON:  CT scan 04/20/2014. FINDINGS: Chest x-ray: The endotracheal tube is 3 cm above the carina. The NG tube is coursing down the esophagus and into the stomach. A left subclavian power port is noted. The tip is in the mid SVC. The heart is upper limits of normal in size. Moderate elevation of the right hemidiaphragm with overlying  vascular crowding and atelectasis. No edema or definite infiltrates. No large effusions. Abdomen: The NG tube tip is in the antral region of the stomach. There is scattered air in the small bowel and colon but no definite findings for obstruction or perforation. A right hip prosthesis is noted. A reservoir for a penile prosthesis is noted on the left side. Advanced degenerative changes involving the lower lumbar spine. IMPRESSION: 1. The endotracheal tube and NG tubes are in good position. 2. No significant pulmonary findings. 3. No plain film findings for an acute abdominal process. Possible mild ileus. Electronically Signed   By: Marijo Sanes M.D.   On: 02/07/2015 22:49    EKG:   Orders placed or performed during the hospital encounter of 01/25/2015  . ED EKG 12-Lead  . ED EKG 12-Lead  . EKG 12-Lead  . EKG 12-Lead    ASSESSMENT AND PLAN:   74 year old male with past medical history of CLL, hypothyroidism, depression, hyperlipidemia, history of prostate cancer, who presented to the hospital due to shortness of breath, weakness, sputum production and noted to be in acute respiratory failure with hypoxia.  #1 acute respiratory failure with hypoxia- secondary to pneumonia. -Extubated , on high flow oxygen  -Since he is immunosuppressed given his underlying malignancy treated with broad-spectrum IV antibiotics with vancomycin, Zosyn., Changed  to Rocephin and sputum is showing gram-positive cocaine. -Follow blood and urine culture sputum cultures with gram-positive cocci in pairs  #A. fib with RVR Well controlled with amiodarone. Discontinued  amiodarone drip and patient is started on by mouth amiodarone Appreciate cardiology recommendations-we will follow up with Dr. Fletcher Anon   # pneumonia-likely the cause of patient's acute respiratory failure with hypoxia. -Discontinued IV Zosyn and vancomycin. Patient is started on IV Rocephin and sputum is with gram-positive cocaine.  # history of  CLL-patient's white cell count is significantly elevated. -Appreciate oncology consult -Sent peripheral blood flow cytometry to see  Blastic transformation -continue to  hold his chemotherapy med given his acute infectious process.  #hypothyroidism-TSH is elevated .continue Synthroid. We will adjust the dose based on his home medication dose  # hyperlipidemia-continue simvastatin.  # depression, continue Zoloft.     All the records are reviewed and case discussed with Care Management/Social Workerr. Management plans discussed with the patient's wife and she is  in agreement.  CODE STATUS: fc  TOTAL CRITICAL CARE TIME TAKING CARE OF THIS PATIENT: 35 minutes.   POSSIBLE D/C IN 2-3  DAYS, DEPENDING ON CLINICAL CONDITION.   Nicholes Mango M.D on 02/02/2015 at 5:50 PM  Between 7am to 6pm - Pager - [phone] After 6pm go to [email or web address] - password [email or web address] [facility]  Office  [phone]  CC: Primary care physician; Idelle Crouch, MD

## 2015-02-03 ENCOUNTER — Inpatient Hospital Stay: Payer: Medicare Other

## 2015-02-03 DIAGNOSIS — C9110 Chronic lymphocytic leukemia of B-cell type not having achieved remission: Secondary | ICD-10-CM

## 2015-02-03 LAB — BASIC METABOLIC PANEL
Anion gap: 7 (ref 5–15)
BUN: 26 mg/dL — ABNORMAL HIGH (ref 6–20)
CALCIUM: 8.4 mg/dL — AB (ref 8.9–10.3)
CO2: 28 mmol/L (ref 22–32)
CREATININE: 1.26 mg/dL — AB (ref 0.61–1.24)
Chloride: 105 mmol/L (ref 101–111)
GFR calc non Af Amer: 54 mL/min — ABNORMAL LOW (ref >60)
GLUCOSE: 182 mg/dL — AB (ref 65–99)
Potassium: 3 mmol/L — ABNORMAL LOW (ref 3.5–5.1)
Sodium: 140 mmol/L (ref 135–145)

## 2015-02-03 LAB — BLOOD GAS, ARTERIAL
ACID-BASE EXCESS: 1.7 mmol/L (ref 0.0–3.0)
Bicarbonate: 26.6 mEq/L (ref 21.0–28.0)
FIO2: 0.55
O2 SAT: 92 %
PCO2 ART: 42 mmHg (ref 32.0–48.0)
Patient temperature: 37
pH, Arterial: 7.41 (ref 7.350–7.450)
pO2, Arterial: 63 mmHg — ABNORMAL LOW (ref 83.0–108.0)

## 2015-02-03 LAB — LEGIONELLA PNEUMOPHILA SEROGP 1 UR AG: L. pneumophila Serogp 1 Ur Ag: NEGATIVE

## 2015-02-03 MED ORDER — AMIODARONE HCL 200 MG PO TABS
400.0000 mg | ORAL_TABLET | Freq: Two times a day (BID) | ORAL | Status: DC
  Administered 2015-02-03 – 2015-02-08 (×11): 400 mg via ORAL
  Filled 2015-02-03 (×7): qty 2
  Filled 2015-02-03: qty 1
  Filled 2015-02-03 (×5): qty 2

## 2015-02-03 MED ORDER — POTASSIUM CHLORIDE CRYS ER 20 MEQ PO TBCR
40.0000 meq | EXTENDED_RELEASE_TABLET | Freq: Once | ORAL | Status: AC
  Administered 2015-02-03: 40 meq via ORAL
  Filled 2015-02-03: qty 2

## 2015-02-03 MED ORDER — ACETAMINOPHEN 325 MG PO TABS
650.0000 mg | ORAL_TABLET | Freq: Four times a day (QID) | ORAL | Status: DC | PRN
  Administered 2015-02-07: 650 mg via ORAL
  Filled 2015-02-03: qty 2

## 2015-02-03 MED ORDER — LEVOTHYROXINE SODIUM 75 MCG PO TABS
125.0000 ug | ORAL_TABLET | Freq: Every day | ORAL | Status: DC
  Administered 2015-02-04 – 2015-02-11 (×8): 125 ug via ORAL
  Filled 2015-02-03 (×9): qty 1

## 2015-02-03 MED ORDER — LEVOTHYROXINE SODIUM 75 MCG PO TABS: 112.5000 ug | ORAL_TABLET | Freq: Every day | ORAL | Status: DC

## 2015-02-03 MED ORDER — SODIUM CHLORIDE 0.9 % IJ SOLN
INTRAMUSCULAR | Status: AC
  Administered 2015-02-03: 3 mL via INTRAVENOUS
  Filled 2015-02-03: qty 10

## 2015-02-03 MED ORDER — SERTRALINE HCL 50 MG PO TABS
50.0000 mg | ORAL_TABLET | Freq: Every day | ORAL | Status: DC
  Administered 2015-02-03 – 2015-02-10 (×8): 50 mg via ORAL
  Filled 2015-02-03 (×8): qty 1

## 2015-02-03 MED ORDER — MAGIC MOUTHWASH
10.0000 mL | Freq: Four times a day (QID) | ORAL | Status: DC | PRN
  Administered 2015-02-03: 10 mL via ORAL
  Filled 2015-02-03: qty 10

## 2015-02-03 NOTE — Progress Notes (Signed)
No new complaints No distress Extubated 02/02/15 Still requiring high flow [place] O2  Filed Vitals:   02/03/15 0600 02/03/15 0753 02/03/15 0800 02/03/15 0939  BP: 131/73 119/63 132/67   Pulse: 85 76 83   Temp:  98.3 F (36.8 C)    TempSrc:  Oral    Resp: 27 27 25    Height:      Weight:      SpO2: 90% 95% 94% 92%   Frail, no overt distress HEENT WNL No JVD noted No wheezes IRIR, no M noted NABS, soft Ext warm, no edema   BMP Latest Ref Rng 02/03/2015 02/01/2015 02/13/2015  Glucose 65 - 99 mg/dL 182(H) 176(H) 155(H)  BUN 6 - 20 mg/dL 26(H) 25(H) 22(H)  Creatinine 0.61 - 1.24 mg/dL 1.26(H) 1.38(H) 1.14  Sodium 135 - 145 mmol/L 140 136 131(L)  Potassium 3.5 - 5.1 mmol/L 3.0(L) 3.4(L) 4.0  Chloride 101 - 111 mmol/L 105 102 100(L)  CO2 22 - 32 mmol/L 28 24 23   Calcium 8.9 - 10.3 mg/dL 8.4(L) 8.2(L) 8.7(L)    CBC Latest Ref Rng 02/02/2015 02/01/2015 02/01/2015  WBC 3.8 - 10.6 K/uL 176.0(HH) 216.4(HH) 303.3(HH)  Hemoglobin 13.0 - 18.0 g/dL 9.2(L) 8.8(L) 11.0(L)  Hematocrit 40.0 - 52.0 % 31.1(L) 28.0(L) 35.1(L)  Platelets 150 - 440 K/uL 77(L) 79(L) 107(L)   Results for orders placed or performed during the hospital encounter of 01/30/2015  Blood Culture (routine x 2)     Status: None (Preliminary result)   Collection Time: 02/19/2015  5:12 PM  Result Value Ref Range Status   Specimen Description BLOOD RIGHT HAND  Final   Special Requests BOTTLES DRAWN AEROBIC AND ANAEROBIC [place]  Final   Culture NO GROWTH 3 DAYS  Final   Report Status PENDING  Incomplete  Blood Culture (routine x 2)     Status: None (Preliminary result)   Collection Time: 02/20/2015  5:13 PM  Result Value Ref Range Status   Specimen Description BLOOD RIGHT ARM  Final   Special Requests BOTTLES DRAWN AEROBIC AND ANAEROBIC [place]  Final   Culture NO GROWTH 3 DAYS  Final   Report Status PENDING  Incomplete  Culture, sputum-assessment     Status: None   Collection Time: 02/10/2015  5:20 PM  Result Value Ref Range Status    Specimen Description SPUTUM  Final   Special Requests Immunocompromised  Final   Sputum evaluation THIS SPECIMEN IS ACCEPTABLE FOR SPUTUM CULTURE  Final   Report Status 01/25/2015 FINAL  Final  Urine culture     Status: None (Preliminary result)   Collection Time: 02/17/2015  5:20 PM  Result Value Ref Range Status   Specimen Description URINE, RANDOM  Final   Special Requests Immunocompromised  Final   Culture NO GROWTH < 24 HOURS  Final   Report Status PENDING  Incomplete  Culture, respiratory (NON-Expectorated)     Status: None   Collection Time: 02/13/2015  5:20 PM  Result Value Ref Range Status   Specimen Description SPUTUM  Final   Special Requests Immunocompromised Reflexed from [id number]  Final   Gram Stain   Final    GOOD SPECIMEN - 80-90% WBCS MODERATE WBC SEEN MANY GRAM POSITIVE COCCI IN CHAINS IN CLUSTERS MANY GRAM NEGATIVE COCCOBACILLI    Culture   Final    HEAVY GROWTH GROUP A STREP (S.PYOGENES) ISOLATED There is no known Penicillin Resistant Beta Streptococcus in the [place] For patients that are Penicillin-allergic, Erythromycin is 85-94% susceptible, and Clindamycin is 80% susceptible.  Contact Microbiology within 7 days if sensitivity testing is  required.      Report Status 02/02/2015 FINAL  Final  MRSA PCR Screening     Status: None   Collection Time: 02/06/2015 10:47 PM  Result Value Ref Range Status   MRSA by PCR NEGATIVE NEGATIVE Final    Comment:        The GeneXpert MRSA Assay (FDA approved for NASAL specimens only), is one component of a comprehensive MRSA colonization surveillance program. It is not intended to diagnose MRSA infection nor to guide or monitor treatment for MRSA infections.   Culture, respiratory (NON-Expectorated)     Status: None (Preliminary result)   Collection Time: 02/01/15  1:40 AM  Result Value Ref Range Status   Specimen Description TRACHEAL ASPIRATE  Final   Special Requests NONE  Final   Gram Stain   Final    EXCELLENT  SPECIMEN - 90-100% WBCS MANY WBC SEEN MANY GRAM POSITIVE COCCI IN PAIRS IN CHAINS FEW GRAM NEGATIVE COCCOBACILLI    Culture   Final    RARE GROWTH GROUP A STREP (S.PYOGENES) ISOLATED There is no known Penicillin Resistant Beta Streptococcus in the [place] For patients that are Penicillin-allergic, Erythromycin is 85-94% susceptible, and Clindamycin is 80% susceptible.  Contact Microbiology within 7 days if sensitivity testing is  required.      Report Status PENDING  Incomplete  Urine culture     Status: None (Preliminary result)   Collection Time: 02/02/15  2:35 PM  Result Value Ref Range Status   Specimen Description URINE, RANDOM  Final   Special Requests Immunocompromised  Final   Culture NO GROWTH < 24 HOURS  Final   Report Status PENDING  Incomplete   Anti-infectives    Start     Dose/Rate Route Frequency Ordered Stop   02/02/15 1500  cefTRIAXone (ROCEPHIN) 2 g in dextrose 5 % 50 mL IVPB     2 g 100 mL/hr over 30 Minutes Intravenous Every 24 hours 02/02/15 1424     02/01/15 0700  vancomycin (VANCOCIN) IVPB 1000 mg/200 mL premix  Status:  Discontinued     1,000 mg 200 mL/hr over 60 Minutes Intravenous Every 12 hours 02/18/2015 2229 02/02/15 1042   02/04/2015 2245  piperacillin-tazobactam (ZOSYN) IVPB 4.5 g  Status:  Discontinued     4.5 g 25 mL/hr over 240 Minutes Intravenous 3 times per day 02/04/2015 2235 02/02/15 1424   02/21/2015 1800  vancomycin (VANCOCIN) IVPB 1000 mg/200 mL premix     1,000 mg 200 mL/hr over 60 Minutes Intravenous  Once 02/14/2015 1759 02/13/2015 2006   02/08/2015 1800  cefTRIAXone (ROCEPHIN) 1 g in dextrose 5 % 50 mL IVPB     1 g 100 mL/hr over 30 Minutes Intravenous  Once 02/07/2015 1759 01/23/2015 1952    TTE 11/11: LVEF 55 - 60%  CXR: NNF  IMPRESSION: Acute respiratory failure RLL PNA New onset AF - rate controlled on amiodarone Hypokalemia TCP CLL with severe leukocytosis Anxiety  PLAN/REC: Changed status to SDU level Cont high flow O2 by [place] Cont  ceftriaxone Change amiodarone to PO Monitor BMET intermittently Monitor I/Os Correct electrolytes as indicated DVT px: enoxaparin Monitor CBC intermittently Transfuse per usual guidelines Low dose lorazepam PRN  Merton Border, MD PCCM service Mobile [phone] Pager [phone]

## 2015-02-03 NOTE — Progress Notes (Signed)
[place] [facility] [place] NAME: Dean Collins    MR#:  [id number]  DATE OF BIRTH:  [birth year]  SUBJECTIVE:  CHIEF COMPLAINT:  Patient is extubated 11/11 and on  high flow oxygen. Short of breath with minimal exertion Reporting sores in his mouth  REVIEW OF SYSTEMS:  Review of system limited as patient is with exertional dyspnea HEENT-denies any headache or blurry vision Lungs-dyspnea with minimal exertion, positive cough Cardio-denies any chest pain GI-denies nausea vomiting or diarrhea. Denies any abdominal pain  DRUG ALLERGIES:  No Known Allergies  VITALS:  Blood pressure 132/67, pulse 83, temperature 98.3 F (36.8 C), temperature source Oral, resp. rate 25, height 5\' 10"  (1.778 m), weight 108 kg (238 lb 1.6 oz), SpO2 92 %.  PHYSICAL EXAMINATION:  GENERAL:  74 y.o.-year-old patient lying in the bed with no acute distress.  EYES: Pupils equal, round, reactive to light and accommodation. No scleral icterus. HEENT: Head atraumatic, normocephalic.  NECK:  Supple, no jugular venous distention. No thyroid enlargement, no tenderness.  LUNGS: On high flow oxygen. Moderate air entry with diffuse rhonchi.  Some use of accessory muscles of respiration.  CARDIOVASCULAR: Irregularly irregular. No murmurs, rubs, or gallops.  ABDOMEN: Soft, nontender, nondistended. Bowel sounds present. No organomegaly or mass.  EXTREMITIES: No pedal edema, cyanosis, or clubbing.  NEUROLOGIC: Patient is awake, alert and oriented PSYCHIATRIC: The patient is with normal mood and affect SKIN: No obvious rash, lesion, or ulcer.    LABORATORY PANEL:   CBC  Recent Labs Lab 02/02/15 0441  WBC 176.0*  HGB 9.2*  HCT 31.1*  PLT 77*   ------------------------------------------------------------------------------------------------------------------  Chemistries   Recent Labs Lab 02/15/2015 1719  02/01/15 1446 02/03/15 1108  NA 131*  < >  --  140  K 4.0  < >   --  3.0*  CL 100*  < >  --  105  CO2 23  < >  --  28  GLUCOSE 155*  < >  --  182*  BUN 22*  < >  --  26*  CREATININE 1.14  < >  --  1.26*  CALCIUM 8.7*  < >  --  8.4*  MG  --   --  1.6*  --   AST 21  --   --   --   ALT 13*  --   --   --   ALKPHOS 82  --   --   --   BILITOT 2.1*  --   --   --   < > = values in this interval not displayed. ------------------------------------------------------------------------------------------------------------------  Cardiac Enzymes  Recent Labs Lab 02/01/15 1835  TROPONINI 0.09*   ------------------------------------------------------------------------------------------------------------------  RADIOLOGY:  Dg Chest Port 1 View  02/02/2015  CLINICAL DATA:  Respiratory failure. EXAM: PORTABLE CHEST 1 VIEW COMPARISON:  02/01/2015. FINDINGS: Endotracheal tube, Port-A-Cath, NG tube in stable position. Stable cardiomegaly. Low lung volumes with bibasilar atelectasis. Mild infiltrate in the right lung base again cannot be excluded. No pleural effusion or pneumothorax . IMPRESSION: 1. Lines and tubes in stable position. 2.  Stable cardiomegaly. 3. Low lung volumes with stable bibasilar atelectasis. Mild infiltrate right lung base again cannot be excluded. No interim change. Electronically Signed   By: Marcello Moores  Register   On: 02/02/2015 07:07    EKG:   Orders placed or performed during the hospital encounter of 02/02/2015  . ED EKG 12-Lead  . ED EKG 12-Lead  . EKG 12-Lead  .  EKG 12-Lead    ASSESSMENT AND PLAN:   74 year old male with past medical history of CLL, hypothyroidism, depression, hyperlipidemia, history of prostate cancer, who presented to the hospital due to shortness of breath, weakness, sputum production and noted to be in acute respiratory failure with hypoxia.  #1 acute respiratory failure with hypoxia- secondary to pneumonia. -Extubated 11/11, on high flow oxygen since extubation -Since he is immunosuppressed given his underlying  malignancy treated with broad-spectrum IV antibiotics with vancomycin, Zosyn., Changed to Rocephin and sputum is showing gram-positive cocci, group A streptococcus - blood CX NTD urine culture NTD sputum cultures with gram-positive cocci in pairs, group B strep sensitive to penicillin  #A. fib with RVR Well controlled with amiodarone. Discontinued  amiodarone drip and patient is started on by mouth amiodarone Appreciate cardiology recommendations-we will follow up with Dr. Fletcher Anon   # pneumonia-likely the cause of patient's acute respiratory failure with hypoxia. -Discontinued IV Zosyn and vancomycin. Patient is started on IV Rocephin and sputum is with gram-positive cocaine.  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 -Appreciate oncology recommendations -Sent peripheral blood flow cytometry to see  Blastic transformation -continue to  hold his chemotherapy med given his acute infectious process.  #hypothyroidism-TSH is elevated .continue Synthroid at 125 g per day. As reported by patient's daughter patient takes 125 g of Synthroid..  # hyperlipidemia-continue simvastatin.  # depression, continue Zoloft.     All the records are reviewed and case discussed with Care Management/Social Workerr. Management plans discussed with the patient's wife and she is  in agreement.  CODE STATUS: fc  TOTAL CRITICAL CARE TIME TAKING CARE OF THIS PATIENT: 35 minutes.   POSSIBLE D/C IN 2-3  DAYS, DEPENDING ON CLINICAL CONDITION.   Nicholes Mango M.D on 02/03/2015 at 2:42 PM  Between 7am to 6pm - Pager - [phone] After 6pm go to [email or web address] - password [email or web address] [facility]  Office  [phone]  CC: Primary care physician; Idelle Crouch, MD

## 2015-02-03 NOTE — Progress Notes (Signed)
    SUBJECTIVE:  Feeling better since he coughed up some thick phlegm.  No chest pain.  Still SOB.    PHYSICAL EXAM Filed Vitals:   02/03/15 0600 02/03/15 0753 02/03/15 0800 02/03/15 0939  BP: 131/73 119/63 132/67   Pulse: 85 76 83   Temp:  98.3 F (36.8 C)    TempSrc:  Oral    Resp: 27 27 25    Height:      Weight:      SpO2: 90% 95% 94% 92%   General:  No acute distress Lungs:  Decreased breath sounds throughtout Heart:  RRR Abdomen:  Positive bowel sounds, no rebound no guarding Extremities:  Trace edema   LABS: Lab Results  Component Value Date   TROPONINI 0.09* 02/01/2015   Results for orders placed or performed during the hospital encounter of 02/10/2015 (from the past 24 hour(s))  Glucose, capillary     Status: Abnormal   Collection Time: 02/02/15 11:54 AM  Result Value Ref Range   Glucose-Capillary 101 (H) 65 - 99 mg/dL  Urine culture     Status: None (Preliminary result)   Collection Time: 02/02/15  2:35 PM  Result Value Ref Range   Specimen Description URINE, RANDOM    Special Requests Immunocompromised    Culture NO GROWTH < 24 HOURS    Report Status PENDING   Blood gas, arterial     Status: Abnormal   Collection Time: 02/03/15  5:49 AM  Result Value Ref Range   FIO2 0.55    Delivery systems HI FLOW NASAL CANNULA    pH, Arterial 7.41 7.350 - 7.450   pCO2 arterial 42 32.0 - 48.0 mmHg   pO2, Arterial 63 (L) 83.0 - 108.0 mmHg   Bicarbonate 26.6 21.0 - 28.0 mEq/L   Acid-Base Excess 1.7 0.0 - 3.0 mmol/L   O2 Saturation 92.0 %   Patient temperature 37.0    Collection site RIGHT RADIAL    Sample type ARTERIAL DRAW    Allens test (pass/fail) PASS PASS    Intake/Output Summary (Last 24 hours) at 02/03/15 1102 Last data filed at 02/03/15 0938  Gross per 24 hour  Intake 1207.3 ml  Output   1650 ml  Net -442.7 ml    ECHO:  - Left ventricle: The cavity size was normal. There was mild concentric hypertrophy. Systolic function was normal. The estimated  ejection fraction was in the range of 55% to 60%. Wall motion was normal; there were no regional wall motion abnormalities. The study is not technically sufficient to allow evaluation of LV diastolic function. - Left atrium: The atrium was mildly dilated. - Pulmonary arteries: Systolic pressure was mildly increased. PA peak pressure: 42 mm Hg (S).  ASSESSMENT AND PLAN:  AF RVR: Maintaining NSR.   IV amio was just stopped.  On PO for now.  Given this first episode, the higher risk for bleeding with his comorbid conditions and the self limited nature of this event I would not suggest chronic anticoagulation.    RML PNA/Acute VDRF: Per primary team and CCM  CLL: followed by oncology.   ELEVATED TROPONIN:  This is non specific in this situation.  Not a STEMI.  Can consider out patient stress testing in the future.    Jeneen Rinks [facility] 02/03/2015 11:02 AM

## 2015-02-03 NOTE — Progress Notes (Signed)
PT Cancellation Note  Patient Details Name: Dean Collins MRN: [id number] DOB: 11-05-1940   Cancelled Treatment:    Reason Eval/Treat Not Completed: Other (comment) (See PT note for further details ) PT consult attempted this morning, however, per communication with nursing staff, pt currently short of breath with very minor mobility and that PT consult may be more appropriate this afternoon. Will attempt at later time/date when more appropriate.    Janyth Contes 02/03/2015, 10:05 AM  Janyth Contes, SPT. [phone]

## 2015-02-04 ENCOUNTER — Inpatient Hospital Stay: Payer: Medicare Other

## 2015-02-04 DIAGNOSIS — I481 Persistent atrial fibrillation: Secondary | ICD-10-CM

## 2015-02-04 DIAGNOSIS — J154 Pneumonia due to other streptococci: Secondary | ICD-10-CM | POA: Insufficient documentation

## 2015-02-04 LAB — CBC
HCT: 36.1 % — ABNORMAL LOW (ref 40.0–52.0)
Hemoglobin: 10.4 g/dL — ABNORMAL LOW (ref 13.0–18.0)
MCH: 28.4 pg (ref 26.0–34.0)
MCHC: 28.8 g/dL — ABNORMAL LOW (ref 32.0–36.0)
MCV: 98.5 fL (ref 80.0–100.0)
PLATELETS: 100 10*3/uL — AB (ref 150–440)
RBC: 3.67 MIL/uL — AB (ref 4.40–5.90)
RDW: 16.6 % — ABNORMAL HIGH (ref 11.5–14.5)
WBC: 197.9 10*3/uL (ref 3.8–10.6)

## 2015-02-04 LAB — COMPREHENSIVE METABOLIC PANEL
ALK PHOS: 81 U/L (ref 38–126)
ALT: 17 U/L (ref 17–63)
AST: 16 U/L (ref 15–41)
Albumin: 3.1 g/dL — ABNORMAL LOW (ref 3.5–5.0)
Anion gap: 9 (ref 5–15)
BILIRUBIN TOTAL: 0.9 mg/dL (ref 0.3–1.2)
BUN: 22 mg/dL — AB (ref 6–20)
CALCIUM: 8.7 mg/dL — AB (ref 8.9–10.3)
CO2: 26 mmol/L (ref 22–32)
CREATININE: 1.1 mg/dL (ref 0.61–1.24)
Chloride: 106 mmol/L (ref 101–111)
Glucose, Bld: 136 mg/dL — ABNORMAL HIGH (ref 65–99)
Potassium: 3.8 mmol/L (ref 3.5–5.1)
Sodium: 141 mmol/L (ref 135–145)
TOTAL PROTEIN: 6.1 g/dL — AB (ref 6.5–8.1)

## 2015-02-04 LAB — URINE CULTURE: CULTURE: NO GROWTH

## 2015-02-04 MED ORDER — IBRUTINIB 140 MG PO CAPS: 280.0000 mg | ORAL_CAPSULE | Freq: Every day | ORAL | Status: DC

## 2015-02-04 MED ORDER — ALBUTEROL SULFATE (2.5 MG/3ML) 0.083% IN NEBU
2.5000 mg | INHALATION_SOLUTION | Freq: Three times a day (TID) | RESPIRATORY_TRACT | Status: DC
  Administered 2015-02-04 – 2015-02-06 (×7): 2.5 mg via RESPIRATORY_TRACT
  Filled 2015-02-04 (×7): qty 3

## 2015-02-04 MED ORDER — ALBUTEROL SULFATE (2.5 MG/3ML) 0.083% IN NEBU
2.5000 mg | INHALATION_SOLUTION | RESPIRATORY_TRACT | Status: DC | PRN
  Administered 2015-02-06: 2.5 mg via RESPIRATORY_TRACT
  Filled 2015-02-04: qty 3

## 2015-02-04 NOTE — Progress Notes (Signed)
No new complaints No distress Rattling cough Still requiring high flow [place] O2  Filed Vitals:   02/04/15 1300 02/04/15 1400 02/04/15 1500 02/04/15 1600  BP: 142/70  138/64 117/84  Pulse: 86 89 87 83  Temp:  98.6 F (37 C)    TempSrc:  Oral    Resp: 20 25 27 30   Height:      Weight:      SpO2: 93% 91% 97% 91%   Frail, no overt distress HEENT WNL No JVD noted No wheezes, diffuse coarse rhonchi IRIR, no M noted NABS, soft Ext warm, no edema   BMP Latest Ref Rng 02/04/2015 02/03/2015 02/01/2015  Glucose 65 - 99 mg/dL 136(H) 182(H) 176(H)  BUN 6 - 20 mg/dL 22(H) 26(H) 25(H)  Creatinine 0.61 - 1.24 mg/dL 1.10 1.26(H) 1.38(H)  Sodium 135 - 145 mmol/L 141 140 136  Potassium 3.5 - 5.1 mmol/L 3.8 3.0(L) 3.4(L)  Chloride 101 - 111 mmol/L 106 105 102  CO2 22 - 32 mmol/L 26 28 24   Calcium 8.9 - 10.3 mg/dL 8.7(L) 8.4(L) 8.2(L)    CBC Latest Ref Rng 02/04/2015 02/02/2015 02/01/2015  WBC 3.8 - 10.6 K/uL 197.9(HH) 176.0(HH) 216.4(HH)  Hemoglobin 13.0 - 18.0 g/dL 10.4(L) 9.2(L) 8.8(L)  Hematocrit 40.0 - 52.0 % 36.1(L) 31.1(L) 28.0(L)  Platelets 150 - 440 K/uL 100(L) 77(L) 79(L)   Results for orders placed or performed during the hospital encounter of 01/29/2015  Blood Culture (routine x 2)     Status: None (Preliminary result)   Collection Time: 02/20/2015  5:12 PM  Result Value Ref Range Status   Specimen Description BLOOD RIGHT HAND  Final   Special Requests BOTTLES DRAWN AEROBIC AND ANAEROBIC [place]  Final   Culture NO GROWTH 4 DAYS  Final   Report Status PENDING  Incomplete  Blood Culture (routine x 2)     Status: None (Preliminary result)   Collection Time: 02/10/2015  5:13 PM  Result Value Ref Range Status   Specimen Description BLOOD RIGHT ARM  Final   Special Requests BOTTLES DRAWN AEROBIC AND ANAEROBIC [place]  Final   Culture NO GROWTH 4 DAYS  Final   Report Status PENDING  Incomplete  Culture, sputum-assessment     Status: None   Collection Time: 02/21/2015  5:20 PM  Result Value  Ref Range Status   Specimen Description SPUTUM  Final   Special Requests Immunocompromised  Final   Sputum evaluation THIS SPECIMEN IS ACCEPTABLE FOR SPUTUM CULTURE  Final   Report Status 02/08/2015 FINAL  Final  Urine culture     Status: None   Collection Time: 02/09/2015  5:20 PM  Result Value Ref Range Status   Specimen Description URINE, RANDOM  Final   Special Requests Immunocompromised  Final   Culture INSIGNIFICANT GROWTH  Final   Report Status 02/04/2015 FINAL  Final  Culture, respiratory (NON-Expectorated)     Status: None   Collection Time: 02/15/2015  5:20 PM  Result Value Ref Range Status   Specimen Description SPUTUM  Final   Special Requests Immunocompromised Reflexed from [id number]  Final   Gram Stain   Final    GOOD SPECIMEN - 80-90% WBCS MODERATE WBC SEEN MANY GRAM POSITIVE COCCI IN CHAINS IN CLUSTERS MANY GRAM NEGATIVE COCCOBACILLI    Culture   Final    HEAVY GROWTH GROUP A STREP (S.PYOGENES) ISOLATED There is no known Penicillin Resistant Beta Streptococcus in the [place] For patients that are Penicillin-allergic, Erythromycin is 85-94% susceptible, and Clindamycin is 80% susceptible.  Contact Microbiology within 7 days if sensitivity testing is  required.      Report Status 02/02/2015 FINAL  Final  MRSA PCR Screening     Status: None   Collection Time: 02/16/2015 10:47 PM  Result Value Ref Range Status   MRSA by PCR NEGATIVE NEGATIVE Final    Comment:        The GeneXpert MRSA Assay (FDA approved for NASAL specimens only), is one component of a comprehensive MRSA colonization surveillance program. It is not intended to diagnose MRSA infection nor to guide or monitor treatment for MRSA infections.   Culture, respiratory (NON-Expectorated)     Status: None (Preliminary result)   Collection Time: 02/01/15  1:40 AM  Result Value Ref Range Status   Specimen Description TRACHEAL ASPIRATE  Final   Special Requests NONE  Final   Gram Stain   Final    EXCELLENT  SPECIMEN - 90-100% WBCS MANY WBC SEEN MANY GRAM POSITIVE COCCI IN PAIRS IN CHAINS FEW GRAM NEGATIVE COCCOBACILLI    Culture   Final    RARE GROWTH GROUP A STREP (S.PYOGENES) ISOLATED There is no known Penicillin Resistant Beta Streptococcus in the [place] For patients that are Penicillin-allergic, Erythromycin is 85-94% susceptible, and Clindamycin is 80% susceptible.  Contact Microbiology within 7 days if sensitivity testing is  required.      Report Status PENDING  Incomplete  Urine culture     Status: None   Collection Time: 02/02/15  2:35 PM  Result Value Ref Range Status   Specimen Description URINE, RANDOM  Final   Special Requests Immunocompromised  Final   Culture NO GROWTH 2 DAYS  Final   Report Status 02/04/2015 FINAL  Final   Anti-infectives    Start     Dose/Rate Route Frequency Ordered Stop   02/02/15 1500  cefTRIAXone (ROCEPHIN) 2 g in dextrose 5 % 50 mL IVPB     2 g 100 mL/hr over 30 Minutes Intravenous Every 24 hours 02/02/15 1424     02/01/15 0700  vancomycin (VANCOCIN) IVPB 1000 mg/200 mL premix  Status:  Discontinued     1,000 mg 200 mL/hr over 60 Minutes Intravenous Every 12 hours 02/02/2015 2229 02/02/15 1042   02/18/2015 2245  piperacillin-tazobactam (ZOSYN) IVPB 4.5 g  Status:  Discontinued     4.5 g 25 mL/hr over 240 Minutes Intravenous 3 times per day 02/05/2015 2235 02/02/15 1424   02/06/2015 1800  vancomycin (VANCOCIN) IVPB 1000 mg/200 mL premix     1,000 mg 200 mL/hr over 60 Minutes Intravenous  Once 02/16/2015 1759 01/26/2015 2006   02/05/2015 1800  cefTRIAXone (ROCEPHIN) 1 g in dextrose 5 % 50 mL IVPB     1 g 100 mL/hr over 30 Minutes Intravenous  Once 01/25/2015 1759 02/04/2015 1952    TTE 11/11: LVEF 55 - 60%  CXR: RLL atelectasis/collapse   IMPRESSION: Acute respiratory failure RLL PNA RLL atelectasis Suspect mucus plugging New onset AF > NSR on amiodarone Hypokalemia, resolved Thrombocytopenia, improving CLL with severe leukocytosis Anxiety, controlled  on lorazepam  PLAN/REC: Cont SDU level of care Cont high flow O2 by [place] Cont ceftriaxone Chest percussion vest 11/13 Albuterol nebs 11/13 to facilitate mucociliary function Flutter valve 11/13 Cont amiodarone PO Monitor BMET intermittently Monitor I/Os Correct electrolytes as indicated DVT px: enoxaparin Monitor CBC intermittently Transfuse per usual guidelines Cont low dose lorazepam PRN  Merton Border, MD PCCM service Mobile [phone] Pager [phone]

## 2015-02-04 NOTE — Progress Notes (Signed)
[place] at [place] NAME: Dean Collins    MR#:  [id number]  DATE OF BIRTH:  [birth year]  SUBJECTIVE:  CHIEF COMPLAINT:  Patient is extubated 11/11 and on  high flow oxygen. Coughing up a lot of thick secretions. Becoming hypoxic with minimal exertion. Patient is able to do self suctioning   REVIEW OF SYSTEMS:  Review of system limited as patient is with exertional dyspnea HEENT-denies any headache or blurry vision Lungs-dyspnea with minimal exertion, positive cough Cardio-denies any chest pain GI-denies nausea vomiting or diarrhea. Denies any abdominal pain  DRUG ALLERGIES:  No Known Allergies  VITALS:  Blood pressure 139/66, pulse 67, temperature 98.6 F (37 C), temperature source Oral, resp. rate 19, height 5\' 10"  (1.778 m), weight 108.9 kg (240 lb 1.3 oz), SpO2 96 %.  PHYSICAL EXAMINATION:  GENERAL:  74 y.o.-year-old patient lying in the bed with no acute distress.  EYES: Pupils equal, round, reactive to light and accommodation. No scleral icterus. HEENT: Head atraumatic, normocephalic.  NECK:  Supple, no jugular venous distention. No thyroid enlargement, no tenderness.  LUNGS: On high flow oxygen. Moderate air entry with diffuse rhonchi and crackles. Decreased breath sounds at the right lower lobe  CARDIOVASCULAR: Irregularly irregular. No murmurs, rubs, or gallops.  ABDOMEN: Soft, nontender, nondistended. Bowel sounds present. No organomegaly or mass.  EXTREMITIES: No pedal edema, cyanosis, or clubbing.  NEUROLOGIC: Patient is awake, alert and oriented PSYCHIATRIC: The patient is with normal mood and affect SKIN: No obvious rash, lesion, or ulcer.    LABORATORY PANEL:   CBC  Recent Labs Lab 02/04/15 0457  WBC 197.9*  HGB 10.4*  HCT 36.1*  PLT 100*   ------------------------------------------------------------------------------------------------------------------  Chemistries   Recent Labs Lab 02/01/15 1446   02/04/15 0457  NA  --   < > 141  K  --   < > 3.8  CL  --   < > 106  CO2  --   < > 26  GLUCOSE  --   < > 136*  BUN  --   < > 22*  CREATININE  --   < > 1.10  CALCIUM  --   < > 8.7*  MG 1.6*  --   --   AST  --   --  16  ALT  --   --  17  ALKPHOS  --   --  81  BILITOT  --   --  0.9  < > = values in this interval not displayed. ------------------------------------------------------------------------------------------------------------------  Cardiac Enzymes  Recent Labs Lab 02/01/15 1835  TROPONINI 0.09*   ------------------------------------------------------------------------------------------------------------------  RADIOLOGY:  Dg Chest Port 1 View  02/04/2015  CLINICAL DATA:  Respiratory failure. History of prostate carcinoma. Right middle lobe pneumonia. EXAM: PORTABLE CHEST 1 VIEW COMPARISON:  02/02/2015 FINDINGS: Since prior study, the patient has been extubated. The orogastric tube has been removed. Left-sided Port-A-Cath is stable with its tip in the lower superior vena cava. There is opacity at the right base obscuring the hemidiaphragm, which has increased when compared to the prior study. This is consistent with pneumonia, atelectasis or a combination. There is mild probable atelectasis at the medial left lung base, similar to the prior exam. Lung volumes are low accentuating the bronchovascular markings. No overt pulmonary edema. IMPRESSION: 1. Increased opacity at the right lung base when compared to the previous exam. This may be due to worsening pneumonia, new or worsened atelectasis or a combination. 2. Status post extubation  and removal of the orogastric tube. No other change. Electronically Signed   By: Lajean Manes M.D.   On: 02/04/2015 07:52    EKG:   Orders placed or performed during the hospital encounter of 02/01/2015  . ED EKG 12-Lead  . ED EKG 12-Lead  . EKG 12-Lead  . EKG 12-Lead    ASSESSMENT AND PLAN:   74 year old male with past medical history of  CLL, hypothyroidism, depression, hyperlipidemia, history of prostate cancer, who presented to the hospital due to shortness of breath, weakness, sputum production and noted to be in acute respiratory failure with hypoxia.  #1 acute respiratory failure with hypoxia- secondary to pneumonia. Repeat chest x-ray reveals worsening of pneumonia versus atelectasis versus combination of both- -Extubated 11/11, on high flow oxygen since extubation day #3 -Since he is immunosuppressed given his underlying malignancy treated with broad-spectrum IV antibiotics with vancomycin, Zosyn., Changed to Rocephin and sputum is showing gram-positive cocci, group A streptococcus - blood CX NTD urine culture NTD sputum cultures with gram-positive cocci in pairs, group B strep sensitive to penicillin Appreciated pulmonology recommendations  #A. fib with RVR Well controlled with amiodarone. Discontinued  amiodarone drip and patient is started on by mouth amiodarone Appreciate cardiology recommendations-we will follow up with Dr. Fletcher Anon   # pneumonia-likely the cause of patient's acute respiratory failure with hypoxia. -Discontinued IV Zosyn and vancomycin. Patient is started on IV Rocephin and sputum is with gram-positive cocci  # history of CLL-patient's white cell count is significantly elevated. -Appreciate oncology recommendations -Sent peripheral blood flow cytometry to see  Blastic transformation -continue to  hold his chemotherapy med given his acute infectious process.  #hypothyroidism-TSH is elevated .continue Synthroid at 125 g per day. As reported by patient's daughter patient takes 125 g of Synthroid..  # hyperlipidemia-continue simvastatin.  # depression, continue Zoloft.     All the records are reviewed and case discussed with Care Management/Social Workerr. Management plans discussed with the patient's wife and she is  in agreement.  CODE STATUS: fc  TOTAL CRITICAL CARE TIME TAKING CARE  OF THIS PATIENT: 35 minutes.   POSSIBLE D/C IN 2-3  DAYS, DEPENDING ON CLINICAL CONDITION.   Nicholes Mango M.D on 02/04/2015 at 12:57 PM  Between 7am to 6pm - Pager - [phone] After 6pm go to [email or web address] - password [email or web address] [facility]  Office  [phone]  CC: Primary care physician; Idelle Crouch, MD

## 2015-02-04 NOTE — Progress Notes (Signed)
SUBJECTIVE:  Feeling better.  No chest pain.  Still SOB.    PHYSICAL EXAM Filed Vitals:   02/04/15 0800 02/04/15 0855 02/04/15 0900 02/04/15 1000  BP: 139/76  141/69 139/66  Pulse: 87  95 85  Temp:      TempSrc:      Resp: 26  24 27   Height:      Weight:      SpO2: 93% 94% 96% 93%   General:  No acute distress Lungs:  Decreased breath sounds throughtout Heart:  RRR Abdomen:  Positive bowel sounds, no rebound no guarding Extremities:  Trace edema   LABS:  Results for orders placed or performed during the hospital encounter of 01/29/2015 (from the past 24 hour(s))  Basic metabolic panel     Status: Abnormal   Collection Time: 02/03/15 11:08 AM  Result Value Ref Range   Sodium 140 135 - 145 mmol/L   Potassium 3.0 (L) 3.5 - 5.1 mmol/L   Chloride 105 101 - 111 mmol/L   CO2 28 22 - 32 mmol/L   Glucose, Bld 182 (H) 65 - 99 mg/dL   BUN 26 (H) 6 - 20 mg/dL   Creatinine, Ser 1.26 (H) 0.61 - 1.24 mg/dL   Calcium 8.4 (L) 8.9 - 10.3 mg/dL   GFR calc non Af Amer 54 (L) >60 mL/min   GFR calc Af Amer >60 >60 mL/min   Anion gap 7 5 - 15  CBC     Status: Abnormal   Collection Time: 02/04/15  4:57 AM  Result Value Ref Range   WBC 197.9 (HH) 3.8 - 10.6 K/uL   RBC 3.67 (L) 4.40 - 5.90 MIL/uL   Hemoglobin 10.4 (L) 13.0 - 18.0 g/dL   HCT 36.1 (L) 40.0 - 52.0 %   MCV 98.5 80.0 - 100.0 fL   MCH 28.4 26.0 - 34.0 pg   MCHC 28.8 (L) 32.0 - 36.0 g/dL   RDW 16.6 (H) 11.5 - 14.5 %   Platelets 100 (L) 150 - 440 K/uL  Comprehensive metabolic panel     Status: Abnormal   Collection Time: 02/04/15  4:57 AM  Result Value Ref Range   Sodium 141 135 - 145 mmol/L   Potassium 3.8 3.5 - 5.1 mmol/L   Chloride 106 101 - 111 mmol/L   CO2 26 22 - 32 mmol/L   Glucose, Bld 136 (H) 65 - 99 mg/dL   BUN 22 (H) 6 - 20 mg/dL   Creatinine, Ser 1.10 0.61 - 1.24 mg/dL   Calcium 8.7 (L) 8.9 - 10.3 mg/dL   Total Protein 6.1 (L) 6.5 - 8.1 g/dL   Albumin 3.1 (L) 3.5 - 5.0 g/dL   AST 16 15 - 41 U/L   ALT 17  17 - 63 U/L   Alkaline Phosphatase 81 38 - 126 U/L   Total Bilirubin 0.9 0.3 - 1.2 mg/dL   GFR calc non Af Amer >60 >60 mL/min   GFR calc Af Amer >60 >60 mL/min   Anion gap 9 5 - 15    Intake/Output Summary (Last 24 hours) at 02/04/15 1031 Last data filed at 02/04/15 1022  Gross per 24 hour  Intake    183 ml  Output   1110 ml  Net   -927 ml    ECHO:  - Left ventricle: The cavity size was normal. There was mild concentric hypertrophy. Systolic function was normal. The estimated ejection fraction was in the range of 55% to 60%. Wall motion was  normal; there were no regional wall motion abnormalities. The study is not technically sufficient to allow evaluation of LV diastolic function. - Left atrium: The atrium was mildly dilated. - Pulmonary arteries: Systolic pressure was mildly increased. PA peak pressure: 42 mm Hg (S).  ASSESSMENT AND PLAN:  AF RVR: Maintaining NSR.   IV amio was just stopped yesterday.  On PO for now.  Given this first episode, the higher risk for bleeding with his comorbid conditions and the self limited nature of this event I would not suggest chronic anticoagulation.     RML PNA/Acute VDRF: Per primary team and CCM  CLL:   Followed by oncology.   ELEVATED TROPONIN:  This is non specific in this situation.  Not a STEMI.  Can consider out patient stress testing in the future.    Jeneen Rinks [facility] 02/04/2015 10:31 AM

## 2015-02-04 NOTE — Evaluation (Signed)
Physical Therapy Evaluation Patient Details Name: Dean Collins MRN: [id number] DOB: 02/23/1941 Today's Date: 02/04/2015   History of Present Illness  Pt had R hip sx 6 months ago, now here with respiratory failure, was extubated 11/11  Clinical Impression  Pt did very well with exercises, mobility and ambulation during PT exam.  He is on HFNC t/o the session, but O2 sats remain in the 90s most of the time.  He has some R hip limitations with 10 minutes of exercises apart from the exam, but does well with mobility and ambulation showing good safety and relative confidence.     Follow Up Recommendations SNF;Home health PT (per pulmonary status, likely will be able to go home)    Equipment Recommendations       Recommendations for Other Services       Precautions / Restrictions Precautions Precautions: Fall Restrictions Weight Bearing Restrictions: No      Mobility  Bed Mobility Overal bed mobility: Modified Independent             General bed mobility comments: Pt needs light use of PT HHA to pull up to EOB  Transfers Overall transfer level: Modified independent Equipment used: Rolling walker (2 wheeled)             General transfer comment: Pt was able to rise to standing needing only VCs and CGA  Ambulation/Gait Ambulation/Gait assistance: Min guard Ambulation Distance (Feet): 35 Feet Assistive device: Rolling walker (2 wheeled)       General Gait Details: Pt's O2 remains 90-95% on HFNC during ambualtion and he generally shows good confidence with ambulation and walker use. No LOBs.   Stairs            Wheelchair Mobility    Modified Rankin (Stroke Patients Only)       Balance Overall balance assessment: Modified Independent                                           Pertinent Vitals/Pain Pain Assessment: No/denies pain    Home Living Family/patient expects to be discharged to:: Private residence Living Arrangements:  Spouse/significant other Available Help at Discharge: Family Type of Home: House Home Access: Stairs to enter   [employer] of Steps: 2 steps, then 3 with rail   Home Equipment: Environmental consultant - 2 wheels      Prior Function Level of Independence: Independent         Comments: 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 [employer]   Dominant Hand: Right    Extremity/Trunk Assessment   Upper Extremity Assessment: Overall WFL for tasks assessed           Lower Extremity Assessment: Overall WFL for tasks assessed (R hip is still weak, mostly with flexion/adduction)         Communication   Communication: No difficulties  Cognition Arousal/Alertness: Awake/alert Behavior During Therapy: WFL for tasks assessed/performed Overall Cognitive Status: Within Functional Limits for tasks assessed                      General Comments      Exercises General Exercises - Lower Extremity Ankle Circles/Pumps: AROM;10 reps Quad Sets: Strengthening;10 reps Gluteal Sets: Strengthening;10 reps Heel Slides: Strengthening;10 reps Hip ABduction/ADduction: AROM;Strengthening;10 reps Straight Leg Raises: AROM;10 reps  Assessment/Plan    PT Assessment Patient needs continued PT services  PT Diagnosis Difficulty walking;Generalized weakness   PT Problem List Decreased strength;Decreased activity tolerance;Decreased balance;Decreased mobility;Decreased coordination;Decreased safety awareness  PT Treatment Interventions Gait training;Stair training;Functional mobility training;Therapeutic activities;Therapeutic exercise;Balance training;Neuromuscular re-education   PT Goals (Current goals can be found in the Care Plan section) Acute Rehab PT Goals Patient Stated Goal: "I want to go home" PT Goal Formulation: With patient Time For Goal Achievement: 02/18/15 Potential to Achieve Goals: Good    Frequency Min 2X/week   Barriers  to discharge        Co-evaluation               End of Session Equipment Utilized During Treatment: Gait belt Activity Tolerance: Patient tolerated treatment well (O2 remains in the 90s most of the time on HFNC) Patient left: in bed;with nursing/sitter in room Nurse Communication: Mobility status         Time: [id number] PT Time Calculation (min) (ACUTE ONLY): 24 min   Charges:   PT Evaluation $Initial PT Evaluation Tier I: 1 Procedure PT Treatments $Therapeutic Exercise: 8-22 mins   PT G Codes:       Wayne Both, PT, DPT [phone]  Kreg Shropshire 02/04/2015, 3:46 PM

## 2015-02-05 ENCOUNTER — Inpatient Hospital Stay: Payer: Medicare Other

## 2015-02-05 DIAGNOSIS — J9690 Respiratory failure, unspecified, unspecified whether with hypoxia or hypercapnia: Secondary | ICD-10-CM | POA: Insufficient documentation

## 2015-02-05 LAB — CBC WITH DIFFERENTIAL/PLATELET
BASOS ABS: 0 10*3/uL (ref 0–0.1)
Basophils Relative: 0 %
EOS ABS: 0 10*3/uL (ref 0–0.7)
Eosinophils Relative: 0 %
HCT: 30.7 % — ABNORMAL LOW (ref 40.0–52.0)
Hemoglobin: 9.8 g/dL — ABNORMAL LOW (ref 13.0–18.0)
LYMPHS PCT: 84 %
Lymphs Abs: 121.2 10*3/uL — ABNORMAL HIGH (ref 1.0–3.6)
MCH: 30.5 pg (ref 26.0–34.0)
MCHC: 31.8 g/dL — AB (ref 32.0–36.0)
MCV: 95.9 fL (ref 80.0–100.0)
Monocytes Absolute: 4.3 10*3/uL — ABNORMAL HIGH (ref 0.2–1.0)
Monocytes Relative: 3 %
NEUTROS ABS: 18.7 10*3/uL — AB (ref 1.4–6.5)
Neutrophils Relative %: 13 %
PLATELETS: 95 10*3/uL — AB (ref 150–440)
RBC: 3.2 MIL/uL — AB (ref 4.40–5.90)
RDW: 16.5 % — ABNORMAL HIGH (ref 11.5–14.5)
WBC: 144.2 10*3/uL — AB (ref 3.8–10.6)

## 2015-02-05 LAB — BASIC METABOLIC PANEL
ANION GAP: 7 (ref 5–15)
BUN: 23 mg/dL — ABNORMAL HIGH (ref 6–20)
CALCIUM: 8.7 mg/dL — AB (ref 8.9–10.3)
CO2: 31 mmol/L (ref 22–32)
Chloride: 105 mmol/L (ref 101–111)
Creatinine, Ser: 1.02 mg/dL (ref 0.61–1.24)
GLUCOSE: 152 mg/dL — AB (ref 65–99)
Potassium: 3.5 mmol/L (ref 3.5–5.1)
SODIUM: 143 mmol/L (ref 135–145)

## 2015-02-05 LAB — CULTURE, BLOOD (ROUTINE X 2): Culture: NO GROWTH

## 2015-02-05 MED ORDER — GUAIFENESIN ER 600 MG PO TB12
600.0000 mg | ORAL_TABLET | Freq: Two times a day (BID) | ORAL | Status: DC
  Administered 2015-02-05 – 2015-02-09 (×10): 600 mg via ORAL
  Filled 2015-02-05 (×10): qty 1

## 2015-02-05 MED ORDER — ALPRAZOLAM 1 MG PO TABS
1.0000 mg | ORAL_TABLET | Freq: Three times a day (TID) | ORAL | Status: DC
  Administered 2015-02-05 – 2015-02-11 (×18): 1 mg via ORAL
  Filled 2015-02-05 (×19): qty 1

## 2015-02-05 NOTE — Care Management Note (Signed)
Case Management Note  Patient Details  Name: MONTE ZINNI MRN: [id number] Date of Birth: [birth year]  Subjective/Objective:  Met with patient to discuss discharge planning. He remains on HFNC at 65%. Patient states he prefers to go home but is open to STR if needed. PT recommendations still pending.                   Action/Plan: Provided a list of Home Health providers. Await PT recommendations.   Expected Discharge Date:                  Expected Discharge Plan:     In-House Referral:     Discharge planning Services     Post Acute Care Choice:    Choice offered to:     DME Arranged:    DME Agency:     HH Arranged:    [place] Agency:     Status of Service:     Medicare Important Message Given:    Date Medicare IM Given:    Medicare IM give by:    Date Additional Medicare IM Given:    Additional Medicare Important Message give by:     If discussed at [place] of Stay Meetings, dates discussed:    Additional Comments:  Jolly Mango, RN 02/05/2015, 11:41 AM

## 2015-02-05 NOTE — Progress Notes (Signed)
Nutrition Follow-up   INTERVENTION:   Meals and Snacks: Cater to patient preferences Medical Food Supplement Therapy: will recommend Mighty Shakes on meal trays BID for added nutrition (each shake provides 300kcals and 9g protein)   NUTRITION DIAGNOSIS:   Inadequate oral intake related to acute illness as evidenced by NPO status; improved with diet advancement  GOAL:   Patient will meet greater than or equal to 90% of their needs  MONITOR:    (Energy Intake, Anthropometrics, Digestive System, Pulmonary, Electrolyte/Renal Profile)  REASON FOR ASSESSMENT:   Consult, Ventilator Enteral/tube feeding initiation and management  ASSESSMENT:    Pt extubated on 02/02/2015, currently on HFNC. Pt having breathing treatment with RT this am on rounds.  Diet Order:  DIET DYS 3 Room service appropriate?: Yes; Fluid consistency:: Thin    Current Nutrition: Pt ate 100% of breakfast this am with 431mL of liquids observed on breakfast tray. Per documentation pt has been eating 80-100% of meals recorded.   Gastrointestinal Profile: Last BM: 02/04/2015   Scheduled Medications:  . albuterol  2.5 mg Nebulization Q8H  . ALPRAZolam  1 mg Oral TID  . amiodarone  400 mg Oral BID  . antiseptic oral rinse  7 mL Mouth Rinse QID  . cefTRIAXone (ROCEPHIN)  IV  2 g Intravenous Q24H  . chlorhexidine gluconate  15 mL Mouth Rinse BID  . enoxaparin (LOVENOX) injection  40 mg Subcutaneous Q24H  . guaiFENesin  600 mg Oral BID  . levothyroxine  125 mcg Oral QAC breakfast  . sertraline  50 mg Oral QHS  . sodium chloride  3 mL Intravenous Q12H     Electrolyte/Renal Profile and Glucose Profile:   Recent Labs Lab 02/01/15 1446 02/03/15 1108 02/04/15 0457 02/05/15 0549  NA  --  140 141 143  K  --  3.0* 3.8 3.5  CL  --  105 106 105  CO2  --  28 26 31   BUN  --  26* 22* 23*  CREATININE  --  1.26* 1.10 1.02  CALCIUM  --  8.4* 8.7* 8.7*  MG 1.6*  --   --   --   PHOS 3.7  --   --   --   GLUCOSE   --  182* 136* 152*   Protein Profile:   Recent Labs Lab 02/01/2015 1719 02/04/15 0457  ALBUMIN 3.9 3.1*      Weight Trend since Admission: Filed Weights   02/02/15 0426 02/03/15 0500 02/04/15 0459  Weight: 238 lb 5.1 oz (108.1 kg) 238 lb 1.6 oz (108 kg) 240 lb 1.3 oz (108.9 kg)     Skin:  Reviewed, no issues   BMI:  Body mass index is 34.45 kg/(m^2).  Estimated Nutritional Needs:   Kcal:  [id number] kcals (11-14 kcals/kg)   Protein:  150 g (2.0 g/kg) using IBW 75 kg  Fluid:  1875-2250 mL (25-30 ml/kg)    MODERATE Care Level  Dwyane Luo, RD, LDN Pager [phone]

## 2015-02-05 NOTE — Progress Notes (Signed)
CC: follow up SOB, hypoxia  HPI: Some SOB,  Rattling cough Still requiring high flow [place] O2 fio2 65%  Filed Vitals:   02/05/15 0900 02/05/15 0950 02/05/15 1011 02/05/15 1100  BP: 127/67   128/57  Pulse: 85  84 82  Temp:    98.4 F (36.9 C)  TempSrc:    Tympanic  Resp: 18  27 20   Height:      Weight:      SpO2: 92% 93% 93% 95%   Frail,  HEENT WNL No JVD noted No wheezes, diffuse coarse rhonchi IRIR, no M noted NABS, soft Ext warm, no edema    EXCELLENT SPECIMEN - 90-100% WBCS  MANY WBC SEEN  MANY GRAM POSITIVE COCCI IN PAIRS IN CHAINS  FEW GRAM NEGATIVE COCCOBACILLI       Culture RARE GROWTH GROUP A STREP (S.PYOGENES) ISOLATED              BMP Latest Ref Rng 02/05/2015 02/04/2015 02/03/2015  Glucose 65 - 99 mg/dL 152(H) 136(H) 182(H)  BUN 6 - 20 mg/dL 23(H) 22(H) 26(H)  Creatinine 0.61 - 1.24 mg/dL 1.02 1.10 1.26(H)  Sodium 135 - 145 mmol/L 143 141 140  Potassium 3.5 - 5.1 mmol/L 3.5 3.8 3.0(L)  Chloride 101 - 111 mmol/L 105 106 105  CO2 22 - 32 mmol/L 31 26 28   Calcium 8.9 - 10.3 mg/dL 8.7(L) 8.7(L) 8.4(L)    CBC Latest Ref Rng 02/05/2015 02/04/2015 02/02/2015  WBC 3.8 - 10.6 K/uL 144.2(HH) 197.9(HH) 176.0(HH)  Hemoglobin 13.0 - 18.0 g/dL 9.8(L) 10.4(L) 9.2(L)  Hematocrit 40.0 - 52.0 % 30.7(L) 36.1(L) 31.1(L)  Platelets 150 - 440 K/uL 95(L) 100(L) 77(L)   Results for orders placed or performed during the hospital encounter of 02/09/2015  Blood Culture (routine x 2)     Status: None (Preliminary result)   Collection Time: 01/25/2015  5:12 PM  Result Value Ref Range Status   Specimen Description BLOOD RIGHT HAND  Final   Special Requests BOTTLES DRAWN AEROBIC AND ANAEROBIC [place]  Final   Culture NO GROWTH 5 DAYS  Final   Report Status PENDING  Incomplete  Blood Culture (routine x 2)     Status: None   Collection Time: 02/09/2015  5:13 PM  Result Value Ref Range Status   Specimen Description BLOOD RIGHT ARM  Final   Special Requests BOTTLES DRAWN AEROBIC  AND ANAEROBIC [place]  Final   Culture NO GROWTH 5 DAYS  Final   Report Status 02/05/2015 FINAL  Final  Culture, sputum-assessment     Status: None   Collection Time: 02/12/2015  5:20 PM  Result Value Ref Range Status   Specimen Description SPUTUM  Final   Special Requests Immunocompromised  Final   Sputum evaluation THIS SPECIMEN IS ACCEPTABLE FOR SPUTUM CULTURE  Final   Report Status 02/02/2015 FINAL  Final  Urine culture     Status: None   Collection Time: 01/26/2015  5:20 PM  Result Value Ref Range Status   Specimen Description URINE, RANDOM  Final   Special Requests Immunocompromised  Final   Culture INSIGNIFICANT GROWTH  Final   Report Status 02/04/2015 FINAL  Final  Culture, respiratory (NON-Expectorated)     Status: None   Collection Time: 02/19/2015  5:20 PM  Result Value Ref Range Status   Specimen Description SPUTUM  Final   Special Requests Immunocompromised Reflexed from [id number]  Final   Gram Stain   Final    GOOD SPECIMEN - 80-90% WBCS MODERATE WBC SEEN  MANY GRAM POSITIVE COCCI IN CHAINS IN CLUSTERS MANY GRAM NEGATIVE COCCOBACILLI    Culture   Final    HEAVY GROWTH GROUP A STREP (S.PYOGENES) ISOLATED There is no known Penicillin Resistant Beta Streptococcus in the [place] For patients that are Penicillin-allergic, Erythromycin is 85-94% susceptible, and Clindamycin is 80% susceptible.  Contact Microbiology within 7 days if sensitivity testing is  required.      Report Status 02/02/2015 FINAL  Final  MRSA PCR Screening     Status: None   Collection Time: 02/14/2015 10:47 PM  Result Value Ref Range Status   MRSA by PCR NEGATIVE NEGATIVE Final    Comment:        The GeneXpert MRSA Assay (FDA approved for NASAL specimens only), is one component of a comprehensive MRSA colonization surveillance program. It is not intended to diagnose MRSA infection nor to guide or monitor treatment for MRSA infections.   Culture, respiratory (NON-Expectorated)     Status: None (Preliminary  result)   Collection Time: 02/01/15  1:40 AM  Result Value Ref Range Status   Specimen Description TRACHEAL ASPIRATE  Final   Special Requests NONE  Final   Gram Stain   Final    EXCELLENT SPECIMEN - 90-100% WBCS MANY WBC SEEN MANY GRAM POSITIVE COCCI IN PAIRS IN CHAINS FEW GRAM NEGATIVE COCCOBACILLI    Culture   Final    RARE GROWTH GROUP A STREP (S.PYOGENES) ISOLATED There is no known Penicillin Resistant Beta Streptococcus in the [place] For patients that are Penicillin-allergic, Erythromycin is 85-94% susceptible, and Clindamycin is 80% susceptible.  Contact Microbiology within 7 days if sensitivity testing is  required.      Report Status PENDING  Incomplete  Urine culture     Status: None   Collection Time: 02/02/15  2:35 PM  Result Value Ref Range Status   Specimen Description URINE, RANDOM  Final   Special Requests Immunocompromised  Final   Culture NO GROWTH 2 DAYS  Final   Report Status 02/04/2015 FINAL  Final   Anti-infectives    Start     Dose/Rate Route Frequency Ordered Stop   02/02/15 1500  cefTRIAXone (ROCEPHIN) 2 g in dextrose 5 % 50 mL IVPB     2 g 100 mL/hr over 30 Minutes Intravenous Every 24 hours 02/02/15 1424     02/01/15 0700  vancomycin (VANCOCIN) IVPB 1000 mg/200 mL premix  Status:  Discontinued     1,000 mg 200 mL/hr over 60 Minutes Intravenous Every 12 hours 02/12/2015 2229 02/02/15 1042   01/23/2015 2245  piperacillin-tazobactam (ZOSYN) IVPB 4.5 g  Status:  Discontinued     4.5 g 25 mL/hr over 240 Minutes Intravenous 3 times per day 02/19/2015 2235 02/02/15 1424   02/02/2015 1800  vancomycin (VANCOCIN) IVPB 1000 mg/200 mL premix     1,000 mg 200 mL/hr over 60 Minutes Intravenous  Once 02/04/2015 1759 02/03/2015 2006   02/01/2015 1800  cefTRIAXone (ROCEPHIN) 1 g in dextrose 5 % 50 mL IVPB     1 g 100 mL/hr over 30 Minutes Intravenous  Once 02/04/2015 1759 02/14/2015 1952    TTE 11/11: LVEF 55 - 60%  CXR: RLL atelectasis/collapse   IMPRESSION: Acute respiratory  failure RLL PNA RLL atelectasis Suspect mucus plugging New onset AF > NSR on amiodarone Hypokalemia, resolved Thrombocytopenia, improving CLL with severe leukocytosis   PLAN/REC: Cont SDU level of care Cont high flow O2 by  Cont ceftriaxone Chest percussion vest 11/13 Albuterol nebs 11/13 to facilitate mucociliary  function Flutter valve 11/13 Cont amiodarone PO Monitor BMET intermittently Monitor I/Os Correct electrolytes as indicated DVT px: enoxaparin Monitor CBC intermittently Transfuse per usual guidelines Xanax scheduled 1 mg TID for extreme anxiety    The Patient requires high complexity decision making for assessment and support, frequent evaluation and titration of therapies.   Corrin Parker, M.D.  [facility]  Medical Director [place] Director [facility] Cardio-Pulmonary Department

## 2015-02-05 NOTE — Progress Notes (Signed)
Dean Collins   DOB:[birth year]   [id number]    CC: CLL hyperleukocytosis/respiratory failure  Subjective: Complains of continued shortness of breath; phlegm. No hemoptysis. No nausea no vomiting or fevers.  Review of systems: Complains of swelling in the legs.   Objective:  Filed Vitals:   02/05/15 1100  BP: 128/57  Pulse: 82  Temp: 98.4 F (36.9 C)  Resp: 20     Intake/Output Summary (Last 24 hours) at 02/05/15 1554 Last data filed at 02/05/15 0900  Gross per 24 hour  Intake    530 ml  Output    550 ml  Net    -20 ml    GENERAL: Well-nourished well-developed; high flow nasal cannula oxygen. He is alone. EYES: Positive for pallor no icterus. NECK: supple, no masses felt LYMPH: no palpable lymphadenopathy in the cervical, axillary or inguinal regions LUNGS: Decreased air entry bilaterally. Bilateral coarse breath sounds. HEART/CVS: regular rate & rhythm and no murmurs; bilateral 1+ edema. ABDOMEN: abdomen soft, non-tender and normal bowel sounds Musculoskeletal:no cyanosis of digits and no clubbing  NEURO: no focal motor/sensory deficits SKIN: no rashes or significant lesions  Labs:  Lab Results  Component Value Date   WBC 144.2* 02/05/2015   HGB 9.8* 02/05/2015   HCT 30.7* 02/05/2015   MCV 95.9 02/05/2015   PLT 95* 02/05/2015   NEUTROABS 18.7* 02/05/2015    Lab Results  Component Value Date   NA 143 02/05/2015   K 3.5 02/05/2015   CL 105 02/05/2015   CO2 31 02/05/2015    Studies:  Dg Chest Port 1 View  02/05/2015  CLINICAL DATA:  Respiratory failure EXAM: PORTABLE CHEST 1 VIEW COMPARISON:  02/04/2015 FINDINGS: Moderate right effusion with right lower lobe atelectasis unchanged. Mild left lower lobe airspace disease and small left effusion slightly increased from yesterday Negative for heart failure or edema. Port-A-Cath tip in the SVC unchanged IMPRESSION: Right lower lobe atelectasis and effusion unchanged Mild progression in left lower lobe airspace  disease and small left effusion. Electronically Signed   By: Franchot Gallo M.D.   On: 02/05/2015 07:36   Dg Chest Port 1 View  02/04/2015  CLINICAL DATA:  Respiratory failure. History of prostate carcinoma. Right middle lobe pneumonia. EXAM: PORTABLE CHEST 1 VIEW COMPARISON:  02/02/2015 FINDINGS: Since prior study, the patient has been extubated. The orogastric tube has been removed. Left-sided Port-A-Cath is stable with its tip in the lower superior vena cava. There is opacity at the right base obscuring the hemidiaphragm, which has increased when compared to the prior study. This is consistent with pneumonia, atelectasis or a combination. There is mild probable atelectasis at the medial left lung base, similar to the prior exam. Lung volumes are low accentuating the bronchovascular markings. No overt pulmonary edema. IMPRESSION: 1. Increased opacity at the right lung base when compared to the previous exam. This may be due to worsening pneumonia, new or worsened atelectasis or a combination. 2. Status post extubation and removal of the orogastric tube. No other change. Electronically Signed   By: Lajean Manes M.D.   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  #CLL- hyperleukocytosis/predominant lymphocytosis- recent worsening [170s to 200s] noted secondary to ibrutinib; peaked at 300. Today white count is 144/hemoglobin stable around 9.8/thrombocytopenia again stable - 95.   #  Respiratory failure/status-unclear etiology; less likely from hyperleukocytosis/from CLL; status post extubation on high flow nasal cannula. improving  # A. Fib- currently in sinus rhythm/rate controlled on amiodarone.  Recommendations:  # Continue to hold ibrutinib. No new recommendations from  hematology/oncology standpoint. Hold off Hydrea at this time as the counts are improving since holding ibrutinib.   # I will order immunoglobulin levels-and if patient is low on immunoglobulin G- we will recommend  IVIG/for infection prophylaxis.   # If respiratory status does not improve- recommend CT scan. On antibiotics. Appreciate pulmonary's recommendations.  # discussed with Dr.Vachani.    Cammie Sickle, MD 02/05/2015  3:54 PM

## 2015-02-05 NOTE — Progress Notes (Signed)
Patient: Dean Collins / Admit Date: 01/30/2015 / Date of Encounter: 02/05/2015, 7:45 AM   Subjective: No complaints. Night was fair. Overall feeling better.   Review of Systems: Review of Systems  Constitutional: Positive for weight loss and malaise/fatigue. Negative for fever, chills and diaphoresis.  HENT: Negative for congestion.   Eyes: Negative for discharge and redness.  Respiratory: Positive for cough, sputum production, shortness of breath and wheezing. Negative for hemoptysis.   Cardiovascular: Positive for leg swelling. Negative for chest pain, palpitations, orthopnea, claudication and PND.  Gastrointestinal: Negative for nausea and vomiting.  Musculoskeletal: Negative for falls.  Skin: Negative for rash.  Neurological: Positive for weakness. Negative for dizziness, sensory change, speech change, focal weakness and loss of consciousness.  Endo/Heme/Allergies: Does not bruise/bleed easily.  Psychiatric/Behavioral: The patient is not nervous/anxious.     Objective: Telemetry: NSR, 70's Physical Exam: Blood pressure 135/67, pulse 78, temperature 98.6 F (37 C), temperature source Oral, resp. rate 20, height 5\' 10"  (1.778 m), weight 240 lb 1.3 oz (108.9 kg), SpO2 95 %. Body mass index is 34.45 kg/(m^2). General: Well developed, well nourished, in no acute distress. Head: Normocephalic, atraumatic, sclera non-icteric, no xanthomas, nares are without discharge. Neck: Negative for carotid bruits. JVP not elevated. Lungs: Decreased breath sounds throughout. Breathing is unlabored. Heart: RRR S1 S2 without murmurs, rubs, or gallops.  Abdomen: Soft, non-tender, non-distended with normoactive bowel sounds. No rebound/guarding. Extremities: No clubbing or cyanosis. Trace edema. Distal pedal pulses are 2+ and equal bilaterally. Neuro: Alert and oriented X 3. Moves all extremities spontaneously. Psych:  Responds to questions appropriately with a normal affect.   Intake/Output  Summary (Last 24 hours) at 02/05/15 0745 Last data filed at 02/05/15 0400  Gross per 24 hour  Intake     53 ml  Output    650 ml  Net   -597 ml   ECHO: - Left ventricle: The cavity size was normal. There was mild concentric hypertrophy. Systolic function was normal. The estimated ejection fraction was in the range of 55% to 60%. Wall motion was normal; there were no regional wall motion abnormalities. The study is not technically sufficient to allow evaluation of LV diastolic function. - Left atrium: The atrium was mildly dilated. - Pulmonary arteries: Systolic pressure was mildly increased. PA peak pressure: 42 mm Hg (S).  Inpatient Medications:  . albuterol  2.5 mg Nebulization Q8H  . amiodarone  400 mg Oral BID  . antiseptic oral rinse  7 mL Mouth Rinse QID  . cefTRIAXone (ROCEPHIN)  IV  2 g Intravenous Q24H  . chlorhexidine gluconate  15 mL Mouth Rinse BID  . enoxaparin (LOVENOX) injection  40 mg Subcutaneous Q24H  . levothyroxine  125 mcg Oral QAC breakfast  . sertraline  50 mg Oral QHS  . sodium chloride  3 mL Intravenous Q12H   Infusions:    Labs:  Recent Labs  02/04/15 0457 02/05/15 0549  NA 141 143  K 3.8 3.5  CL 106 105  CO2 26 31  GLUCOSE 136* 152*  BUN 22* 23*  CREATININE 1.10 1.02  CALCIUM 8.7* 8.7*    Recent Labs  02/04/15 0457  AST 16  ALT 17  ALKPHOS 81  BILITOT 0.9  PROT 6.1*  ALBUMIN 3.1*    Recent Labs  02/04/15 0457 02/05/15 0549  WBC 197.9* 144.2*  NEUTROABS  --  18.7*  HGB 10.4* 9.8*  HCT 36.1* 30.7*  MCV 98.5 95.9  PLT 100* 95*  No results for input(s): CKTOTAL, CKMB, TROPONINI in the last 72 hours. Invalid input(s): POCBNP No results for input(s): HGBA1C in the last 72 hours.   Weights: Filed Weights   02/02/15 0426 02/03/15 0500 02/04/15 0459  Weight: 238 lb 5.1 oz (108.1 kg) 238 lb 1.6 oz (108 kg) 240 lb 1.3 oz (108.9 kg)     Radiology/Studies:  Dg Abd 1 View  02/01/2015  CLINICAL DATA:   Respiratory failure EXAM: ABDOMEN - 1 VIEW COMPARISON:  02/09/2015 FINDINGS: Normal bowel gas pattern. Negative for obstruction or ileus. NG tube in the stomach. No renal calculi Right hip replacement. Surgical clips in the pelvis from prior prostatectomy. Penile prosthesis reservoir in the left lower quadrant. IMPRESSION: No acute abnormality. Electronically Signed   By: Franchot Gallo M.D.   On: 02/01/2015 09:03   Dg Abd 1 View  01/23/2015  CLINICAL DATA:  Endotracheal tube placement. EXAM: PORTABLE CHEST 1 VIEW COMPARISON:  CT scan 04/20/2014. FINDINGS: Chest x-ray: The endotracheal tube is 3 cm above the carina. The NG tube is coursing down the esophagus and into the stomach. A left subclavian power port is noted. The tip is in the mid SVC. The heart is upper limits of normal in size. Moderate elevation of the right hemidiaphragm with overlying vascular crowding and atelectasis. No edema or definite infiltrates. No large effusions. Abdomen: The NG tube tip is in the antral region of the stomach. There is scattered air in the small bowel and colon but no definite findings for obstruction or perforation. A right hip prosthesis is noted. A reservoir for a penile prosthesis is noted on the left side. Advanced degenerative changes involving the lower lumbar spine. IMPRESSION: 1. The endotracheal tube and NG tubes are in good position. 2. No significant pulmonary findings. 3. No plain film findings for an acute abdominal process. Possible mild ileus. Electronically Signed   By: Marijo Sanes M.D.   On: 01/26/2015 22:49   Dg Chest Port 1 View  02/05/2015  CLINICAL DATA:  Respiratory failure EXAM: PORTABLE CHEST 1 VIEW COMPARISON:  02/04/2015 FINDINGS: Moderate right effusion with right lower lobe atelectasis unchanged. Mild left lower lobe airspace disease and small left effusion slightly increased from yesterday Negative for heart failure or edema. Port-A-Cath tip in the SVC unchanged IMPRESSION: Right lower  lobe atelectasis and effusion unchanged Mild progression in left lower lobe airspace disease and small left effusion. Electronically Signed   By: Franchot Gallo M.D.   On: 02/05/2015 07:36   Dg Chest Port 1 View  02/04/2015  CLINICAL DATA:  Respiratory failure. History of prostate carcinoma. Right middle lobe pneumonia. EXAM: PORTABLE CHEST 1 VIEW COMPARISON:  02/02/2015 FINDINGS: Since prior study, the patient has been extubated. The orogastric tube has been removed. Left-sided Port-A-Cath is stable with its tip in the lower superior vena cava. There is opacity at the right base obscuring the hemidiaphragm, which has increased when compared to the prior study. This is consistent with pneumonia, atelectasis or a combination. There is mild probable atelectasis at the medial left lung base, similar to the prior exam. Lung volumes are low accentuating the bronchovascular markings. No overt pulmonary edema. IMPRESSION: 1. Increased opacity at the right lung base when compared to the previous exam. This may be due to worsening pneumonia, new or worsened atelectasis or a combination. 2. Status post extubation and removal of the orogastric tube. No other change. Electronically Signed   By: Lajean Manes M.D.   On: 02/04/2015 07:52  Dg Chest Port 1 View  02/02/2015  CLINICAL DATA:  Respiratory failure. EXAM: PORTABLE CHEST 1 VIEW COMPARISON:  02/01/2015. FINDINGS: Endotracheal tube, Port-A-Cath, NG tube in stable position. Stable cardiomegaly. Low lung volumes with bibasilar atelectasis. Mild infiltrate in the right lung base again cannot be excluded. No pleural effusion or pneumothorax . IMPRESSION: 1. Lines and tubes in stable position. 2.  Stable cardiomegaly. 3. Low lung volumes with stable bibasilar atelectasis. Mild infiltrate right lung base again cannot be excluded. No interim change. Electronically Signed   By: [place]   On: 02/02/2015 07:07   Dg Chest Port 1 View  02/01/2015  CLINICAL DATA:   Shortness of breath.  Respiratory failure . EXAM: PORTABLE CHEST 1 VIEW COMPARISON:  01/28/2015. FINDINGS: Endotracheal tube and NG tube stable position. Port-A-Cath in stable position. Low lung volumes with mild basilar atelectasis again noted. Mild infiltrate right lung base cannot be excluded. Stable cardiomegaly. No pleural effusion or pneumothorax . IMPRESSION: 1. Lines and tubes in stable position. 2. Stable cardiomegaly. 3. Low lung volumes with stable bibasilar atelectasis. Mild infiltrate right lung base cannot be excluded . Electronically Signed   By: Marcello Moores  Register   On: 02/01/2015 08:26   Dg Chest Port 1 View  02/14/2015  CLINICAL DATA:  Endotracheal tube placement. EXAM: PORTABLE CHEST 1 VIEW COMPARISON:  CT scan 04/20/2014. FINDINGS: Chest x-ray: The endotracheal tube is 3 cm above the carina. The NG tube is coursing down the esophagus and into the stomach. A left subclavian power port is noted. The tip is in the mid SVC. The heart is upper limits of normal in size. Moderate elevation of the right hemidiaphragm with overlying vascular crowding and atelectasis. No edema or definite infiltrates. No large effusions. Abdomen: The NG tube tip is in the antral region of the stomach. There is scattered air in the small bowel and colon but no definite findings for obstruction or perforation. A right hip prosthesis is noted. A reservoir for a penile prosthesis is noted on the left side. Advanced degenerative changes involving the lower lumbar spine. IMPRESSION: 1. The endotracheal tube and NG tubes are in good position. 2. No significant pulmonary findings. 3. No plain film findings for an acute abdominal process. Possible mild ileus. Electronically Signed   By: Marijo Sanes M.D.   On: 01/23/2015 22:49   Dg Chest Port 1 View  02/10/2015  CLINICAL DATA:  Shortness of breath and cough for 1 day EXAM: PORTABLE CHEST 1 VIEW COMPARISON:  Aug 14, 2014 FINDINGS: There is patchy infiltrate in the medial right  base. The lungs elsewhere clear. Heart is upper normal in size with pulmonary vascularity within normal limits. No adenopathy. Port-A-Cath tip is in the superior vena cava. No pneumothorax. IMPRESSION: Patchy infiltrate medial right base. Lungs elsewhere clear. No change in cardiac silhouette. Electronically Signed   By: Lowella Grip III M.D.   On: 02/03/2015 17:44     Assessment and Plan   1. AF RVR: In the setting of RML PNA. Maintaining NSR.IV amio was just stopped 11/12. On PO amiodarone 400 mg bid for now. Taper in approximately 1 week. Given this first episode, the higher risk for bleeding with his comorbid conditions and the self limited nature of this event, thus would not suggest chronic anticoagulation.   2. RML PNA/Acute VDRF: Per primary team and CCM.  3. [id number] by oncology.   4. ELEVATED TROPONIN: This is non specific in this situation. Not a STEMI. Can consider out patient  stress testing in the future.    Melvern Banker, PA-C Pager: [phone] 02/05/2015, 7:45 AM

## 2015-02-05 NOTE — Progress Notes (Signed)
   02/05/15 1300  Clinical Encounter Type  Visited With Patient;Family  Visit Type Initial  Consult/Referral To Chaplain  Spiritual Encounters  Spiritual Needs Emotional  Stress Factors  Patient Stress Factors Not reviewed  Family Stress Factors Other (Comment)  Chaplain rounded in the unit and was in the hallway when the patient's family member stepped out. Provided a compassionate presence and support. Chaplain Marylee Belzer A. Jenia Klepper Ext. [phone]

## 2015-02-06 ENCOUNTER — Inpatient Hospital Stay: Payer: Medicare Other

## 2015-02-06 DIAGNOSIS — J9690 Respiratory failure, unspecified, unspecified whether with hypoxia or hypercapnia: Secondary | ICD-10-CM

## 2015-02-06 DIAGNOSIS — I480 Paroxysmal atrial fibrillation: Secondary | ICD-10-CM | POA: Insufficient documentation

## 2015-02-06 LAB — CBC
HEMATOCRIT: 31.9 % — AB (ref 40.0–52.0)
HEMOGLOBIN: 9.8 g/dL — AB (ref 13.0–18.0)
MCH: 29.5 pg (ref 26.0–34.0)
MCHC: 30.6 g/dL — AB (ref 32.0–36.0)
MCV: 96.5 fL (ref 80.0–100.0)
Platelets: 115 10*3/uL — ABNORMAL LOW (ref 150–440)
RBC: 3.31 MIL/uL — ABNORMAL LOW (ref 4.40–5.90)
RDW: 16.2 % — AB (ref 11.5–14.5)
WBC: 118.8 10*3/uL (ref 3.8–10.6)

## 2015-02-06 LAB — CULTURE, RESPIRATORY

## 2015-02-06 LAB — BASIC METABOLIC PANEL
Anion gap: 6 (ref 5–15)
BUN: 25 mg/dL — AB (ref 6–20)
CALCIUM: 8.7 mg/dL — AB (ref 8.9–10.3)
CHLORIDE: 103 mmol/L (ref 101–111)
CO2: 30 mmol/L (ref 22–32)
CREATININE: 0.95 mg/dL (ref 0.61–1.24)
GFR calc non Af Amer: >60 mL/min (ref >60)
GLUCOSE: 138 mg/dL — AB (ref 65–99)
Potassium: 3.3 mmol/L — ABNORMAL LOW (ref 3.5–5.1)
Sodium: 139 mmol/L (ref 135–145)

## 2015-02-06 LAB — IMMUNOGLOBULINS A/E/G/M, SERUM
IGE (IMMUNOGLOBULIN E), SERUM: 1 [IU]/mL (ref 0–100)
IGG (IMMUNOGLOBIN G), SERUM: 236 mg/dL — AB (ref 700–1600)
IgA: 11 mg/dL — ABNORMAL LOW (ref 61–437)
IgM, Serum: 7 mg/dL — ABNORMAL LOW (ref 15–143)

## 2015-02-06 MED ORDER — BUDESONIDE 0.25 MG/2ML IN SUSP
0.2500 mg | Freq: Two times a day (BID) | RESPIRATORY_TRACT | Status: DC
  Administered 2015-02-06 – 2015-02-11 (×11): 0.25 mg via RESPIRATORY_TRACT
  Filled 2015-02-06 (×11): qty 2

## 2015-02-06 MED ORDER — FLUTICASONE PROPIONATE 50 MCG/ACT NA SUSP
2.0000 | Freq: Every day | NASAL | Status: DC
  Administered 2015-02-06 – 2015-02-10 (×5): 2 via NASAL
  Filled 2015-02-06: qty 16

## 2015-02-06 MED ORDER — ALBUTEROL SULFATE (2.5 MG/3ML) 0.083% IN NEBU
2.5000 mg | INHALATION_SOLUTION | RESPIRATORY_TRACT | Status: DC
  Administered 2015-02-06 – 2015-02-11 (×30): 2.5 mg via RESPIRATORY_TRACT
  Filled 2015-02-06 (×30): qty 3

## 2015-02-06 MED ORDER — POTASSIUM CHLORIDE CRYS ER 20 MEQ PO TBCR
40.0000 meq | EXTENDED_RELEASE_TABLET | Freq: Once | ORAL | Status: AC
  Administered 2015-02-06: 40 meq via ORAL
  Filled 2015-02-06: qty 2

## 2015-02-06 MED ORDER — ALBUTEROL SULFATE (2.5 MG/3ML) 0.083% IN NEBU: 2.5000 mg | INHALATION_SOLUTION | Freq: Three times a day (TID) | RESPIRATORY_TRACT | Status: DC

## 2015-02-06 NOTE — Progress Notes (Signed)
[facility] NAME: Dean Collins    MR#:  [id number]  DATE OF BIRTH:  10-Apr-1940  SUBJECTIVE:  CHIEF COMPLAINT:  Patient is extubated 11/11 and on  high flow oxygen.  He said he feels worse today, and have nasal stuffiness. He is still requiring 60% oxygen wire hyponasal cannula.  REVIEW OF SYSTEMS:   CONSTITUTIONAL: No fever, fatigue or weakness.  EYES: No blurred or double vision.  EARS, NOSE, AND THROAT: No tinnitus or ear pain. Stuffy nose. RESPIRATORY: have some cough, positive for shortness of breath, Mild wheezing, no hemoptysis.  CARDIOVASCULAR: No chest pain, orthopnea, edema.  GASTROINTESTINAL: No nausea, vomiting, diarrhea or abdominal pain.  GENITOURINARY: No dysuria, hematuria.  ENDOCRINE: No polyuria, nocturia,  HEMATOLOGY: No anemia, easy bruising or bleeding SKIN: No rash or lesion. MUSCULOSKELETAL: No joint pain or arthritis.  NEUROLOGIC: No tingling, numbness, weakness.  PSYCHIATRY: No anxiety or depression.    DRUG ALLERGIES:  No Known Allergies  VITALS:  Blood pressure 132/67, pulse 101, temperature 99 F (37.2 C), temperature source Axillary, resp. rate 22, height 5\' 10"  (1.778 m), weight 108.9 kg (240 lb 1.3 oz), SpO2 93 %.  PHYSICAL EXAMINATION:  GENERAL:  74 y.o.-year-old patient lying in the bed with no acute distress.  EYES: Pupils equal, round, reactive to light and accommodation. No scleral icterus. HEENT: Head atraumatic, normocephalic.  Wheezing sound present through his nose. NECK:  Supple, no jugular venous distention. No thyroid enlargement, no tenderness.  LUNGS: On high flow oxygen. Moderate air entry with diffuse rhonchi and crackles. Decreased breath sounds at the right lower lobe  CARDIOVASCULAR: Irregularly irregular. No murmurs, rubs, or gallops.  ABDOMEN: Soft, nontender, nondistended. Bowel sounds present. No organomegaly or mass.  EXTREMITIES: No pedal edema,  cyanosis, or clubbing.  NEUROLOGIC: Patient is awake, alert and oriented, follows commands. Moves all limbs. PSYCHIATRIC: The patient appears somewhat exhausted and disappointed by not having clinical improvement for last few days. SKIN: No obvious rash, lesion, or ulcer.    LABORATORY PANEL:   CBC  Recent Labs Lab 02/06/15 0529  WBC 118.8*  HGB 9.8*  HCT 31.9*  PLT 115*   ------------------------------------------------------------------------------------------------------------------  Chemistries   Recent Labs Lab 02/01/15 1446  02/04/15 0457  02/06/15 0529  NA  --   < > 141  < > 139  K  --   < > 3.8  < > 3.3*  CL  --   < > 106  < > 103  CO2  --   < > 26  < > 30  GLUCOSE  --   < > 136*  < > 138*  BUN  --   < > 22*  < > 25*  CREATININE  --   < > 1.10  < > 0.95  CALCIUM  --   < > 8.7*  < > 8.7*  MG 1.6*  --   --   --   --   AST  --   --  16  --   --   ALT  --   --  17  --   --   ALKPHOS  --   --  81  --   --   BILITOT  --   --  0.9  --   --   < > = values in this interval not displayed. ------------------------------------------------------------------------------------------------------------------  Cardiac Enzymes  Recent Labs Lab 02/01/15 1835  TROPONINI 0.09*   ------------------------------------------------------------------------------------------------------------------  RADIOLOGY:  Dg Chest Port 1 View  02/06/2015  CLINICAL DATA:  Right middle lobe pneumonia EXAM: PORTABLE CHEST - 1 VIEW COMPARISON:  02/05/2015 FINDINGS: Cardiac shadow remains enlarged. A left-sided chest wall port is again seen and stable. Persistent right basilar changes are noted. Small left-sided pleural effusion is noted as well. IMPRESSION: The overall appearance is stable from the prior exam. Electronically Signed   By: Inez Catalina M.D.   On: 02/06/2015 07:52   Dg Chest Port 1 View  02/05/2015  CLINICAL DATA:  Respiratory failure EXAM: PORTABLE CHEST 1 VIEW COMPARISON:   02/04/2015 FINDINGS: Moderate right effusion with right lower lobe atelectasis unchanged. Mild left lower lobe airspace disease and small left effusion slightly increased from yesterday Negative for heart failure or edema. Port-A-Cath tip in the SVC unchanged IMPRESSION: Right lower lobe atelectasis and effusion unchanged Mild progression in left lower lobe airspace disease and small left effusion. Electronically Signed   By: Franchot Gallo M.D.   On: 02/05/2015 07:36      ASSESSMENT AND PLAN:   74 year old male with past medical history of CLL, hypothyroidism, depression, hyperlipidemia, history of prostate cancer, who presented to the hospital due to shortness of breath, weakness, sputum production and noted to be in acute respiratory failure with hypoxia.  #1 acute respiratory failure with hypoxia- secondary to pneumonia. -Extubated 11/11, on high flow oxygen since extubation day #4 -Since he is immunosuppressed given his underlying malignancy treated with broad-spectrum IV antibiotics with vancomycin, Zosyn., Changed to Rocephin  - blood CX NTD urine culture NTD sputum cultures with gram-positive cocci in pairs, group B strep sensitive to penicillin Appreciated pulmonology recommendations Will get CT scan of chest with contrast today to have a better idea about his pathology. Encouraged to use Incentive spirometry and Flutter valve. Also added Flonase for nasal stuffiness.  #A. fib with RVR Well controlled with amiodarone. Discontinued  amiodarone drip and patient is started on by mouth amiodarone Appreciate cardiology recommendations-by Dr. Fletcher Anon  # pneumonia-likely the cause of patient's acute respiratory failure with hypoxia.  on IV Rocephin and sputum is with gram-positive cocci- as above  # history of CLL-patient's white cell count is significantly elevated. -Appreciate oncology recommendations -continue to  hold his chemotherapy med given his acute infectious process. - No  acute interventions.  #hypothyroidism-TSH is elevated .continue Synthroid at 125 g per day.  Need to check his TSH with PMD 1-2 weeks after recovery from the infection, as it may be high because of infection in hospital.  # hyperlipidemia-continue simvastatin.  # depression, continue Zoloft.   All the records are reviewed and case discussed with Care Management/Social Workerr. Management plans discussed with the patient's wife and she is  in agreement.  CODE STATUS: full code.  TOTAL TIME TAKING CARE OF THIS PATIENT: 35 minutes.  Discussed with his wife on phone.  POSSIBLE D/C IN 2-3  DAYS, DEPENDING ON CLINICAL CONDITION.   Vaughan Basta M.D on 02/06/2015 at 6:23 PM  Between 7am to 6pm - Pager - [phone] After 6pm go to [email or web address] - password [email or web address] [facility]  Office  [phone]  CC: Primary care physician; Idelle Crouch, MD

## 2015-02-06 NOTE — Progress Notes (Signed)
PT Attempt Note  Patient Details Name: Dean Collins MRN: [id number] DOB: July 21, 1940   Cancelled Treatment:    Reason Eval/Treat Not Completed: Medical issues which prohibited therapy. Chart reviewed and RN consulted. Attempted to work with patient however he is receiving medications upon arrival. Pt coughs excessively while drinking fluid and SaO2 drops to 70s. Therapist waited approximately 10 minutes and SaO2 never exceeds 88% on [place] at rest. Will return to attempt treatment later today if patient is appropriate. Pt currently reporting he feels unwell and RN encouraged return attempt later as SaO2 is low.  Lyndel Safe [name] PT, DPT   Gaylene Moylan 02/06/2015, 10:53 AM

## 2015-02-06 NOTE — Progress Notes (Addendum)
A&Ox4.  NSR/Stach per cardiac monitor. Strong frequent cough that is productive. Dyspnic with exertion. Tachypnic at rest at times. Tolerating diet with good appetite. Voids in urinal. Patient to go to CT tonight for CT of chest. Report given to [place], RN who is now taking over patient's care.

## 2015-02-06 NOTE — Progress Notes (Signed)
Patient: Dean Collins / Admit Date: 02/12/2015 / Date of Encounter: 02/06/2015, 9:10 AM   Subjective: On high flow nasal cannula oxygen, significant cough, sputum Has not been out of bed, feels weak, better today than yesterday, has not worked with PT  Review of Systems: Review of Systems  Respiratory: Positive for cough, sputum production and shortness of breath.   Cardiovascular: Negative.   Gastrointestinal: Negative.   Musculoskeletal: Negative.   Neurological: Positive for weakness.  Psychiatric/Behavioral: Negative.   All other systems reviewed and are negative.   Objective: Telemetry: Normal sinus rhythm Physical Exam: Blood pressure 112/53, pulse 78, temperature 98.9 F (37.2 C), temperature source Axillary, resp. rate 20, height 5\' 10"  (1.778 m), weight 240 lb 1.3 oz (108.9 kg), SpO2 92 %. Body mass index is 34.45 kg/(m^2). General: Well developed, well nourished, in no acute distress. Head: Normocephalic, atraumatic, sclera non-icteric, no xanthomas, nares are without discharge. Neck: Negative for carotid bruits. JVP not elevated. Lungs: Mildly labored breathing on high flow oxygen, diffuse rails Heart: RRR S1 S2 without murmurs, rubs, or gallops.  Abdomen: Soft, non-tender, non-distended with normoactive bowel sounds. No rebound/guarding. Extremities: No clubbing or cyanosis. No edema. Distal pedal pulses are 2+ and equal bilaterally. Neuro: Alert and oriented X 3. Moves all extremities spontaneously. Psych:  Responds to questions appropriately with a normal affect.   Intake/Output Summary (Last 24 hours) at 02/06/15 0910 Last data filed at 02/06/15 0700  Gross per 24 hour  Intake   1250 ml  Output    751 ml  Net    499 ml    Inpatient Medications:  . albuterol  2.5 mg Nebulization Q8H  . ALPRAZolam  1 mg Oral TID  . amiodarone  400 mg Oral BID  . antiseptic oral rinse  7 mL Mouth Rinse QID  . cefTRIAXone (ROCEPHIN)  IV  2 g Intravenous Q24H  .  chlorhexidine gluconate  15 mL Mouth Rinse BID  . enoxaparin (LOVENOX) injection  40 mg Subcutaneous Q24H  . guaiFENesin  600 mg Oral BID  . levothyroxine  125 mcg Oral QAC breakfast  . sertraline  50 mg Oral QHS  . sodium chloride  3 mL Intravenous Q12H   Infusions:    Labs:  Recent Labs  02/05/15 0549 02/06/15 0529  NA 143 139  K 3.5 3.3*  CL 105 103  CO2 31 30  GLUCOSE 152* 138*  BUN 23* 25*  CREATININE 1.02 0.95  CALCIUM 8.7* 8.7*    Recent Labs  02/04/15 0457  AST 16  ALT 17  ALKPHOS 81  BILITOT 0.9  PROT 6.1*  ALBUMIN 3.1*    Recent Labs  02/05/15 0549 02/06/15 0529  WBC 144.2* 118.8*  NEUTROABS 18.7*  --   HGB 9.8* 9.8*  HCT 30.7* 31.9*  MCV 95.9 96.5  PLT 95* 115*   No results for input(s): CKTOTAL, CKMB, TROPONINI in the last 72 hours. Invalid input(s): POCBNP No results for input(s): HGBA1C in the last 72 hours.   Weights: Filed Weights   02/02/15 0426 02/03/15 0500 02/04/15 0459  Weight: 238 lb 5.1 oz (108.1 kg) 238 lb 1.6 oz (108 kg) 240 lb 1.3 oz (108.9 kg)     Radiology/Studies:  Dg Abd 1 View  02/01/2015  CLINICAL DATA:  Respiratory failure EXAM: ABDOMEN - 1 VIEW COMPARISON:  02/15/2015 FINDINGS: Normal bowel gas pattern. Negative for obstruction or ileus. NG tube in the stomach. No renal calculi Right hip replacement. Surgical clips in the  pelvis from prior prostatectomy. Penile prosthesis reservoir in the left lower quadrant. IMPRESSION: No acute abnormality. Electronically Signed   By: Franchot Gallo M.D.   On: 02/01/2015 09:03   Dg Abd 1 View  01/27/2015  CLINICAL DATA:  Endotracheal tube placement. EXAM: PORTABLE CHEST 1 VIEW COMPARISON:  CT scan 04/20/2014. FINDINGS: Chest x-ray: The endotracheal tube is 3 cm above the carina. The NG tube is coursing down the esophagus and into the stomach. A left subclavian power port is noted. The tip is in the mid SVC. The heart is upper limits of normal in size. Moderate elevation of the right  hemidiaphragm with overlying vascular crowding and atelectasis. No edema or definite infiltrates. No large effusions. Abdomen: The NG tube tip is in the antral region of the stomach. There is scattered air in the small bowel and colon but no definite findings for obstruction or perforation. A right hip prosthesis is noted. A reservoir for a penile prosthesis is noted on the left side. Advanced degenerative changes involving the lower lumbar spine. IMPRESSION: 1. The endotracheal tube and NG tubes are in good position. 2. No significant pulmonary findings. 3. No plain film findings for an acute abdominal process. Possible mild ileus. Electronically Signed   By: Marijo Sanes M.D.   On: 02/04/2015 22:49   Dg Chest Port 1 View  02/06/2015  CLINICAL DATA:  Right middle lobe pneumonia EXAM: PORTABLE CHEST - 1 VIEW COMPARISON:  02/05/2015 FINDINGS: Cardiac shadow remains enlarged. A left-sided chest wall port is again seen and stable. Persistent right basilar changes are noted. Small left-sided pleural effusion is noted as well. IMPRESSION: The overall appearance is stable from the prior exam. Electronically Signed   By: Inez Catalina M.D.   On: 02/06/2015 07:52     Assessment and Plan  74 y.o. male with prior smoking history, COPD  1. AF RVR: Paroxysmal atrial fibrillation In the setting of RML PNA.  Maintaining NSR. No arrhythmia in the past 48 hours IV amio was just stopped 11/12.  On PO amiodarone 400 mg bid  --Taper in approximately 1 week.  --Not on anticoagulation at this time, will monitor closely as an inpatient We'll need outpatient follow-up, contact information provided   2. RML PNA/Acute VDRF:  Per primary team and CCM. On high flow oxygen  3. 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  White count greater than 100  4. ELEVATED TROPONIN: Minimal elevation in setting of PNA Can consider out patient stress testing in the future.   5)Hypokalemia Will replete potassium, 3.3  Signed, Esmond Plants, MD [facility] 02/06/2015, 9:10 AM

## 2015-02-06 NOTE — Progress Notes (Signed)
Patient used bedpan and with turning to get on and off bedpan oxygen sats decreased to 70% with some increased work of breathing.  o2 sats recovered to 88% but took several minutes to do so.  Patient on 80% fio2.  Patient frustrated stating" i wish i could get up to the commode."  RN explained to patient that since his oxygen saturation drops with turning in the bed then he cant get up at this time.  Patient verbalized understanding.

## 2015-02-06 NOTE — Progress Notes (Signed)
CC: follow up SOB, hypoxia  HPI: Some SOB,  Rattling cough Still requiring high flow [place] O2 fio2 65% Feels better today  Filed Vitals:   02/06/15 0400 02/06/15 0419 02/06/15 0500 02/06/15 0700  BP: 122/72  109/68 126/69  Pulse: 73  73 72  Temp:      TempSrc:      Resp: 25  29 33  Height:      Weight:      SpO2: 96% 95% 94% 95%   Frail,  HEENT WNL No JVD noted No wheezes, diffuse coarse rhonchi IRIR, no M noted NABS, soft Ext warm, no edema    EXCELLENT SPECIMEN - 90-100% WBCS  MANY WBC SEEN  MANY GRAM POSITIVE COCCI IN PAIRS IN CHAINS  FEW GRAM NEGATIVE COCCOBACILLI       Culture RARE GROWTH GROUP A STREP (S.PYOGENES) ISOLATED              BMP Latest Ref Rng 02/06/2015 02/05/2015 02/04/2015  Glucose 65 - 99 mg/dL 138(H) 152(H) 136(H)  BUN 6 - 20 mg/dL 25(H) 23(H) 22(H)  Creatinine 0.61 - 1.24 mg/dL 0.95 1.02 1.10  Sodium 135 - 145 mmol/L 139 143 141  Potassium 3.5 - 5.1 mmol/L 3.3(L) 3.5 3.8  Chloride 101 - 111 mmol/L 103 105 106  CO2 22 - 32 mmol/L 30 31 26   Calcium 8.9 - 10.3 mg/dL 8.7(L) 8.7(L) 8.7(L)    CBC Latest Ref Rng 02/06/2015 02/05/2015 02/04/2015  WBC 3.8 - 10.6 K/uL 118.8(HH) 144.2(HH) 197.9(HH)  Hemoglobin 13.0 - 18.0 g/dL 9.8(L) 9.8(L) 10.4(L)  Hematocrit 40.0 - 52.0 % 31.9(L) 30.7(L) 36.1(L)  Platelets 150 - 440 K/uL 115(L) 95(L) 100(L)   Results for orders placed or performed during the hospital encounter of 02/06/2015  Blood Culture (routine x 2)     Status: None (Preliminary result)   Collection Time: 02/05/2015  5:12 PM  Result Value Ref Range Status   Specimen Description BLOOD RIGHT HAND  Final   Special Requests BOTTLES DRAWN AEROBIC AND ANAEROBIC [place]  Final   Culture NO GROWTH 5 DAYS  Final   Report Status PENDING  Incomplete  Blood Culture (routine x 2)     Status: None   Collection Time: 02/04/2015  5:13 PM  Result Value Ref Range Status   Specimen Description BLOOD RIGHT ARM  Final   Special Requests BOTTLES DRAWN AEROBIC AND  ANAEROBIC [place]  Final   Culture NO GROWTH 5 DAYS  Final   Report Status 02/05/2015 FINAL  Final  Culture, sputum-assessment     Status: None   Collection Time: 02/06/2015  5:20 PM  Result Value Ref Range Status   Specimen Description SPUTUM  Final   Special Requests Immunocompromised  Final   Sputum evaluation THIS SPECIMEN IS ACCEPTABLE FOR SPUTUM CULTURE  Final   Report Status 01/25/2015 FINAL  Final  Urine culture     Status: None   Collection Time: 02/20/2015  5:20 PM  Result Value Ref Range Status   Specimen Description URINE, RANDOM  Final   Special Requests Immunocompromised  Final   Culture INSIGNIFICANT GROWTH  Final   Report Status 02/04/2015 FINAL  Final  Culture, respiratory (NON-Expectorated)     Status: None   Collection Time: 02/20/2015  5:20 PM  Result Value Ref Range Status   Specimen Description SPUTUM  Final   Special Requests Immunocompromised Reflexed from [id number]  Final   Gram Stain   Final    GOOD SPECIMEN - 80-90% WBCS MODERATE WBC SEEN  MANY GRAM POSITIVE COCCI IN CHAINS IN CLUSTERS MANY GRAM NEGATIVE COCCOBACILLI    Culture   Final    HEAVY GROWTH GROUP A STREP (S.PYOGENES) ISOLATED There is no known Penicillin Resistant Beta Streptococcus in the [place] For patients that are Penicillin-allergic, Erythromycin is 85-94% susceptible, and Clindamycin is 80% susceptible.  Contact Microbiology within 7 days if sensitivity testing is  required.      Report Status 02/02/2015 FINAL  Final  MRSA PCR Screening     Status: None   Collection Time: 02/17/2015 10:47 PM  Result Value Ref Range Status   MRSA by PCR NEGATIVE NEGATIVE Final    Comment:        The GeneXpert MRSA Assay (FDA approved for NASAL specimens only), is one component of a comprehensive MRSA colonization surveillance program. It is not intended to diagnose MRSA infection nor to guide or monitor treatment for MRSA infections.   Culture, respiratory (NON-Expectorated)     Status: None (Preliminary  result)   Collection Time: 02/01/15  1:40 AM  Result Value Ref Range Status   Specimen Description TRACHEAL ASPIRATE  Final   Special Requests NONE  Final   Gram Stain   Final    EXCELLENT SPECIMEN - 90-100% WBCS MANY WBC SEEN MANY GRAM POSITIVE COCCI IN PAIRS IN CHAINS FEW GRAM NEGATIVE COCCOBACILLI    Culture   Final    RARE GROWTH GROUP A STREP (S.PYOGENES) ISOLATED There is no known Penicillin Resistant Beta Streptococcus in the [place] For patients that are Penicillin-allergic, Erythromycin is 85-94% susceptible, and Clindamycin is 80% susceptible.  Contact Microbiology within 7 days if sensitivity testing is  required.      Report Status PENDING  Incomplete  Urine culture     Status: None   Collection Time: 02/02/15  2:35 PM  Result Value Ref Range Status   Specimen Description URINE, RANDOM  Final   Special Requests Immunocompromised  Final   Culture NO GROWTH 2 DAYS  Final   Report Status 02/04/2015 FINAL  Final   Anti-infectives    Start     Dose/Rate Route Frequency Ordered Stop   02/02/15 1500  cefTRIAXone (ROCEPHIN) 2 g in dextrose 5 % 50 mL IVPB     2 g 100 mL/hr over 30 Minutes Intravenous Every 24 hours 02/02/15 1424     02/01/15 0700  vancomycin (VANCOCIN) IVPB 1000 mg/200 mL premix  Status:  Discontinued     1,000 mg 200 mL/hr over 60 Minutes Intravenous Every 12 hours 02/02/2015 2229 02/02/15 1042   02/03/2015 2245  piperacillin-tazobactam (ZOSYN) IVPB 4.5 g  Status:  Discontinued     4.5 g 25 mL/hr over 240 Minutes Intravenous 3 times per day 02/03/2015 2235 02/02/15 1424   02/10/2015 1800  vancomycin (VANCOCIN) IVPB 1000 mg/200 mL premix     1,000 mg 200 mL/hr over 60 Minutes Intravenous  Once 01/29/2015 1759 02/10/2015 2006   02/09/2015 1800  cefTRIAXone (ROCEPHIN) 1 g in dextrose 5 % 50 mL IVPB     1 g 100 mL/hr over 30 Minutes Intravenous  Once 02/04/2015 1759 02/06/2015 1952    TTE 11/11: LVEF 55 - 60%  CXR: RLL atelectasis/collapse   IMPRESSION: Acute respiratory  failure RLL PNA RLL atelectasis New onset AF > NSR on amiodarone Hypokalemia, resolved Thrombocytopenia, improving CLL with severe leukocytosis   PLAN/REC: Cont SDU level of care Cont high flow O2 by [place] fio2 as tolerated Cont ceftriaxone for strep pyogens pneumonia Chest percussion as tolerated Albuterol nebs  11/13 to facilitate mucociliary function Flutter valve 11/13 Cont amiodarone PO Monitor BMET intermittently Monitor I/Os Correct electrolytes as indicated DVT px: enoxaparin Monitor CBC intermittently Transfuse per usual guidelines Xanax scheduled 1 mg TID for extreme anxiety-seems to be working well    The Patient requires high complexity decision making for assessment and support, frequent evaluation and titration of therapies.   Corrin Parker, M.D.  [facility]  Medical Director [place] Director [facility] Cardio-Pulmonary Department

## 2015-02-06 NOTE — NC FL2 (Signed)
[place] LEVEL OF CARE SCREENING TOOL     IDENTIFICATION  Patient Name: Dean Collins Birthdate: 10-11-1940 Sex: male Admission Date (Current Location): 02/12/2015  [facility] [place] Number: Engineering geologist and Address:  [facility], [address] [place], [place] [address]      Provider Number: [id number]  Attending Physician Name and Address:  Vaughan Basta, MD  Relative Name and Phone Number:       Current Level of Care: Hospital Recommended Level of Care: [place] Prior Approval Number:    Date Approved/Denied:   PASRR Number:    Discharge Plan: SNF    Current Diagnoses: Patient Active Problem List   Diagnosis Date Noted  . Paroxysmal atrial fibrillation (HCC)   . Respiratory failure ([place])   . Pneumonia due to streptococcus, group A ([place])   . Atrial fibrillation ([place]) 02/01/2015  . Right middle lobe pneumonia 02/01/2015  . Hypothyroidism 02/01/2015  . Acute respiratory failure with hypoxia ([place]) 02/03/2015  . CLL (chronic lymphocytic leukemia) ([place]) 08/30/2014  . Femoral neck fracture ([place]) 08/11/2014  . Lymphoma ([place]) 11/11/2013  . Splenomegaly 11/11/2013  . Lymphadenopathy of head and neck 11/11/2013    Orientation ACTIVITIES/SOCIAL BLADDER RESPIRATION    Self, Time, Situation, Place  Active Continent  (O2 continuous)  BEHAVIORAL SYMPTOMS/MOOD NEUROLOGICAL BOWEL NUTRITION STATUS   (none)  (none) Continent Diet  PHYSICIAN VISITS COMMUNICATION OF NEEDS Height & Weight Skin  30 days Verbally   240 lbs. Normal          AMBULATORY STATUS RESPIRATION    Assist extensive  (O2 continuous)      Personal Care Assistance Level of Assistance  Bathing, Dressing Bathing Assistance: Maximum assistance   Dressing Assistance: Maximum assistance      Functional Limitations Info   (none)             SPECIAL CARE FACTORS FREQUENCY  PT (By licensed PT)                    Additional Factors Info  Code Status, Allergies Code Status Info: Full Allergies Info: nka           Current Medications (02/06/2015): Current Facility-Administered Medications  Medication Dose Route Frequency Provider Last Rate Last Dose  . acetaminophen (TYLENOL) tablet 650 mg  650 mg Oral Q6H PRN Wilhelmina Mcardle, MD      . albuterol (PROVENTIL) (2.5 MG/3ML) 0.083% nebulizer solution 2.5 mg  2.5 mg Nebulization Q4H PRN Wilhelmina Mcardle, MD      . albuterol (PROVENTIL) (2.5 MG/3ML) 0.083% nebulizer solution 2.5 mg  2.5 mg Nebulization Q4H Flora Lipps, MD   2.5 mg at 02/06/15 1117  . ALPRAZolam Duanne Moron) tablet 1 mg  1 mg Oral TID Flora Lipps, MD   1 mg at 02/06/15 0913  . amiodarone (PACERONE) tablet 400 mg  400 mg Oral BID Wilhelmina Mcardle, MD   400 mg at 02/06/15 0913  . antiseptic oral rinse solution (CORINZ)  7 mL Mouth Rinse QID Lance Coon, MD   7 mL at 02/06/15 0400  . budesonide (PULMICORT) nebulizer solution 0.25 mg  0.25 mg Nebulization BID Flora Lipps, MD   0.25 mg at 02/06/15 1118  . cefTRIAXone (ROCEPHIN) 2 g in dextrose 5 % 50 mL IVPB  2 g Intravenous Q24H Flora Lipps, MD   2 g at 02/06/15 1435  . chlorhexidine gluconate (PERIDEX) 0.12 % solution 15 mL  15 mL Mouth  Rinse BID Lance Coon, MD   15 mL at 02/06/15 1200  . enoxaparin (LOVENOX) injection 40 mg  40 mg Subcutaneous Q24H Wilhelmina Mcardle, MD   40 mg at 02/05/15 2353  . guaiFENesin (MUCINEX) 12 hr tablet 600 mg  600 mg Oral BID Flora Lipps, MD   600 mg at 02/06/15 0912  . levothyroxine (SYNTHROID, LEVOTHROID) tablet 125 mcg  125 mcg Oral QAC breakfast Nicholes Mango, MD   125 mcg at 02/06/15 0911  . LORazepam (ATIVAN) injection 0.5-1 mg  0.5-1 mg Intravenous Q4H PRN Juanito Doom, MD   1 mg at 02/04/15 2153  . magic mouthwash  10 mL Oral QID PRN Nicholes Mango, MD   10 mL at 02/03/15 1639  . metoprolol (LOPRESSOR) injection 2.5-5 mg  2.5-5 mg Intravenous Q3H PRN Wilhelmina Mcardle, MD      . morphine 2 MG/ML injection 1-3  mg  1-3 mg Intravenous Q3H PRN Juanito Doom, MD   2 mg at 02/04/15 0058  . ondansetron (ZOFRAN) injection 4 mg  4 mg Intravenous Q6H PRN Henreitta Leber, MD      . sertraline (ZOLOFT) tablet 50 mg  50 mg Oral QHS Wilhelmina Mcardle, MD   50 mg at 02/05/15 2133  . sodium chloride 0.9 % injection 3 mL  3 mL Intravenous Q12H Henreitta Leber, MD   3 mL at 02/06/15 [id number]   Do not use this list as official medication orders. Please verify with discharge summary.  Discharge Medications:   Medication List    ASK your doctor about these medications        cetirizine 10 MG tablet  Commonly known as:  ZYRTEC  Take 10 mg by mouth daily.     fluticasone 50 MCG/ACT nasal spray  Commonly known as:  FLONASE  SPRAY TWICE IN EACH NOSTRIL ONCE DAILY     furosemide 20 MG tablet  Commonly known as:  LASIX  Take by mouth.     HYDROcodone-acetaminophen 5-325 MG tablet  Commonly known as:  NORCO/VICODIN  Take 1 tablet by mouth every 6 (six) hours as needed for moderate pain or severe pain.     ibrutinib 140 MG capsul  Commonly known as:  IMBRUVICA  Take 2 capsules (280 mg total) by mouth daily.     levothyroxine 75 MCG tablet  Commonly known as:  SYNTHROID, LEVOTHROID  Take 75 mcg by mouth daily before breakfast.     levothyroxine 125 MCG tablet  Commonly known as:  SYNTHROID, LEVOTHROID  Take 125 mcg by mouth daily.     lidocaine-prilocaine cream  Commonly known as:  EMLA  Apply cream 1 hour before chemotherapy treatment     methocarbamol 500 MG tablet  Commonly known as:  ROBAXIN  Take 500 mg by mouth 2 (two) times daily.  Ask about: Should I take this medication?     potassium chloride 10 MEQ CR capsule  Commonly known as:  MICRO-K  Take by mouth.     sertraline 50 MG tablet  Commonly known as:  ZOLOFT  Take 50 mg by mouth at bedtime.     simvastatin 40 MG tablet  Commonly known as:  ZOCOR  Take 40 mg by mouth daily at 6 PM.     Vitamin D3 2000 UNITS capsule  Take by mouth.      zolpidem 5 MG tablet  Commonly known as:  AMBIEN  Take 5 mg by mouth at bedtime.  Relevant Imaging Results:  Relevant Lab Results:  Recent Labs    Additional Information SS: [id number]  Shela Leff, LCSW

## 2015-02-06 NOTE — Progress Notes (Signed)
PT Cancellation Note  Patient Details Name: Dean Collins MRN: [id number] DOB: 1940/10/04   Cancelled Treatment:    Reason Eval/Treat Not Completed: Medical issues which prohibited therapy. Return attempt made for PT with patient. SaO2 still in the 70's and pt feeling unwell. Not appropriate for therapy currently. Will continue to attempt as pt is appropraite and time permits.  Lyndel Safe Violette Morneault PT, DPT   Trini Soldo 02/06/2015, 12:33 PM

## 2015-02-06 NOTE — Progress Notes (Signed)
[facility] [facility] [place] NAME: Dean Collins    MR#:  [id number]  DATE OF BIRTH:  20-Aug-1940  SUBJECTIVE:  CHIEF COMPLAINT:  Patient is extubated 11/11 and on  high flow oxygen.   said feels little better day by day, less secretions, still on 60% FIo2.  REVIEW OF SYSTEMS:   CONSTITUTIONAL: No fever, fatigue or weakness.  EYES: No blurred or double vision.  EARS, NOSE, AND THROAT: No tinnitus or ear pain.  RESPIRATORY: have some cough, positive for shortness of breath, Mild wheezing, no hemoptysis.  CARDIOVASCULAR: No chest pain, orthopnea, edema.  GASTROINTESTINAL: No nausea, vomiting, diarrhea or abdominal pain.  GENITOURINARY: No dysuria, hematuria.  ENDOCRINE: No polyuria, nocturia,  HEMATOLOGY: No anemia, easy bruising or bleeding SKIN: No rash or lesion. MUSCULOSKELETAL: No joint pain or arthritis.  NEUROLOGIC: No tingling, numbness, weakness.  PSYCHIATRY: No anxiety or depression.    DRUG ALLERGIES:  No Known Allergies  VITALS:  Blood pressure 109/68, pulse 73, temperature 98.9 F (37.2 C), temperature source Axillary, resp. rate 29, height 5\' 10"  (1.778 m), weight 108.9 kg (240 lb 1.3 oz), SpO2 94 %.  PHYSICAL EXAMINATION:  GENERAL:  74 y.o.-year-old patient lying in the bed with no acute distress.  EYES: Pupils equal, round, reactive to light and accommodation. No scleral icterus. HEENT: Head atraumatic, normocephalic.  NECK:  Supple, no jugular venous distention. No thyroid enlargement, no tenderness.  LUNGS: On high flow oxygen. Moderate air entry with diffuse rhonchi and crackles. Decreased breath sounds at the right lower lobe  CARDIOVASCULAR: Irregularly irregular. No murmurs, rubs, or gallops.  ABDOMEN: Soft, nontender, nondistended. Bowel sounds present. No organomegaly or mass.  EXTREMITIES: No pedal edema, cyanosis, or clubbing.  NEUROLOGIC: Patient is awake, alert and oriented, follows simple  commands. Moves all limbs. PSYCHIATRIC: The patient is with normal mood and affect SKIN: No obvious rash, lesion, or ulcer.    LABORATORY PANEL:   CBC  Recent Labs Lab 02/06/15 0529  WBC 118.8*  HGB 9.8*  HCT 31.9*  PLT 115*   ------------------------------------------------------------------------------------------------------------------  Chemistries   Recent Labs Lab 02/01/15 1446  02/04/15 0457  02/06/15 0529  NA  --   < > 141  < > 139  K  --   < > 3.8  < > 3.3*  CL  --   < > 106  < > 103  CO2  --   < > 26  < > 30  GLUCOSE  --   < > 136*  < > 138*  BUN  --   < > 22*  < > 25*  CREATININE  --   < > 1.10  < > 0.95  CALCIUM  --   < > 8.7*  < > 8.7*  MG 1.6*  --   --   --   --   AST  --   --  16  --   --   ALT  --   --  17  --   --   ALKPHOS  --   --  81  --   --   BILITOT  --   --  0.9  --   --   < > = values in this interval not displayed. ------------------------------------------------------------------------------------------------------------------  Cardiac Enzymes  Recent Labs Lab 02/01/15 1835  TROPONINI 0.09*   ------------------------------------------------------------------------------------------------------------------  RADIOLOGY:  Dg Chest Port 1 View  02/05/2015  CLINICAL DATA:  Respiratory failure EXAM: PORTABLE CHEST 1 VIEW  COMPARISON:  02/04/2015 FINDINGS: Moderate right effusion with right lower lobe atelectasis unchanged. Mild left lower lobe airspace disease and small left effusion slightly increased from yesterday Negative for heart failure or edema. Port-A-Cath tip in the SVC unchanged IMPRESSION: Right lower lobe atelectasis and effusion unchanged Mild progression in left lower lobe airspace disease and small left effusion. Electronically Signed   By: Franchot Gallo M.D.   On: 02/05/2015 07:36      ASSESSMENT AND PLAN:   74 year old male with past medical history of CLL, hypothyroidism, depression, hyperlipidemia, history of  prostate cancer, who presented to the hospital due to shortness of breath, weakness, sputum production and noted to be in acute respiratory failure with hypoxia.  #1 acute respiratory failure with hypoxia- secondary to pneumonia. Repeat chest x-ray reveals worsening of pneumonia versus atelectasis versus combination of both- -Extubated 11/11, on high flow oxygen since extubation day #4 -Since he is immunosuppressed given his underlying malignancy treated with broad-spectrum IV antibiotics with vancomycin, Zosyn., Changed to Rocephin and sputum is showing gram-positive cocci, group A streptococcus - blood CX NTD urine culture NTD sputum cultures with gram-positive cocci in pairs, group B strep sensitive to penicillin Appreciated pulmonology recommendations If does not improve- may get CT chest. Encouraged to use Incentive spirometry and Flutter valve.  #A. fib with RVR Well controlled with amiodarone. Discontinued  amiodarone drip and patient is started on by mouth amiodarone Appreciate cardiology recommendations-by Dr. Fletcher Anon   # pneumonia-likely the cause of patient's acute respiratory failure with hypoxia. -Discontinued IV Zosyn and vancomycin. Patient is started on IV Rocephin and sputum is with gram-positive cocci  # history of CLL-patient's white cell count is significantly elevated. -Appreciate oncology recommendations -Sent peripheral blood flow cytometry to see  Blastic transformation -continue to  hold his chemotherapy med given his acute infectious process.  #hypothyroidism-TSH is elevated .continue Synthroid at 125 g per day.  Need to check his TSH with PMD 1-2 weeks after recovery from the infection.  # hyperlipidemia-continue simvastatin.  # depression, continue Zoloft.   All the records are reviewed and case discussed with Care Management/Social Workerr. Management plans discussed with the patient's wife and she is  in agreement.  CODE STATUS: full code.  TOTAL  TIME TAKING CARE OF THIS PATIENT: 75 minutes.  Discussed with his wife in room and a friend of his present in room.  POSSIBLE D/C IN 2-3  DAYS, DEPENDING ON CLINICAL CONDITION.   Vaughan Basta M.D on 02/06/2015 at 7:46 AM  Between 7am to 6pm - Pager - [phone] After 6pm go to [email or web address] - password [email or web address] [facility]  Office  [phone]  CC: Primary care physician; Idelle Crouch, MD

## 2015-02-06 NOTE — Clinical Social Work Note (Signed)
Clinical Social Work Assessment  Patient Details  Name: Dean Collins MRN: [id number] Date of Birth: 08-Mar-1941  Date of referral:  02/06/15               Reason for consult:  Facility Placement                Permission sought to share information with:    Permission granted to share information::     Name::        Agency::     Relationship::     Contact Information:     Housing/Transportation Living arrangements for the past 2 months:  Single Family Home Source of Information:  Patient Patient Interpreter Needed:  None Criminal Activity/Legal Involvement Pertinent to Current Situation/Hospitalization:    Significant Relationships:  Spouse Lives with:  Spouse Do you feel safe going back to the place where you live?  Yes Need for family participation in patient care:  Yes (Comment)  Care giving concerns:  None  Facilities manager / plan:  CSW informed by RN CM that the PT recommendation for patient is between returning home with home health or going to STR. CSW spoke with patient this afternoon briefly as patient was on high flow nasal cannula and having difficulty breathing. RN CM had informed CSW that earlier patient was willing to consider rehab if needed but preferred to go home.   CSW met with patient this afternoon and he initially stated he would only be willing to do rehab if it was the kind of rehab he could do in his home. CSW asked if he had anyone in the home to assist and he stated he lives with his wife. CSW asked if he would give me permission to initiate a bedsearch as a plan B. Patient gave CSW permission.  Employment status:  Retired Nurse, adult PT Recommendations:  [place] / Referral to community resources:  [place]  Patient/Family's Response to care:  Patient calm and pleasant but was having difficulty breathing.  Patient/Family's Understanding of and Emotional Response to  Diagnosis, Current Treatment, and Prognosis:  Patient considering rehab but prefers to return home.  Emotional Assessment Appearance:  Appears stated age Attitude/Demeanor/Rapport:   (pleasant and cooperative) Affect (typically observed):  Calm, Pleasant Orientation:  Oriented to Self, Oriented to Place, Oriented to  Time, Oriented to Situation Alcohol / Substance use:  Not Applicable Psych involvement (Current and /or in the community):  No (Comment)  Discharge Needs  Concerns to be addressed:  Care Coordination Readmission within the last 30 days:  No Current discharge risk:  None Barriers to Discharge:  No Barriers Identified   Shela Leff, LCSW 02/06/2015, 3:01 PM

## 2015-02-07 ENCOUNTER — Inpatient Hospital Stay: Payer: Medicare Other

## 2015-02-07 DIAGNOSIS — R778 Other specified abnormalities of plasma proteins: Secondary | ICD-10-CM | POA: Diagnosis present

## 2015-02-07 DIAGNOSIS — R7989 Other specified abnormal findings of blood chemistry: Secondary | ICD-10-CM | POA: Diagnosis present

## 2015-02-07 LAB — BASIC METABOLIC PANEL
Anion gap: 6 (ref 5–15)
BUN: 24 mg/dL — ABNORMAL HIGH (ref 6–20)
CO2: 30 mmol/L (ref 22–32)
Calcium: 8.6 mg/dL — ABNORMAL LOW (ref 8.9–10.3)
Chloride: 104 mmol/L (ref 101–111)
Creatinine, Ser: 1.05 mg/dL (ref 0.61–1.24)
GFR calc Af Amer: >60 mL/min (ref >60)
GFR calc non Af Amer: >60 mL/min (ref >60)
Glucose, Bld: 152 mg/dL — ABNORMAL HIGH (ref 65–99)
Potassium: 3.7 mmol/L (ref 3.5–5.1)
Sodium: 140 mmol/L (ref 135–145)

## 2015-02-07 LAB — CBC
HCT: 29.9 % — ABNORMAL LOW (ref 40.0–52.0)
Hemoglobin: 9 g/dL — ABNORMAL LOW (ref 13.0–18.0)
MCH: 28.8 pg (ref 26.0–34.0)
MCHC: 30.1 g/dL — ABNORMAL LOW (ref 32.0–36.0)
MCV: 95.6 fL (ref 80.0–100.0)
Platelets: 126 10*3/uL — ABNORMAL LOW (ref 150–440)
RBC: 3.13 MIL/uL — ABNORMAL LOW (ref 4.40–5.90)
RDW: 15.2 % — ABNORMAL HIGH (ref 11.5–14.5)
WBC: 112 10*3/uL (ref 3.8–10.6)

## 2015-02-07 MED ORDER — FUROSEMIDE 10 MG/ML IJ SOLN
40.0000 mg | Freq: Once | INTRAMUSCULAR | Status: AC
  Administered 2015-02-07: 40 mg via INTRAVENOUS
  Filled 2015-02-07: qty 4

## 2015-02-07 MED ORDER — METHYLPREDNISOLONE SODIUM SUCC 40 MG IJ SOLR
40.0000 mg | Freq: Two times a day (BID) | INTRAMUSCULAR | Status: DC
  Administered 2015-02-07 – 2015-02-09 (×4): 40 mg via INTRAVENOUS
  Filled 2015-02-07 (×5): qty 1

## 2015-02-07 NOTE — Progress Notes (Signed)
Pt very SOB and tachypnic at times first couple hours of shift, pt also had frequent spells of coughing which produced thick tan mucus.  Pt also unable to lay flat due to becoming SOB and coughing.  After talking with RT and CT this RN made the decision not to take pt to CT at this time.  Spoke with pts wife who was updated on pt status and decision not to take pt at this time, she was in agreeance with this. After pt informed of not going to CT at this time pt rested well.

## 2015-02-07 NOTE — Progress Notes (Signed)
[place] [facility] [place] NAME: Dean Collins    MR#:  [id number]  DATE OF BIRTH:  [birth year]  SUBJECTIVE:  CHIEF COMPLAINT:  Patient is extubated 11/11 and on  Still on high flow oxygen.  SOB but no wheezing.  REVIEW OF SYSTEMS:   CONSTITUTIONAL: No fever, has weakness.  EYES: No blurred or double vision.  EARS, NOSE, AND THROAT: No tinnitus or ear pain. Stuffy nose. RESPIRATORY: has cough and shortness of breath, no wheezing, no hemoptysis.  CARDIOVASCULAR: No chest pain, orthopnea, edema.  GASTROINTESTINAL: No nausea, vomiting, diarrhea or abdominal pain.  GENITOURINARY: No dysuria, hematuria.  ENDOCRINE: No polyuria, nocturia,  HEMATOLOGY: No anemia, easy bruising or bleeding SKIN: No rash or lesion. MUSCULOSKELETAL: No joint pain or arthritis.  NEUROLOGIC: No tingling, numbness, weakness.  PSYCHIATRY: No anxiety or depression.    DRUG ALLERGIES:  No Known Allergies  VITALS:  Blood pressure 138/73, pulse 72, temperature 98 F (36.7 C), temperature source Oral, resp. rate 21, height 5\' 10"  (1.778 m), weight 108.9 kg (240 lb 1.3 oz), SpO2 98 %.  PHYSICAL EXAMINATION:  GENERAL:  74 y.o.-year-old patient lying in the bed with no acute distress.  EYES: Pupils equal, round, reactive to light and accommodation. No scleral icterus. HEENT: Head atraumatic, normocephalic.  Wheezing sound present through his nose. NECK:  Supple, no jugular venous distention. No thyroid enlargement, no tenderness.  LUNGS: On high flow oxygen. Moderate air entry with diffuse rhonchi and crackles. Decreased breath sounds at the right lower lobe  CARDIOVASCULAR: Irregularly irregular. No murmurs, rubs, or gallops.  ABDOMEN: Soft, nontender, nondistended. Bowel sounds present. No organomegaly or mass.  EXTREMITIES: No pedal edema, cyanosis, or clubbing.  NEUROLOGIC: Patient is awake, alert and oriented, follows commands. Moves all  limbs. PSYCHIATRIC: alert and awake, quiet. SKIN: No obvious rash, lesion, or ulcer.    LABORATORY PANEL:   CBC  Recent Labs Lab 02/07/15 0515  WBC 112.0*  HGB 9.0*  HCT 29.9*  PLT 126*   ------------------------------------------------------------------------------------------------------------------  Chemistries   Recent Labs Lab 02/01/15 1446  02/04/15 0457  02/07/15 0515  NA  --   < > 141  < > 140  K  --   < > 3.8  < > 3.7  CL  --   < > 106  < > 104  CO2  --   < > 26  < > 30  GLUCOSE  --   < > 136*  < > 152*  BUN  --   < > 22*  < > 24*  CREATININE  --   < > 1.10  < > 1.05  CALCIUM  --   < > 8.7*  < > 8.6*  MG 1.6*  --   --   --   --   AST  --   --  16  --   --   ALT  --   --  17  --   --   ALKPHOS  --   --  81  --   --   BILITOT  --   --  0.9  --   --   < > = values in this interval not displayed. ------------------------------------------------------------------------------------------------------------------  Cardiac Enzymes  Recent Labs Lab 02/01/15 1835  TROPONINI 0.09*   ------------------------------------------------------------------------------------------------------------------  RADIOLOGY:  Dg Chest Port 1 View  02/06/2015  CLINICAL DATA:  Right middle lobe pneumonia EXAM: PORTABLE CHEST - 1 VIEW COMPARISON:  02/05/2015 FINDINGS: Cardiac shadow  remains enlarged. A left-sided chest wall port is again seen and stable. Persistent right basilar changes are noted. Small left-sided pleural effusion is noted as well. IMPRESSION: The overall appearance is stable from the prior exam. Electronically Signed   By: Inez Catalina M.D.   On: 02/06/2015 07:52      ASSESSMENT AND PLAN:   74 year old male with past medical history of CLL, hypothyroidism, depression, hyperlipidemia, history of prostate cancer, who presented to the hospital due to shortness of breath, weakness, sputum production and noted to be in acute respiratory failure with hypoxia.  #1  acute respiratory failure with hypoxia- secondary to pneumonia. -Extubated 11/11, on high flow oxygen since extubation day #5 -Since he is immunosuppressed given his underlying malignancy treated with broad-spectrum IV antibiotics with vancomycin, Zosyn., Changed to Rocephin  - blood CX NTD urine culture NTD sputum cultures with gram-positive cocci in pairs, group B strep sensitive to penicillin F/u CT scan of chest with contrast today,  Encouraged to use Incentive spirometry and Flutter valve. Continue NEB. Continue Flonase for nasal stuffiness.  #A. fib with RVR Well controlled with amiodarone. Discontinued  amiodarone drip and patient is started on by mouth amiodarone, f/u Dr. Fletcher Anon  # pneumonia-likely the cause of patient's acute respiratory failure with hypoxia.  on IV Rocephin and sputum is with gram-positive cocci- as above  # history of CLL-patient's white cell count is significantly elevated. -Appreciate oncology recommendations -continue to  hold his chemotherapy med given his acute infectious process. - No acute interventions.  #hypothyroidism-TSH is elevated .continue Synthroid at 125 g per day.  Need to check his TSH with PMD 1-2 weeks after recovery from the infection, as it may be high because of infection in hospital.  # hyperlipidemia-continue simvastatin.  # depression, continue Zoloft.   Discussed with Dr. Mortimer Fries. All the records are reviewed and case discussed with Care Management/Social Workerr. Management plans discussed with the patient's wife and she is  in agreement. Greater than 50% time was spent on coordination of care and face-to-face counseling.  CODE STATUS: full code.  TOTAL CRITICAL TIME TAKING CARE OF THIS PATIENT: 42  minutes.   POSSIBLE D/C IN 2-3  DAYS, DEPENDING ON CLINICAL CONDITION.   Demetrios Loll M.D on 02/07/2015 at 8:28 AM  Between 7am to 6pm - Pager - [phone] After 6pm go to [email or web address] - password [email or web address]  Office  [phone]  CC: Primary care physician; Idelle Crouch, MD

## 2015-02-07 NOTE — Progress Notes (Signed)
Patient: ZIHAN GIMBEL / Admit Date: 02/19/2015 / Date of Encounter: 02/07/2015, 10:45 AM   Subjective: No chest pain. He remains on high flow oxygen. He is for CT scan of the chest today.   Review of Systems: Review of Systems  Constitutional: Positive for malaise/fatigue. Negative for fever, chills, weight loss and diaphoresis.  HENT: Negative for congestion.   Eyes: Negative for discharge and redness.  Respiratory: Positive for cough, sputum production, shortness of breath and wheezing. Negative for hemoptysis.   Cardiovascular: Negative for chest pain, palpitations, orthopnea, claudication, leg swelling and PND.  Gastrointestinal: Negative for nausea and vomiting.  Musculoskeletal: Negative for falls.  Skin: Negative for rash.  Neurological: Positive for weakness. Negative for sensory change, speech change, focal weakness and loss of consciousness.  Endo/Heme/Allergies: Does not bruise/bleed easily.  Psychiatric/Behavioral: The patient is not nervous/anxious.     Objective: Telemetry: NSR, 70's Physical Exam: Blood pressure 121/68, pulse 68, temperature 98 F (36.7 C), temperature source Oral, resp. rate 23, height 5\' 10"  (1.778 m), weight 240 lb 1.3 oz (108.9 kg), SpO2 95 %. Body mass index is 34.45 kg/(m^2). General: Well developed, well nourished, in no acute distress. Head: Normocephalic, atraumatic, sclera non-icteric, no xanthomas, nares are without discharge. Neck: Negative for carotid bruits. JVP not elevated. Lungs: Diffuse rails. Mildly labored. Heart: RRR S1 S2 without murmurs, rubs, or gallops.  Abdomen: Soft, non-tender, non-distended with normoactive bowel sounds. No rebound/guarding. Extremities: No clubbing or cyanosis. No edema. Distal pedal pulses are 2+ and equal bilaterally. Neuro: Alert and oriented X 3. Moves all extremities spontaneously. Psych:  Responds to questions appropriately with a normal affect.   Intake/Output Summary (Last 24 hours) at  02/07/15 1045 Last data filed at 02/06/15 1808  Gross per 24 hour  Intake    770 ml  Output    451 ml  Net    319 ml    Inpatient Medications:  . albuterol  2.5 mg Nebulization Q4H  . ALPRAZolam  1 mg Oral TID  . amiodarone  400 mg Oral BID  . antiseptic oral rinse  7 mL Mouth Rinse QID  . budesonide (PULMICORT) nebulizer solution  0.25 mg Nebulization BID  . cefTRIAXone (ROCEPHIN)  IV  2 g Intravenous Q24H  . chlorhexidine gluconate  15 mL Mouth Rinse BID  . enoxaparin (LOVENOX) injection  40 mg Subcutaneous Q24H  . fluticasone  2 spray Each Nare Daily  . guaiFENesin  600 mg Oral BID  . levothyroxine  125 mcg Oral QAC breakfast  . sertraline  50 mg Oral QHS  . sodium chloride  3 mL Intravenous Q12H   Infusions:    Labs:  Recent Labs  02/06/15 0529 02/07/15 0515  NA 139 140  K 3.3* 3.7  CL 103 104  CO2 30 30  GLUCOSE 138* 152*  BUN 25* 24*  CREATININE 0.95 1.05  CALCIUM 8.7* 8.6*   No results for input(s): AST, ALT, ALKPHOS, BILITOT, PROT, ALBUMIN in the last 72 hours.  Recent Labs  02/05/15 0549 02/06/15 0529 02/07/15 0515  WBC 144.2* 118.8* 112.0*  NEUTROABS 18.7*  --   --   HGB 9.8* 9.8* 9.0*  HCT 30.7* 31.9* 29.9*  MCV 95.9 96.5 95.6  PLT 95* 115* 126*   No results for input(s): CKTOTAL, CKMB, TROPONINI in the last 72 hours. Invalid input(s): POCBNP No results for input(s): HGBA1C in the last 72 hours.   Weights: Filed Weights   02/02/15 0426 02/03/15 0500 02/04/15 0459  Weight: 238 lb 5.1 oz (108.1 kg) 238 lb 1.6 oz (108 kg) 240 lb 1.3 oz (108.9 kg)     Radiology/Studies:  Dg Abd 1 View  02/01/2015  CLINICAL DATA:  Respiratory failure EXAM: ABDOMEN - 1 VIEW COMPARISON:  02/05/2015 FINDINGS: Normal bowel gas pattern. Negative for obstruction or ileus. NG tube in the stomach. No renal calculi Right hip replacement. Surgical clips in the pelvis from prior prostatectomy. Penile prosthesis reservoir in the left lower quadrant. IMPRESSION: No acute  abnormality. Electronically Signed   By: Franchot Gallo M.D.   On: 02/01/2015 09:03   Dg Abd 1 View  02/01/2015  CLINICAL DATA:  Endotracheal tube placement. EXAM: PORTABLE CHEST 1 VIEW COMPARISON:  CT scan 04/20/2014. FINDINGS: Chest x-ray: The endotracheal tube is 3 cm above the carina. The NG tube is coursing down the esophagus and into the stomach. A left subclavian power port is noted. The tip is in the mid SVC. The heart is upper limits of normal in size. Moderate elevation of the right hemidiaphragm with overlying vascular crowding and atelectasis. No edema or definite infiltrates. No large effusions. Abdomen: The NG tube tip is in the antral region of the stomach. There is scattered air in the small bowel and colon but no definite findings for obstruction or perforation. A right hip prosthesis is noted. A reservoir for a penile prosthesis is noted on the left side. Advanced degenerative changes involving the lower lumbar spine. IMPRESSION: 1. The endotracheal tube and NG tubes are in good position. 2. No significant pulmonary findings. 3. No plain film findings for an acute abdominal process. Possible mild ileus. Electronically Signed   By: Marijo Sanes M.D.   On: 01/30/2015 22:49   Dg Chest Port 1 View  02/06/2015  CLINICAL DATA:  Right middle lobe pneumonia EXAM: PORTABLE CHEST - 1 VIEW COMPARISON:  02/05/2015 FINDINGS: Cardiac shadow remains enlarged. A left-sided chest wall port is again seen and stable. Persistent right basilar changes are noted. Small left-sided pleural effusion is noted as well. IMPRESSION: The overall appearance is stable from the prior exam. Electronically Signed   By: Inez Catalina M.D.   On: 02/06/2015 07:52   Dg Chest Port 1 View  02/05/2015  CLINICAL DATA:  Respiratory failure EXAM: PORTABLE CHEST 1 VIEW COMPARISON:  02/04/2015 FINDINGS: Moderate right effusion with right lower lobe atelectasis unchanged. Mild left lower lobe airspace disease and small left effusion  slightly increased from yesterday Negative for heart failure or edema. Port-A-Cath tip in the SVC unchanged IMPRESSION: Right lower lobe atelectasis and effusion unchanged Mild progression in left lower lobe airspace disease and small left effusion. Electronically Signed   By: Franchot Gallo M.D.   On: 02/05/2015 07:36   Dg Chest Port 1 View  02/04/2015  CLINICAL DATA:  Respiratory failure. History of prostate carcinoma. Right middle lobe pneumonia. EXAM: PORTABLE CHEST 1 VIEW COMPARISON:  02/02/2015 FINDINGS: Since prior study, the patient has been extubated. The orogastric tube has been removed. Left-sided Port-A-Cath is stable with its tip in the lower superior vena cava. There is opacity at the right base obscuring the hemidiaphragm, which has increased when compared to the prior study. This is consistent with pneumonia, atelectasis or a combination. There is mild probable atelectasis at the medial left lung base, similar to the prior exam. Lung volumes are low accentuating the bronchovascular markings. No overt pulmonary edema. IMPRESSION: 1. Increased opacity at the right lung base when compared to the previous exam. This may be  due to worsening pneumonia, new or worsened atelectasis or a combination. 2. Status post extubation and removal of the orogastric tube. No other change. Electronically Signed   By: Lajean Manes M.D.   On: 02/04/2015 07:52   Dg Chest Port 1 View  02/02/2015  CLINICAL DATA:  Respiratory failure. EXAM: PORTABLE CHEST 1 VIEW COMPARISON:  02/01/2015. FINDINGS: Endotracheal tube, Port-A-Cath, NG tube in stable position. Stable cardiomegaly. Low lung volumes with bibasilar atelectasis. Mild infiltrate in the right lung base again cannot be excluded. No pleural effusion or pneumothorax . IMPRESSION: 1. Lines and tubes in stable position. 2.  Stable cardiomegaly. 3. Low lung volumes with stable bibasilar atelectasis. Mild infiltrate right lung base again cannot be excluded. No interim  change. Electronically Signed   By: [place]   On: 02/02/2015 07:07   Dg Chest Port 1 View  02/01/2015  CLINICAL DATA:  Shortness of breath.  Respiratory failure . EXAM: PORTABLE CHEST 1 VIEW COMPARISON:  02/09/2015. FINDINGS: Endotracheal tube and NG tube stable position. Port-A-Cath in stable position. Low lung volumes with mild basilar atelectasis again noted. Mild infiltrate right lung base cannot be excluded. Stable cardiomegaly. No pleural effusion or pneumothorax . IMPRESSION: 1. Lines and tubes in stable position. 2. Stable cardiomegaly. 3. Low lung volumes with stable bibasilar atelectasis. Mild infiltrate right lung base cannot be excluded . Electronically Signed   By: Marcello Moores  Register   On: 02/01/2015 08:26   Dg Chest Port 1 View  02/10/2015  CLINICAL DATA:  Endotracheal tube placement. EXAM: PORTABLE CHEST 1 VIEW COMPARISON:  CT scan 04/20/2014. FINDINGS: Chest x-ray: The endotracheal tube is 3 cm above the carina. The NG tube is coursing down the esophagus and into the stomach. A left subclavian power port is noted. The tip is in the mid SVC. The heart is upper limits of normal in size. Moderate elevation of the right hemidiaphragm with overlying vascular crowding and atelectasis. No edema or definite infiltrates. No large effusions. Abdomen: The NG tube tip is in the antral region of the stomach. There is scattered air in the small bowel and colon but no definite findings for obstruction or perforation. A right hip prosthesis is noted. A reservoir for a penile prosthesis is noted on the left side. Advanced degenerative changes involving the lower lumbar spine. IMPRESSION: 1. The endotracheal tube and NG tubes are in good position. 2. No significant pulmonary findings. 3. No plain film findings for an acute abdominal process. Possible mild ileus. Electronically Signed   By: Marijo Sanes M.D.   On: 01/30/2015 22:49   Dg Chest Port 1 View  01/26/2015  CLINICAL DATA:  Shortness of breath  and cough for 1 day EXAM: PORTABLE CHEST 1 VIEW COMPARISON:  Aug 14, 2014 FINDINGS: There is patchy infiltrate in the medial right base. The lungs elsewhere clear. Heart is upper normal in size with pulmonary vascularity within normal limits. No adenopathy. Port-A-Cath tip is in the superior vena cava. No pneumothorax. IMPRESSION: Patchy infiltrate medial right base. Lungs elsewhere clear. No change in cardiac silhouette. Electronically Signed   By: Lowella Grip III M.D.   On: 02/17/2015 17:44     Assessment and Plan   1. AF RVR: -Paroxysmal atrial fibrillation In the setting of RML PNA.  -Maintaining NSR. No arrhythmia in the past 72 hours -High risk for recurrence of Afib in the acute setting -IV amio was stopped 11/12.  -On PO amiodarone 400 mg bid  -Taper in approximately 1 week (11/20)  to 200 mg bid x 1 week, followed by 200 mg daily thereafter -Not on anticoagulation at this time, will monitor closely as an inpatient -We'll need outpatient follow-up, contact information provided   2. 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  -White count greater than 100  4. ELEVATED TROPONIN: -Minimal elevation in setting of PNA -Can consider out patient stress testing in the future   5) Hypokalemia -Will replete potassium, 3.7  Signed, Christell Faith, PA-C Pager: [phone] 02/07/2015, 10:45 AM   I have seen, examined and evaluated the patient this AM on rounds along with Mr. Purcell Mouton.  After reviewing all the available data and chart,  I agree with his findings, examination as well as impression recommendations.  Remains stable in NSR on PO Amiodarone -- probably would not need Amiodarone long term, but continue with Load Taper for now - can readdress in OP setting,.  Mild edema & continued O2 requirement - agree with  diuresis. Elevated Troponin level in this setting is likely just a High Risk indicator of his current condition.  Possible due to Afib RVR & hypoxia - can evaluate as OP  Will follow.   Leonie Man, M.D., M.S. Interventional Cardiologist   Pager # [phone]

## 2015-02-07 NOTE — Progress Notes (Addendum)
Reclined patient's HOB to 35 degrees so that he could urinate. O2 sat decreased to 85%. Respiratory therapy notified of drop in O2 sat.

## 2015-02-07 NOTE — Progress Notes (Signed)
CC: follow up SOB, hypoxia  HPI: Some SOB,  Rattling cough Still requiring high flow [place] O2 fio2 65% Awaiting for CT chest pending   Filed Vitals:   02/07/15 0500 02/07/15 0600 02/07/15 0700 02/07/15 0741  BP: 139/66 134/66 138/73   Pulse: 73 70 72   Temp:    98 F (36.7 C)  TempSrc:    Oral  Resp: 29 32 21   Height:      Weight:      SpO2: 97% 96% 98%    Frail,  HEENT WNL No JVD noted No wheezes, diffuse coarse rhonchi IRIR, no M noted NABS, soft Ext warm, no edema    EXCELLENT SPECIMEN - 90-100% WBCS  MANY WBC SEEN  MANY GRAM POSITIVE COCCI IN PAIRS IN CHAINS  FEW GRAM NEGATIVE COCCOBACILLI       Culture RARE GROWTH GROUP A STREP (S.PYOGENES) ISOLATED              BMP Latest Ref Rng 02/07/2015 02/06/2015 02/05/2015  Glucose 65 - 99 mg/dL 152(H) 138(H) 152(H)  BUN 6 - 20 mg/dL 24(H) 25(H) 23(H)  Creatinine 0.61 - 1.24 mg/dL 1.05 0.95 1.02  Sodium 135 - 145 mmol/L 140 139 143  Potassium 3.5 - 5.1 mmol/L 3.7 3.3(L) 3.5  Chloride 101 - 111 mmol/L 104 103 105  CO2 22 - 32 mmol/L 30 30 31   Calcium 8.9 - 10.3 mg/dL 8.6(L) 8.7(L) 8.7(L)    CBC Latest Ref Rng 02/07/2015 02/06/2015 02/05/2015  WBC 3.8 - 10.6 K/uL 112.0(HH) 118.8(HH) 144.2(HH)  Hemoglobin 13.0 - 18.0 g/dL 9.0(L) 9.8(L) 9.8(L)  Hematocrit 40.0 - 52.0 % 29.9(L) 31.9(L) 30.7(L)  Platelets 150 - 440 K/uL 126(L) 115(L) 95(L)   Results for orders placed or performed during the hospital encounter of 02/18/2015  Blood Culture (routine x 2)     Status: None (Preliminary result)   Collection Time: 01/30/2015  5:12 PM  Result Value Ref Range Status   Specimen Description BLOOD RIGHT HAND  Final   Special Requests BOTTLES DRAWN AEROBIC AND ANAEROBIC [place]  Final   Culture NO GROWTH 5 DAYS  Final   Report Status PENDING  Incomplete  Blood Culture (routine x 2)     Status: None   Collection Time: 02/17/2015  5:13 PM  Result Value Ref Range Status   Specimen Description BLOOD RIGHT ARM  Final   Special  Requests BOTTLES DRAWN AEROBIC AND ANAEROBIC [place]  Final   Culture NO GROWTH 5 DAYS  Final   Report Status 02/05/2015 FINAL  Final  Culture, sputum-assessment     Status: None   Collection Time: 02/10/2015  5:20 PM  Result Value Ref Range Status   Specimen Description SPUTUM  Final   Special Requests Immunocompromised  Final   Sputum evaluation THIS SPECIMEN IS ACCEPTABLE FOR SPUTUM CULTURE  Final   Report Status 02/14/2015 FINAL  Final  Urine culture     Status: None   Collection Time: 02/10/2015  5:20 PM  Result Value Ref Range Status   Specimen Description URINE, RANDOM  Final   Special Requests Immunocompromised  Final   Culture INSIGNIFICANT GROWTH  Final   Report Status 02/04/2015 FINAL  Final  Culture, respiratory (NON-Expectorated)     Status: None   Collection Time: 01/23/2015  5:20 PM  Result Value Ref Range Status   Specimen Description SPUTUM  Final   Special Requests Immunocompromised Reflexed from [id number]  Final   Gram Stain   Final    GOOD SPECIMEN -  80-90% WBCS MODERATE WBC SEEN MANY GRAM POSITIVE COCCI IN CHAINS IN CLUSTERS MANY GRAM NEGATIVE COCCOBACILLI    Culture   Final    HEAVY GROWTH GROUP A STREP (S.PYOGENES) ISOLATED There is no known Penicillin Resistant Beta Streptococcus in the [place] For patients that are Penicillin-allergic, Erythromycin is 85-94% susceptible, and Clindamycin is 80% susceptible.  Contact Microbiology within 7 days if sensitivity testing is  required.      Report Status 02/02/2015 FINAL  Final  MRSA PCR Screening     Status: None   Collection Time: 02/13/2015 10:47 PM  Result Value Ref Range Status   MRSA by PCR NEGATIVE NEGATIVE Final    Comment:        The GeneXpert MRSA Assay (FDA approved for NASAL specimens only), is one component of a comprehensive MRSA colonization surveillance program. It is not intended to diagnose MRSA infection nor to guide or monitor treatment for MRSA infections.   Culture, respiratory (NON-Expectorated)      Status: None   Collection Time: 02/01/15  1:40 AM  Result Value Ref Range Status   Specimen Description TRACHEAL ASPIRATE  Final   Special Requests NONE  Final   Gram Stain   Final    EXCELLENT SPECIMEN - 90-100% WBCS MANY WBC SEEN MANY GRAM POSITIVE COCCI IN PAIRS IN CHAINS FEW GRAM NEGATIVE COCCOBACILLI    Culture   Final    RARE GROWTH GROUP A STREP (S.PYOGENES) ISOLATED There is no known Penicillin Resistant Beta Streptococcus in the [place] For patients that are Penicillin-allergic, Erythromycin is 85-94% susceptible, and Clindamycin is 80% susceptible.  Contact Microbiology within 7 days if sensitivity testing is  required.      Report Status 02/06/2015 FINAL  Final  Urine culture     Status: None   Collection Time: 02/02/15  2:35 PM  Result Value Ref Range Status   Specimen Description URINE, RANDOM  Final   Special Requests Immunocompromised  Final   Culture NO GROWTH 2 DAYS  Final   Report Status 02/04/2015 FINAL  Final   Anti-infectives    Start     Dose/Rate Route Frequency Ordered Stop   02/02/15 1500  cefTRIAXone (ROCEPHIN) 2 g in dextrose 5 % 50 mL IVPB     2 g 100 mL/hr over 30 Minutes Intravenous Every 24 hours 02/02/15 1424 02/09/15 2359   02/01/15 0700  vancomycin (VANCOCIN) IVPB 1000 mg/200 mL premix  Status:  Discontinued     1,000 mg 200 mL/hr over 60 Minutes Intravenous Every 12 hours 02/02/2015 2229 02/02/15 1042   01/23/2015 2245  piperacillin-tazobactam (ZOSYN) IVPB 4.5 g  Status:  Discontinued     4.5 g 25 mL/hr over 240 Minutes Intravenous 3 times per day 02/19/2015 2235 02/02/15 1424   02/10/2015 1800  vancomycin (VANCOCIN) IVPB 1000 mg/200 mL premix     1,000 mg 200 mL/hr over 60 Minutes Intravenous  Once 02/08/2015 1759 01/28/2015 2006   01/23/2015 1800  cefTRIAXone (ROCEPHIN) 1 g in dextrose 5 % 50 mL IVPB     1 g 100 mL/hr over 30 Minutes Intravenous  Once 01/28/2015 1759 02/16/2015 1952    TTE 11/11: LVEF 55 - 60%  CXR: RLL atelectasis/collapse    IMPRESSION: Acute respiratory failure RLL PNA RLL atelectasis New onset AF > NSR on amiodarone Hypokalemia, resolved Thrombocytopenia, improving CLL with severe leukocytosis   PLAN/REC: Cont SDU level of care Cont high flow O2 by [place]-wean fio2 as tolerated Cont ceftriaxone for strep pyogens pneumonia Chest percussion  as tolerated Albuterol nebs 11/13 to facilitate mucociliary function Flutter valve 11/13 Cont amiodarone PO Monitor BMET intermittently Monitor I/Os Correct electrolytes as indicated DVT px: enoxaparin Monitor CBC intermittently Transfuse per usual guidelines Xanax scheduled 1 mg TID for extreme anxiety-seems to be working well  CT chest pending today    The Patient requires high complexity decision making for assessment and support, frequent evaluation and titration of therapies.   Corrin Parker, M.D.  [facility]  Medical Director [place] Director [facility] Cardio-Pulmonary Department

## 2015-02-07 NOTE — Progress Notes (Signed)
Physical Therapy Treatment Patient Details Name: Dean Collins MRN: [id number] DOB: 26-Jul-1940 Today's Date: 02/07/2015    History of Present Illness Pt had R hip sx 6 months ago, now here with respiratory failure, was extubated 11/11    PT Comments    Pt is eager to participate with PT, but is very tired and needs to focus on deep breathing just to keep O2 in the high 80s on HFNC.  Pt needs rest breaks between even light bed exercises and his sats consistently drop (at times into the 70s) with minimal LE activity.  Pt wanting to do more, but simply too tired and labored to do mobility/standing/etc.  Follow Up Recommendations  SNF;Home health PT (per progress and medical status)     Equipment Recommendations       Recommendations for Other Services       Precautions / Restrictions Precautions Precautions: Fall Restrictions Weight Bearing Restrictions: No    Mobility  Bed Mobility               General bed mobility comments: deferred bed mobility as pt's O2 stats (on HFNC) dropped as low as 77% and stayed 80-85% between light in-bed exercises.  Transfers                    Ambulation/Gait                 Stairs            Wheelchair Mobility    Modified Rankin (Stroke Patients Only)       Balance                                    Cognition Arousal/Alertness: Awake/alert Behavior During Therapy: WFL for tasks assessed/performed Overall Cognitive Status: Within Functional Limits for tasks assessed                      Exercises General Exercises - Lower Extremity Ankle Circles/Pumps: AROM;10 reps Quad Sets: Strengthening;10 reps Gluteal Sets: Strengthening;10 reps Heel Slides: Strengthening;10 reps Hip ABduction/ADduction: AROM;Strengthening;10 reps    General Comments        Pertinent Vitals/Pain Pain Assessment: No/denies pain    Home Living                      Prior Function             PT Goals (current goals can now be found in the care plan section) Progress towards PT goals: Not progressing toward goals - comment (pt with low O2 sats and general fatigue with activity)    Frequency  Min 2X/week    PT Plan Current plan remains appropriate    Co-evaluation             End of Session   Activity Tolerance: Patient limited by fatigue Patient left: in bed     Time: 1310-1327 PT Time Calculation (min) (ACUTE ONLY): 17 min  Charges:  $Therapeutic Exercise: 8-22 mins                    G Codes:     Wayne Both, PT, DPT [phone]  Kreg Shropshire 02/07/2015, 4:06 PM

## 2015-02-07 NOTE — Therapy (Signed)
Called to patient room due to dropping SpO2. FiO2 increased to .80 then .85 then 1.00, Flow increased to 55 then 60 L with little effect. SpO2 89-91%. Patient informed that he may need BiPAP. Patient declined. Asked if he would want to go on the vent if need be. He replied, "I'll go home first." Nurse [place] informed of same.

## 2015-02-08 LAB — COMP PANEL: LEUKEMIA/LYMPHOMA

## 2015-02-08 LAB — CBC WITH DIFFERENTIAL/PLATELET
BASOS PCT: 0 %
Basophils Absolute: 0 10*3/uL (ref 0–0.1)
EOS ABS: 0 10*3/uL (ref 0–0.7)
EOS PCT: 0 %
HEMATOCRIT: 32.2 % — AB (ref 40.0–52.0)
HEMOGLOBIN: 9.6 g/dL — AB (ref 13.0–18.0)
LYMPHS PCT: 83 %
Lymphs Abs: 110.8 10*3/uL — ABNORMAL HIGH (ref 1.0–3.6)
MCH: 28.8 pg (ref 26.0–34.0)
MCHC: 29.7 g/dL — AB (ref 32.0–36.0)
MCV: 96.9 fL (ref 80.0–100.0)
Monocytes Absolute: 2.7 10*3/uL — ABNORMAL HIGH (ref 0.2–1.0)
Monocytes Relative: 2 %
NEUTROS PCT: 15 %
Neutro Abs: 20 10*3/uL — ABNORMAL HIGH (ref 1.4–6.5)
Platelets: 136 10*3/uL — ABNORMAL LOW (ref 150–440)
RBC: 3.33 MIL/uL — ABNORMAL LOW (ref 4.40–5.90)
RDW: 15.6 % — ABNORMAL HIGH (ref 11.5–14.5)
WBC: 133.5 10*3/uL (ref 3.8–10.6)

## 2015-02-08 LAB — BASIC METABOLIC PANEL
ANION GAP: 7 (ref 5–15)
BUN: 29 mg/dL — ABNORMAL HIGH (ref 6–20)
CHLORIDE: 100 mmol/L — AB (ref 101–111)
CO2: 32 mmol/L (ref 22–32)
Calcium: 9 mg/dL (ref 8.9–10.3)
Creatinine, Ser: 1.01 mg/dL (ref 0.61–1.24)
GFR calc non Af Amer: >60 mL/min (ref >60)
Glucose, Bld: 205 mg/dL — ABNORMAL HIGH (ref 65–99)
Potassium: 4.8 mmol/L (ref 3.5–5.1)
Sodium: 139 mmol/L (ref 135–145)

## 2015-02-08 MED ORDER — SULFAMETHOXAZOLE-TRIMETHOPRIM 800-160 MG PO TABS
2.0000 | ORAL_TABLET | Freq: Three times a day (TID) | ORAL | Status: DC
  Administered 2015-02-08 – 2015-02-11 (×10): 2 via ORAL
  Filled 2015-02-08 (×6): qty 2
  Filled 2015-02-08: qty 1
  Filled 2015-02-08 (×6): qty 2

## 2015-02-08 MED ORDER — DEXTROSE 5 % IV SOLN
4.0000 10*6.[IU] | INTRAVENOUS | Status: DC
  Administered 2015-02-08 – 2015-02-11 (×14): 4 10*6.[IU] via INTRAVENOUS
  Filled 2015-02-08 (×24): qty 4

## 2015-02-08 MED ORDER — IMMUNE GLOBULIN (HUMAN) 5 GM/50ML IV SOLN
400.0000 mg/kg | INTRAVENOUS | Status: DC
  Administered 2015-02-09 – 2015-02-10 (×3): 45 g via INTRAVENOUS
  Filled 2015-02-08 (×6): qty 50

## 2015-02-08 MED ORDER — CLINDAMYCIN PHOSPHATE 900 MG/50ML IV SOLN
900.0000 mg | Freq: Three times a day (TID) | INTRAVENOUS | Status: DC
  Administered 2015-02-08 – 2015-02-11 (×8): 900 mg via INTRAVENOUS
  Filled 2015-02-08 (×11): qty 50

## 2015-02-08 NOTE — Progress Notes (Signed)
Inpatient Diabetes Program Recommendations  AACE/ADA: New Consensus Statement on Inpatient Glycemic Control (2015)  Target Ranges:  Prepandial:   less than 140 mg/dL      [facility] postprandial:   less than 180 mg/dL (1-2 hours)      Critically ill patients:  140 - 180 mg/dL  Results for JANORRIS, SOKOL (MRN [id number]) as of 02/08/2015 10:38  Ref. Range 02/08/2015 04:58  Glucose Latest Ref Range: 65-99 mg/dL 205 (H)   Review of Glycemic Control  Diabetes history: No Outpatient Diabetes medications: NA Current orders for Inpatient glycemic control: None  Inpatient Diabetes Program Recommendations: Correction (SSI): Fasting glucose 205 mg/dl this morning. Patient is ordered Solumedrol 40 mg Q12H which is likely cause of hyperglycemia. While inpatient and ordered steroids, please consider ordering CBGs with Novolog sensitive correction scale ACHS. HgbA1C: Last A1C was 6.1% on 01/14/2013. Please consider ordering an A1C to evaluate glycemic control over the past 2-3 months.  Thanks, Barnie Alderman, RN, MSN, CDE Diabetes Coordinator Inpatient Diabetes Program [phone] (Team Pager from [place] to [place]) [phone] ([facility] office) [phone] [facility] office) [phone] [facility] office)

## 2015-02-08 NOTE — Clinical Social Work Note (Signed)
CSW met with patient's wife at bedside in ICU this morning. CSW updated patient's wife on what CSW discussed with patient prior. Patient's wife stated that they would prefer [facility] if possible as he has been at [facility] in the past. CSW to check with [facility]. In the meantime, CSW extended current bed offers from  [facility], [facility], and [facility]. Patient's wife stated that if [facility] could not offer, they would chose [facility]. Shela Leff MSW,LCSW [phone]

## 2015-02-08 NOTE — Consult Note (Cosign Needed)
[facility] Infectious Disease     Reason for Consult: Fever, PNA, Grp A strep    Referring Physician: Tristin, Vandeusen Date of Admission:  02/21/2015   Principal Problem:   Acute respiratory failure with hypoxia (HCC) Active Problems:   CLL (chronic lymphocytic leukemia) (HCC)   Atrial fibrillation (HCC)   Right middle lobe pneumonia   Hypothyroidism   Pneumonia due to streptococcus, group A ([place])   Respiratory failure ([place])   Paroxysmal atrial fibrillation (HCC)   Elevated troponin   HPI: Dean Collins is a 74 y.o. male with CLL admitted 11/9 with one day of feeling poorly with SOB, cough, chills.  He was admitted with hypoxia, and intubated after failing trial of Bipap. WBC was 300K on admit. He had sputum cx sent which grew heavy Growth Grp A Strep.   Was treated with vanco and zosyn then changed to ctx.  Deferversed and was extubated and clincially improving but worsened over last several days with increasing O2 requirement and is now on [place] O2 at 75 #. Cough persists but is non productive. Some low grade fevers.   vanco 11/9-11/11 Zosyn 11/9-11/11 CTX 11/0-11/17  Past Medical History  Diagnosis Date  . Incontinence   . Arthritis   . CLL (chronic lymphocytic leukemia) ([place])     a. Dx 2003.  . 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 Diverticulitis   . History of prostate cancer   . Hypothyroidism   . Anxiety   . Right middle lobe pneumonia 02/01/2015   Past Surgical History  Procedure Laterality Date  . Replacement total knee      x2  . Prostatectomy  1993  . Colonoscopy  2013-2014?  Marland Kitchen Total hip arthroplasty Right 08/12/2014    Procedure: TOTAL HIP ARTHROPLASTY ANTERIOR APPROACH;  Surgeon: Hessie Knows, MD;  Location: [facility] ORS;  Service: Orthopedics;  Laterality: Right;  . Mohs surgery Right 10/2014    eyebrow   Social History  Substance Use Topics  . Smoking status: Former Smoker    Quit date: 03/24/1990  . Smokeless tobacco: Never Used  . Alcohol Use: 0.0 oz/week    0  Standard drinks or equivalent per week     Comment: occasionally/rare   Family History  Problem Relation Age of Onset  . Heart attack Mother   . Heart attack Father     Allergies: No Known Allergies  Current antibiotics: Antibiotics Given (last 72 hours)    Date/Time Action Medication Dose Rate   02/06/15 1435 Given   cefTRIAXone (ROCEPHIN) 2 g in dextrose 5 % 50 mL IVPB 2 g 100 mL/hr   02/07/15 1439 Given   cefTRIAXone (ROCEPHIN) 2 g in dextrose 5 % 50 mL IVPB 2 g 100 mL/hr   02/08/15 1239 Given   sulfamethoxazole-trimethoprim (BACTRIM DS,SEPTRA DS) 800-160 MG per tablet 2 tablet 2 tablet    02/08/15 1544 Given   cefTRIAXone (ROCEPHIN) 2 g in dextrose 5 % 50 mL IVPB 2 g 100 mL/hr   02/08/15 1608 Given   sulfamethoxazole-trimethoprim (BACTRIM DS,SEPTRA DS) 800-160 MG per tablet 2 tablet 2 tablet       MEDICATIONS: . albuterol  2.5 mg Nebulization Q4H  . ALPRAZolam  1 mg Oral TID  . antiseptic oral rinse  7 mL Mouth Rinse QID  . budesonide (PULMICORT) nebulizer solution  0.25 mg Nebulization BID  . chlorhexidine gluconate  15 mL Mouth Rinse BID  . clindamycin (CLEOCIN) IV  900 mg Intravenous 3 times per day  .  enoxaparin (LOVENOX) injection  40 mg Subcutaneous Q24H  . fluticasone  2 spray Each Nare Daily  . guaiFENesin  600 mg Oral BID  . levothyroxine  125 mcg Oral QAC breakfast  . methylPREDNISolone (SOLU-MEDROL) injection  40 mg Intravenous Q12H  . pencillin G potassium IV  4 Million Units Intravenous 6 times per day  . sertraline  50 mg Oral QHS  . sodium chloride  3 mL Intravenous Q12H  . sulfamethoxazole-trimethoprim  2 tablet Oral TID    Review of Systems - 11 systems reviewed and negative per HPI   OBJECTIVE: Temp:  [97.5 F (36.4 C)-98.4 F (36.9 C)] 98.4 F (36.9 C) (11/17 1600) Pulse Rate:  [63-85] 73 (11/17 1800) Resp:  [15-26] 15 (11/17 1800) BP: (115-145)/(46-82) 136/46 mmHg (11/17 1800) SpO2:  [87 %-96 %] 91 % (11/17 1800) FiO2 (%):  [80 %-90 %]  87 % (11/17 1605) Physical Exam  Constitutional: He is obese, in resp distress on 75% O2, drowsy but arousable HENT: perrla Mouth/Throat: Oropharynx is clear and dry. No oropharyngeal exudate.  Cardiovascular: Tachy.  Pulmonary/Chest: poor air movement , rhonchi throughout.  Abdominal: Soft. Bowel sounds are normal. He exhibits no distension. There is no tenderness. .  Neurological: He is alert and oriented to person, place, and time.  Skin: Skin is warm and dry. No rash noted. No erythema.  Psychiatric: He has a normal mood and affect. His behavior is normal.   LABS: Results for orders placed or performed during the hospital encounter of 01/24/2015 (from the past 48 hour(s))  CBC     Status: Abnormal   Collection Time: 02/07/15  5:15 AM  Result Value Ref Range   WBC 112.0 (HH) 3.8 - 10.6 K/uL    Comment: CRITICAL VALUE NOTED.  VALUE IS CONSISTENT WITH PREVIOUSLY REPORTED AND CALLED VALUE.   RBC 3.13 (L) 4.40 - 5.90 MIL/uL   Hemoglobin 9.0 (L) 13.0 - 18.0 g/dL   HCT 29.9 (L) 40.0 - 52.0 %   MCV 95.6 80.0 - 100.0 fL   MCH 28.8 26.0 - 34.0 pg   MCHC 30.1 (L) 32.0 - 36.0 g/dL   RDW 15.2 (H) 11.5 - 14.5 %   Platelets 126 (L) 150 - 440 K/uL  Basic metabolic panel     Status: Abnormal   Collection Time: 02/07/15  5:15 AM  Result Value Ref Range   Sodium 140 135 - 145 mmol/L   Potassium 3.7 3.5 - 5.1 mmol/L   Chloride 104 101 - 111 mmol/L   CO2 30 22 - 32 mmol/L   Glucose, Bld 152 (H) 65 - 99 mg/dL   BUN 24 (H) 6 - 20 mg/dL   Creatinine, Ser 1.05 0.61 - 1.24 mg/dL   Calcium 8.6 (L) 8.9 - 10.3 mg/dL   GFR calc non Af Amer >60 >60 mL/min   GFR calc Af Amer >60 >60 mL/min    Comment: (NOTE) The eGFR has been calculated using the CKD EPI equation. This calculation has not been validated in all clinical situations. eGFR's persistently <60 mL/min signify possible Chronic Kidney Disease.    Anion gap 6 5 - 15  Basic metabolic panel     Status: Abnormal   Collection Time: 02/08/15   4:58 AM  Result Value Ref Range   Sodium 139 135 - 145 mmol/L   Potassium 4.8 3.5 - 5.1 mmol/L   Chloride 100 (L) 101 - 111 mmol/L   CO2 32 22 - 32 mmol/L   Glucose, Bld 205 (H)  65 - 99 mg/dL   BUN 29 (H) 6 - 20 mg/dL   Creatinine, Ser 1.01 0.61 - 1.24 mg/dL   Calcium 9.0 8.9 - 10.3 mg/dL   GFR calc non Af Amer >60 >60 mL/min   GFR calc Af Amer >60 >60 mL/min    Comment: (NOTE) The eGFR has been calculated using the CKD EPI equation. This calculation has not been validated in all clinical situations. eGFR's persistently <60 mL/min signify possible Chronic Kidney Disease.    Anion gap 7 5 - 15  CBC with Differential/Platelet     Status: Abnormal   Collection Time: 02/08/15  4:58 AM  Result Value Ref Range   WBC 133.5 (HH) 3.8 - 10.6 K/uL    Comment: CRITICAL RESULT CALLED TO, READ BACK BY AND VERIFIED WITH: PAMELA RUMLEY AT 0629 02/08/15 WDM    RBC 3.33 (L) 4.40 - 5.90 MIL/uL   Hemoglobin 9.6 (L) 13.0 - 18.0 g/dL   HCT 32.2 (L) 40.0 - 52.0 %   MCV 96.9 80.0 - 100.0 fL   MCH 28.8 26.0 - 34.0 pg   MCHC 29.7 (L) 32.0 - 36.0 g/dL   RDW 15.6 (H) 11.5 - 14.5 %   Platelets 136 (L) 150 - 440 K/uL   Neutrophils Relative % 15 %   Lymphocytes Relative 83 %   Monocytes Relative 2 %   Eosinophils Relative 0 %   Basophils Relative 0 %   Neutro Abs 20.0 (H) 1.4 - 6.5 K/uL   Lymphs Abs 110.8 (H) 1.0 - 3.6 K/uL   Monocytes Absolute 2.7 (H) 0.2 - 1.0 K/uL   Eosinophils Absolute 0.0 0 - 0.7 K/uL   Basophils Absolute 0.0 0 - 0.1 K/uL   No components found for: ESR, C REACTIVE PROTEIN MICRO: Recent Results (from the past 720 hour(s))  Blood Culture (routine x 2)     Status: None (Preliminary result)   Collection Time: 01/28/2015  5:12 PM  Result Value Ref Range Status   Specimen Description BLOOD RIGHT HAND  Final   Special Requests BOTTLES DRAWN AEROBIC AND ANAEROBIC [place]  Final   Culture NO GROWTH 5 DAYS  Final   Report Status PENDING  Incomplete  Blood Culture (routine x 2)      Status: None   Collection Time: 02/15/2015  5:13 PM  Result Value Ref Range Status   Specimen Description BLOOD RIGHT ARM  Final   Special Requests BOTTLES DRAWN AEROBIC AND ANAEROBIC [place]  Final   Culture NO GROWTH 5 DAYS  Final   Report Status 02/05/2015 FINAL  Final  Culture, sputum-assessment     Status: None   Collection Time: 02/20/2015  5:20 PM  Result Value Ref Range Status   Specimen Description SPUTUM  Final   Special Requests Immunocompromised  Final   Sputum evaluation THIS SPECIMEN IS ACCEPTABLE FOR SPUTUM CULTURE  Final   Report Status 02/06/2015 FINAL  Final  Urine culture     Status: None   Collection Time: 02/16/2015  5:20 PM  Result Value Ref Range Status   Specimen Description URINE, RANDOM  Final   Special Requests Immunocompromised  Final   Culture INSIGNIFICANT GROWTH  Final   Report Status 02/04/2015 FINAL  Final  Culture, respiratory (NON-Expectorated)     Status: None   Collection Time: 02/13/2015  5:20 PM  Result Value Ref Range Status   Specimen Description SPUTUM  Final   Special Requests Immunocompromised Reflexed from [id number]  Final   Gram Stain  Final    GOOD SPECIMEN - 80-90% WBCS MODERATE WBC SEEN MANY GRAM POSITIVE COCCI IN CHAINS IN CLUSTERS MANY GRAM NEGATIVE COCCOBACILLI    Culture   Final    HEAVY GROWTH GROUP A STREP (S.PYOGENES) ISOLATED There is no known Penicillin Resistant Beta Streptococcus in the [place] For patients that are Penicillin-allergic, Erythromycin is 85-94% susceptible, and Clindamycin is 80% susceptible.  Contact Microbiology within 7 days if sensitivity testing is  required.      Report Status 02/02/2015 FINAL  Final  MRSA PCR Screening     Status: None   Collection Time: 02/07/2015 10:47 PM  Result Value Ref Range Status   MRSA by PCR NEGATIVE NEGATIVE Final    Comment:        The GeneXpert MRSA Assay (FDA approved for NASAL specimens only), is one component of a comprehensive MRSA colonization surveillance program. It is  not intended to diagnose MRSA infection nor to guide or monitor treatment for MRSA infections.   Culture, respiratory (NON-Expectorated)     Status: None   Collection Time: 02/01/15  1:40 AM  Result Value Ref Range Status   Specimen Description TRACHEAL ASPIRATE  Final   Special Requests NONE  Final   Gram Stain   Final    EXCELLENT SPECIMEN - 90-100% WBCS MANY WBC SEEN MANY GRAM POSITIVE COCCI IN PAIRS IN CHAINS FEW GRAM NEGATIVE COCCOBACILLI    Culture   Final    RARE GROWTH GROUP A STREP (S.PYOGENES) ISOLATED There is no known Penicillin Resistant Beta Streptococcus in the [place] For patients that are Penicillin-allergic, Erythromycin is 85-94% susceptible, and Clindamycin is 80% susceptible.  Contact Microbiology within 7 days if sensitivity testing is  required.      Report Status 02/06/2015 FINAL  Final  Urine culture     Status: None   Collection Time: 02/02/15  2:35 PM  Result Value Ref Range Status   Specimen Description URINE, RANDOM  Final   Special Requests Immunocompromised  Final   Culture NO GROWTH 2 DAYS  Final   Report Status 02/04/2015 FINAL  Final    IMAGING: Dg Abd 1 View  02/01/2015  CLINICAL DATA:  Respiratory failure EXAM: ABDOMEN - 1 VIEW COMPARISON:  01/29/2015 FINDINGS: Normal bowel gas pattern. Negative for obstruction or ileus. NG tube in the stomach. No renal calculi Right hip replacement. Surgical clips in the pelvis from prior prostatectomy. Penile prosthesis reservoir in the left lower quadrant. IMPRESSION: No acute abnormality. Electronically Signed   By: Franchot Gallo M.D.   On: 02/01/2015 09:03   Dg Abd 1 View  01/23/2015  CLINICAL DATA:  Endotracheal tube placement. EXAM: PORTABLE CHEST 1 VIEW COMPARISON:  CT scan 04/20/2014. FINDINGS: Chest x-ray: The endotracheal tube is 3 cm above the carina. The NG tube is coursing down the esophagus and into the stomach. A left subclavian power port is noted. The tip is in the mid SVC. The heart is upper  limits of normal in size. Moderate elevation of the right hemidiaphragm with overlying vascular crowding and atelectasis. No edema or definite infiltrates. No large effusions. Abdomen: The NG tube tip is in the antral region of the stomach. There is scattered air in the small bowel and colon but no definite findings for obstruction or perforation. A right hip prosthesis is noted. A reservoir for a penile prosthesis is noted on the left side. Advanced degenerative changes involving the lower lumbar spine. IMPRESSION: 1. The endotracheal tube and NG tubes are in good position.  2. No significant pulmonary findings. 3. No plain film findings for an acute abdominal process. Possible mild ileus. Electronically Signed   By: Marijo Sanes M.D.   On: 01/28/2015 22:49   Dg Chest Port 1 View  02/06/2015  CLINICAL DATA:  Right middle lobe pneumonia EXAM: PORTABLE CHEST - 1 VIEW COMPARISON:  02/05/2015 FINDINGS: Cardiac shadow remains enlarged. A left-sided chest wall port is again seen and stable. Persistent right basilar changes are noted. Small left-sided pleural effusion is noted as well. IMPRESSION: The overall appearance is stable from the prior exam. Electronically Signed   By: Inez Catalina M.D.   On: 02/06/2015 07:52   Dg Chest Port 1 View  02/05/2015  CLINICAL DATA:  Respiratory failure EXAM: PORTABLE CHEST 1 VIEW COMPARISON:  02/04/2015 FINDINGS: Moderate right effusion with right lower lobe atelectasis unchanged. Mild left lower lobe airspace disease and small left effusion slightly increased from yesterday Negative for heart failure or edema. Port-A-Cath tip in the SVC unchanged IMPRESSION: Right lower lobe atelectasis and effusion unchanged Mild progression in left lower lobe airspace disease and small left effusion. Electronically Signed   By: Franchot Gallo M.D.   On: 02/05/2015 07:36   Dg Chest Port 1 View  02/04/2015  CLINICAL DATA:  Respiratory failure. History of prostate carcinoma. Right middle  lobe pneumonia. EXAM: PORTABLE CHEST 1 VIEW COMPARISON:  02/02/2015 FINDINGS: Since prior study, the patient has been extubated. The orogastric tube has been removed. Left-sided Port-A-Cath is stable with its tip in the lower superior vena cava. There is opacity at the right base obscuring the hemidiaphragm, which has increased when compared to the prior study. This is consistent with pneumonia, atelectasis or a combination. There is mild probable atelectasis at the medial left lung base, similar to the prior exam. Lung volumes are low accentuating the bronchovascular markings. No overt pulmonary edema. IMPRESSION: 1. Increased opacity at the right lung base when compared to the previous exam. This may be due to worsening pneumonia, new or worsened atelectasis or a combination. 2. Status post extubation and removal of the orogastric tube. No other change. Electronically Signed   By: Lajean Manes M.D.   On: 02/04/2015 07:52   Dg Chest Port 1 View  02/02/2015  CLINICAL DATA:  Respiratory failure. EXAM: PORTABLE CHEST 1 VIEW COMPARISON:  02/01/2015. FINDINGS: Endotracheal tube, Port-A-Cath, NG tube in stable position. Stable cardiomegaly. Low lung volumes with bibasilar atelectasis. Mild infiltrate in the right lung base again cannot be excluded. No pleural effusion or pneumothorax . IMPRESSION: 1. Lines and tubes in stable position. 2.  Stable cardiomegaly. 3. Low lung volumes with stable bibasilar atelectasis. Mild infiltrate right lung base again cannot be excluded. No interim change. Electronically Signed   By: [place]   On: 02/02/2015 07:07   Dg Chest Port 1 View  02/01/2015  CLINICAL DATA:  Shortness of breath.  Respiratory failure . EXAM: PORTABLE CHEST 1 VIEW COMPARISON:  02/15/2015. FINDINGS: Endotracheal tube and NG tube stable position. Port-A-Cath in stable position. Low lung volumes with mild basilar atelectasis again noted. Mild infiltrate right lung base cannot be excluded. Stable  cardiomegaly. No pleural effusion or pneumothorax . IMPRESSION: 1. Lines and tubes in stable position. 2. Stable cardiomegaly. 3. Low lung volumes with stable bibasilar atelectasis. Mild infiltrate right lung base cannot be excluded . Electronically Signed   By: Marcello Moores  Register   On: 02/01/2015 08:26   Dg Chest Port 1 View  02/21/2015  CLINICAL DATA:  Endotracheal tube  placement. EXAM: PORTABLE CHEST 1 VIEW COMPARISON:  CT scan 04/20/2014. FINDINGS: Chest x-ray: The endotracheal tube is 3 cm above the carina. The NG tube is coursing down the esophagus and into the stomach. A left subclavian power port is noted. The tip is in the mid SVC. The heart is upper limits of normal in size. Moderate elevation of the right hemidiaphragm with overlying vascular crowding and atelectasis. No edema or definite infiltrates. No large effusions. Abdomen: The NG tube tip is in the antral region of the stomach. There is scattered air in the small bowel and colon but no definite findings for obstruction or perforation. A right hip prosthesis is noted. A reservoir for a penile prosthesis is noted on the left side. Advanced degenerative changes involving the lower lumbar spine. IMPRESSION: 1. The endotracheal tube and NG tubes are in good position. 2. No significant pulmonary findings. 3. No plain film findings for an acute abdominal process. Possible mild ileus. Electronically Signed   By: Marijo Sanes M.D.   On: 01/28/2015 22:49   Dg Chest Port 1 View  02/15/2015  CLINICAL DATA:  Shortness of breath and cough for 1 day EXAM: PORTABLE CHEST 1 VIEW COMPARISON:  Aug 14, 2014 FINDINGS: There is patchy infiltrate in the medial right base. The lungs elsewhere clear. Heart is upper normal in size with pulmonary vascularity within normal limits. No adenopathy. Port-A-Cath tip is in the superior vena cava. No pneumothorax. IMPRESSION: Patchy infiltrate medial right base. Lungs elsewhere clear. No change in cardiac silhouette.  Electronically Signed   By: Lowella Grip III M.D.   On: 02/01/2015 17:44    Assessment:   Dean Collins is a 74 y.o. 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  Initially improved but clinically worsening from resp viewpoint.   Grp A strep PNA is quite severe with high mortality and the patient is particularly immunocompromised so that could explain his worsening course. [place] is at high risk of OIs including PCP, fungal and viral pneumonias. Has low IGG levels  Recommendations Change ctx to pcn and clindamycin Continue bactrim Will give IVIG if ok with oncology since it is recommended in patients with invasive Grp A strep infections and in CLL with low IG levels Discussed with patient, family and Dr Mortimer Fries  Thank you very much for allowing me to participate in the care of this patient. Please call with questions.   Cheral Marker. Ola Spurr, MD

## 2015-02-08 NOTE — Progress Notes (Addendum)
[facility] NAME: Dean Collins    MR#:  [id number]  DATE OF BIRTH:  1940/11/17  SUBJECTIVE:  CHIEF COMPLAINT:  Patient is extubated 11/11 and on  Still on high flow oxygen with SAT 89%.  SOB but no wheezing.   REVIEW OF SYSTEMS:   CONSTITUTIONAL: No fever, has weakness.  EYES: No blurred or double vision.  EARS, NOSE, AND THROAT: No tinnitus or ear pain. Stuffy nose. RESPIRATORY: has cough and shortness of breath, no wheezing, no hemoptysis.  CARDIOVASCULAR: No chest pain, orthopnea, edema.  GASTROINTESTINAL: No nausea, vomiting, diarrhea or abdominal pain.  GENITOURINARY: No dysuria, hematuria.  ENDOCRINE: No polyuria, nocturia,  HEMATOLOGY: No anemia, easy bruising or bleeding SKIN: No rash or lesion. MUSCULOSKELETAL: No joint pain or arthritis.  NEUROLOGIC: No tingling, numbness, weakness.  PSYCHIATRY: No anxiety or depression.    DRUG ALLERGIES:  No Known Allergies  VITALS:  Blood pressure 123/67, pulse 63, temperature 97.6 F (36.4 C), temperature source Oral, resp. rate 20, height 5\' 10"  (1.778 m), weight 108.9 kg (240 lb 1.3 oz), SpO2 93 %.  PHYSICAL EXAMINATION:  GENERAL:  74 y.o.-year-old patient lying in the bed with no acute distress.  EYES: Pupils equal, round, reactive to light and accommodation. No scleral icterus. HEENT: Head atraumatic, normocephalic.  Wheezing sound present through his nose. NECK:  Supple, no jugular venous distention. No thyroid enlargement, no tenderness.  LUNGS: On high flow oxygen. Moderate air entry with coarse breath sounds. No wheezing or crackles. CARDIOVASCULAR: Irregularly irregular. No murmurs, rubs, or gallops.  ABDOMEN: Soft, nontender, nondistended. Bowel sounds present. No organomegaly or mass.  EXTREMITIES: No pedal edema, cyanosis, or clubbing.  NEUROLOGIC: Patient is awake, alert and oriented, follows commands. Power 4/5 in all  extremities. PSYCHIATRIC: alert and awake, quiet. SKIN: No obvious rash, lesion, or ulcer.    LABORATORY PANEL:   CBC  Recent Labs Lab 02/08/15 0458  WBC 133.5*  HGB 9.6*  HCT 32.2*  PLT 136*   ------------------------------------------------------------------------------------------------------------------  Chemistries   Recent Labs Lab 02/01/15 1446  02/04/15 0457  02/08/15 0458  NA  --   < > 141  < > 139  K  --   < > 3.8  < > 4.8  CL  --   < > 106  < > 100*  CO2  --   < > 26  < > 32  GLUCOSE  --   < > 136*  < > 205*  BUN  --   < > 22*  < > 29*  CREATININE  --   < > 1.10  < > 1.01  CALCIUM  --   < > 8.7*  < > 9.0  MG 1.6*  --   --   --   --   AST  --   --  16  --   --   ALT  --   --  17  --   --   ALKPHOS  --   --  81  --   --   BILITOT  --   --  0.9  --   --   < > = values in this interval not displayed. ------------------------------------------------------------------------------------------------------------------  Cardiac Enzymes  Recent Labs Lab 02/01/15 1835  TROPONINI 0.09*   ------------------------------------------------------------------------------------------------------------------  RADIOLOGY:  No results found.    ASSESSMENT AND PLAN:   74 year old male with past medical history of CLL, hypothyroidism, depression, hyperlipidemia, history of prostate cancer, who  presented to the hospital due to shortness of breath, weakness, sputum production and noted to be in acute respiratory failure with hypoxia.  #1 acute respiratory failure with hypoxia- secondary to pneumonia. -Extubated 11/11, on high flow oxygen since extubation day #6 -Since he is immunosuppressed given his underlying malignancy treated with broad-spectrum IV antibiotics with vancomycin, Zosyn., Changed to Rocephin.  - blood CX NTD urine culture NTD sputum cultures with gram-positive cocci in pairs, group B strep sensitive to penicillin F/u CT scan of chest with contrast  today,  Encouraged to use Incentive spirometry and Flutter valve. Continue NEB. Continue Flonase for nasal stuffiness. Continue solumedrol iv q12h and NEB. Try to wean off HF O2. ID consult, change to Penicillin G, clindamycin and bactrim per Dr. Ola Spurr.  #A. fib with RVR Well controlled with amiodarone. Discontinued  amiodarone drip and patient is started on by mouth amiodarone. Per Dr. Ellyn Hack, Taper in approximately 1 week (11/20) to 200 mg bid x 1 week, followed by 200 mg daily thereafter. Not on anticoagulation at this time, will monitor closely as an inpatient.   # pneumonia-likely the cause of patient's acute respiratory failure with hypoxia.  on IV Rocephin and sputum is with gram-positive cocci- as above  # history of CLL-patient's white cell count is significantly elevated. -Appreciate oncology recommendations -continue to  hold his chemotherapy med given his acute infectious process. - No acute interventions.  #hypothyroidism-TSH is elevated .continue Synthroid at 125 g per day.  Need to check his TSH with PMD 1-2 weeks after recovery from the infection, as it may be high because of infection in hospital.  # hyperlipidemia-continue simvastatin.  # depression, continue Zoloft.   Discussed with Dr. Mortimer Fries. All the records are reviewed and case discussed with Care Management/Social Workerr. Management plans discussed with the patient's wife and daughter and they are in agreement. Greater than 50% time was spent on coordination of care and face-to-face counseling.  CODE STATUS: full code.  TOTAL CRITICAL TIME TAKING CARE OF THIS PATIENT: 46  minutes.   POSSIBLE D/C IN ?  DAYS, DEPENDING ON CLINICAL CONDITION.   Demetrios Loll M.D on 02/08/2015 at 8:44 AM  Between 7am to 6pm - Pager - [phone] After 6pm go to [email or web address] - password [email or web address] [facility]  Office  [phone]  CC: Primary care physician; Idelle Crouch, MD

## 2015-02-08 NOTE — Progress Notes (Signed)
PT Cancellation Note  Patient Details Name: Dean Collins MRN: [id number] DOB: 05/12/1940   Cancelled Treatment:    Reason Eval/Treat Not Completed: Medical issues which prohibited therapy (Discussed case with Dr. Mortimer Fries; recommends hold at this time due to respiratory status and poor activity tolerance.  Will re-attempt at later time/date as medically apporpirate.)   Lillie Bollig H. Owens Shark, PT, DPT, NCS 02/08/2015, 3:26 PM [phone]

## 2015-02-08 NOTE — Progress Notes (Signed)
CC: follow up SOB, hypoxia  HPI: Still very SOB,  Rattling cough Still requiring high flow [place] O2 fio2 85% Awaiting for CT chest pending Patient unable to tolerate laying down  Had a long discussion with patient and wife-plan to intubate and obtain Bronch and CT chest versus empiric abx regimen with ID consult Family to decide on plan of action within next 24 hrs   Filed Vitals:   02/08/15 0835 02/08/15 0900 02/08/15 1136 02/08/15 1200  BP:  116/76    Pulse:  72    Temp:    98.3 F (36.8 C)  TempSrc:    Oral  Resp:  20    Height:      Weight:      SpO2: 93% 92% 87%    Frail,  HEENT WNL No JVD noted No wheezes, diffuse coarse rhonchi IRIR, no M noted NABS, soft Ext warm, no edema    EXCELLENT SPECIMEN - 90-100% WBCS  MANY WBC SEEN  MANY GRAM POSITIVE COCCI IN PAIRS IN CHAINS  FEW GRAM NEGATIVE COCCOBACILLI       Culture RARE GROWTH GROUP A STREP (S.PYOGENES) ISOLATED              BMP Latest Ref Rng 02/08/2015 02/07/2015 02/06/2015  Glucose 65 - 99 mg/dL 205(H) 152(H) 138(H)  BUN 6 - 20 mg/dL 29(H) 24(H) 25(H)  Creatinine 0.61 - 1.24 mg/dL 1.01 1.05 0.95  Sodium 135 - 145 mmol/L 139 140 139  Potassium 3.5 - 5.1 mmol/L 4.8 3.7 3.3(L)  Chloride 101 - 111 mmol/L 100(L) 104 103  CO2 22 - 32 mmol/L 32 30 30  Calcium 8.9 - 10.3 mg/dL 9.0 8.6(L) 8.7(L)    CBC Latest Ref Rng 02/08/2015 02/07/2015 02/06/2015  WBC 3.8 - 10.6 K/uL 133.5(HH) 112.0(HH) 118.8(HH)  Hemoglobin 13.0 - 18.0 g/dL 9.6(L) 9.0(L) 9.8(L)  Hematocrit 40.0 - 52.0 % 32.2(L) 29.9(L) 31.9(L)  Platelets 150 - 440 K/uL 136(L) 126(L) 115(L)   Results for orders placed or performed during the hospital encounter of 01/29/2015  Blood Culture (routine x 2)     Status: None (Preliminary result)   Collection Time: 02/01/2015  5:12 PM  Result Value Ref Range Status   Specimen Description BLOOD RIGHT HAND  Final   Special Requests BOTTLES DRAWN AEROBIC AND ANAEROBIC [place]  Final   Culture NO GROWTH 5 DAYS   Final   Report Status PENDING  Incomplete  Blood Culture (routine x 2)     Status: None   Collection Time: 02/06/2015  5:13 PM  Result Value Ref Range Status   Specimen Description BLOOD RIGHT ARM  Final   Special Requests BOTTLES DRAWN AEROBIC AND ANAEROBIC [place]  Final   Culture NO GROWTH 5 DAYS  Final   Report Status 02/05/2015 FINAL  Final  Culture, sputum-assessment     Status: None   Collection Time: 01/26/2015  5:20 PM  Result Value Ref Range Status   Specimen Description SPUTUM  Final   Special Requests Immunocompromised  Final   Sputum evaluation THIS SPECIMEN IS ACCEPTABLE FOR SPUTUM CULTURE  Final   Report Status 02/13/2015 FINAL  Final  Urine culture     Status: None   Collection Time: 01/30/2015  5:20 PM  Result Value Ref Range Status   Specimen Description URINE, RANDOM  Final   Special Requests Immunocompromised  Final   Culture INSIGNIFICANT GROWTH  Final   Report Status 02/04/2015 FINAL  Final  Culture, respiratory (NON-Expectorated)     Status: None  Collection Time: 01/30/2015  5:20 PM  Result Value Ref Range Status   Specimen Description SPUTUM  Final   Special Requests Immunocompromised Reflexed from [id number]  Final   Gram Stain   Final    GOOD SPECIMEN - 80-90% WBCS MODERATE WBC SEEN MANY GRAM POSITIVE COCCI IN CHAINS IN CLUSTERS MANY GRAM NEGATIVE COCCOBACILLI    Culture   Final    HEAVY GROWTH GROUP A STREP (S.PYOGENES) ISOLATED There is no known Penicillin Resistant Beta Streptococcus in the [place] For patients that are Penicillin-allergic, Erythromycin is 85-94% susceptible, and Clindamycin is 80% susceptible.  Contact Microbiology within 7 days if sensitivity testing is  required.      Report Status 02/02/2015 FINAL  Final  MRSA PCR Screening     Status: None   Collection Time: 02/05/2015 10:47 PM  Result Value Ref Range Status   MRSA by PCR NEGATIVE NEGATIVE Final    Comment:        The GeneXpert MRSA Assay (FDA approved for NASAL specimens only), is one  component of a comprehensive MRSA colonization surveillance program. It is not intended to diagnose MRSA infection nor to guide or monitor treatment for MRSA infections.   Culture, respiratory (NON-Expectorated)     Status: None   Collection Time: 02/01/15  1:40 AM  Result Value Ref Range Status   Specimen Description TRACHEAL ASPIRATE  Final   Special Requests NONE  Final   Gram Stain   Final    EXCELLENT SPECIMEN - 90-100% WBCS MANY WBC SEEN MANY GRAM POSITIVE COCCI IN PAIRS IN CHAINS FEW GRAM NEGATIVE COCCOBACILLI    Culture   Final    RARE GROWTH GROUP A STREP (S.PYOGENES) ISOLATED There is no known Penicillin Resistant Beta Streptococcus in the [place] For patients that are Penicillin-allergic, Erythromycin is 85-94% susceptible, and Clindamycin is 80% susceptible.  Contact Microbiology within 7 days if sensitivity testing is  required.      Report Status 02/06/2015 FINAL  Final  Urine culture     Status: None   Collection Time: 02/02/15  2:35 PM  Result Value Ref Range Status   Specimen Description URINE, RANDOM  Final   Special Requests Immunocompromised  Final   Culture NO GROWTH 2 DAYS  Final   Report Status 02/04/2015 FINAL  Final   Anti-infectives    Start     Dose/Rate Route Frequency Ordered Stop   02/08/15 1200  sulfamethoxazole-trimethoprim (BACTRIM DS,SEPTRA DS) 800-160 MG per tablet 2 tablet     2 tablet Oral 3 times daily 02/08/15 1121     02/02/15 1500  cefTRIAXone (ROCEPHIN) 2 g in dextrose 5 % 50 mL IVPB     2 g 100 mL/hr over 30 Minutes Intravenous Every 24 hours 02/02/15 1424 02/09/15 2359   02/01/15 0700  vancomycin (VANCOCIN) IVPB 1000 mg/200 mL premix  Status:  Discontinued     1,000 mg 200 mL/hr over 60 Minutes Intravenous Every 12 hours 01/30/2015 2229 02/02/15 1042   01/25/2015 2245  piperacillin-tazobactam (ZOSYN) IVPB 4.5 g  Status:  Discontinued     4.5 g 25 mL/hr over 240 Minutes Intravenous 3 times per day 01/23/2015 2235 02/02/15 1424   02/02/2015  1800  vancomycin (VANCOCIN) IVPB 1000 mg/200 mL premix     1,000 mg 200 mL/hr over 60 Minutes Intravenous  Once 02/10/2015 1759 02/04/2015 2006   01/25/2015 1800  cefTRIAXone (ROCEPHIN) 1 g in dextrose 5 % 50 mL IVPB     1 g 100 mL/hr over 30  Minutes Intravenous  Once 02/16/2015 1759 01/27/2015 1952    TTE 11/11: LVEF 55 - 60%  CXR: RLL atelectasis/collapse   IMPRESSION: Acute respiratory failure-findings c/w ALI/ARDS RLL PNA RLL atelectasis New onset AF > NSR on amiodarone Hypokalemia, resolved Thrombocytopenia, improving CLL with severe leukocytosis   PLAN/REC: Cont SDU level of care Cont high flow O2 by [place] fio2 as tolerated Cont ceftriaxone for strep pyogens pneumonia Chest percussion as tolerated Albuterol nebs 11/13 to facilitate mucociliary function Flutter valve 11/13 Will stop amiodarone in setting of ALI Monitor BMET intermittently Monitor I/Os Correct electrolytes as indicated DVT px: enoxaparin Monitor CBC intermittently Transfuse per usual guidelines Xanax scheduled 1 mg TID for extreme anxiety-seems to be working well      The Patient requires high complexity decision making for assessment and support, frequent evaluation and titration of therapies. Plan for Intubation for BRonch/Ct chest vs. Empiric abx regimen and reassessment of resp status  Damaris Geers Patricia Pesa, M.D.  [facility]  Medical Director [place] Director [facility] Cardio-Pulmonary Department

## 2015-02-08 NOTE — Care Management (Addendum)
Patient is on higflow 02 at 80%.  Sats are maintained 90 - 92%.  There is discussion of code status: Patient does not wish to use bipap or be reintubated but he is a full code.  he does desat quickly with minimal exertion.

## 2015-02-08 NOTE — Progress Notes (Signed)
.   Dean Collins   DOB:1940/07/11   [id number]    CC: CLL hyperleukocytosis/respiratory failure  Subjective: Patient continues to be on high flow nasal oxygen-100%. In fact his oxygen requirements have gone up in the last 24 hours. He is unable to lie flat- to get the CT of the chest. He had been refusing BiPAP.  Review of systems: Is limited because of patient's respiratory distress.   Objective:  Filed Vitals:   02/08/15 0900  BP: 116/76  Pulse: 72  Temp:   Resp: 20     Intake/Output Summary (Last 24 hours) at 02/08/15 0911 Last data filed at 02/08/15 0736  Gross per 24 hour  Intake    360 ml  Output   2976 ml  Net  -2616 ml    GENERAL: Well-nourished well-developed; high flow nasal cannula oxygen. He is alone. EYES: Positive for pallor no icterus. NECK: supple, no masses felt LYMPH: no palpable lymphadenopathy in the cervical, axillary or inguinal regions LUNGS: Decreased air entry bilaterally. Bilateral coarse breath sounds. HEART/CVS: regular rate & rhythm and no murmurs; bilateral 1+ edema. ABDOMEN: abdomen soft, non-tender and normal bowel sounds Musculoskeletal:no cyanosis of digits and no clubbing  NEURO: no focal motor/sensory deficits SKIN: no rashes or significant lesions  Labs:  Lab Results  Component Value Date   WBC 133.5* 02/08/2015   HGB 9.6* 02/08/2015   HCT 32.2* 02/08/2015   MCV 96.9 02/08/2015   PLT 136* 02/08/2015   NEUTROABS 20.0* 02/08/2015    Lab Results  Component Value Date   NA 139 02/08/2015   K 4.8 02/08/2015   CL 100* 02/08/2015   CO2 32 02/08/2015    Studies:  No results found.  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  # Respiratory failure/status-unclear etiology; less likely from hyperleukocytosis/from CLL;  question related to atypical infection-PCP versus others.  #CLL- hyperleukocytosis/predominant lymphocytosis- today the white count is 133-improved since admission/hemoglobin stable at 9.6 and platelets stable at 136.  # A. Fib- currently in sinus rhythm/rate controlled on amiodarone.  Recommendations:  # Given the worsening respiratory status- I would recommend expanding the coverage of antibiotics to include- atypical infections like PCP/use of steroids. Also discussed at length with the patient's wife Dean Collins- that bronchoscopy/CT scan might be useful diagnostic tools. However there are concerns re- intubation. Also, recommend ID consultation.   # Continue to hold ibrutinib.   # discussed with Dr.Kasa. Discussed at length with patients wife.   Cammie Sickle, MD 02/08/2015  9:11 AM

## 2015-02-09 LAB — BASIC METABOLIC PANEL
Anion gap: 6 (ref 5–15)
BUN: 35 mg/dL — AB (ref 6–20)
CALCIUM: 8.6 mg/dL — AB (ref 8.9–10.3)
CHLORIDE: 99 mmol/L — AB (ref 101–111)
CO2: 30 mmol/L (ref 22–32)
CREATININE: 1.14 mg/dL (ref 0.61–1.24)
GFR calc non Af Amer: >60 mL/min (ref >60)
Glucose, Bld: 306 mg/dL — ABNORMAL HIGH (ref 65–99)
Potassium: 5.4 mmol/L — ABNORMAL HIGH (ref 3.5–5.1)
SODIUM: 135 mmol/L (ref 135–145)

## 2015-02-09 LAB — EXPECTORATED SPUTUM ASSESSMENT W REFEX TO RESP CULTURE

## 2015-02-09 LAB — MAGNESIUM: MAGNESIUM: 2.2 mg/dL (ref 1.7–2.4)

## 2015-02-09 LAB — GLUCOSE, CAPILLARY
GLUCOSE-CAPILLARY: 233 mg/dL — AB (ref 65–99)
Glucose-Capillary: 278 mg/dL — ABNORMAL HIGH (ref 65–99)
Glucose-Capillary: 175 mg/dL — ABNORMAL HIGH (ref 65–99)

## 2015-02-09 LAB — POTASSIUM: Potassium: 4.7 mmol/L (ref 3.5–5.1)

## 2015-02-09 LAB — EXPECTORATED SPUTUM ASSESSMENT W GRAM STAIN, RFLX TO RESP C

## 2015-02-09 LAB — PHOSPHORUS: Phosphorus: 4 mg/dL (ref 2.5–4.6)

## 2015-02-09 MED ORDER — INSULIN ASPART 100 UNIT/ML ~~LOC~~ SOLN
0.0000 [IU] | SUBCUTANEOUS | Status: DC
  Administered 2015-02-09: 11 [IU] via SUBCUTANEOUS
  Administered 2015-02-09: 7 [IU] via SUBCUTANEOUS
  Administered 2015-02-09: 4 [IU] via SUBCUTANEOUS
  Administered 2015-02-10: 3 [IU] via SUBCUTANEOUS
  Administered 2015-02-10 (×2): 4 [IU] via SUBCUTANEOUS
  Filled 2015-02-09: qty 7
  Filled 2015-02-09: qty 4
  Filled 2015-02-09: qty 3
  Filled 2015-02-09: qty 4
  Filled 2015-02-09: qty 11
  Filled 2015-02-09: qty 4

## 2015-02-09 MED ORDER — METHYLPREDNISOLONE SODIUM SUCC 40 MG IJ SOLR
40.0000 mg | Freq: Every day | INTRAMUSCULAR | Status: DC
  Administered 2015-02-10 – 2015-02-11 (×2): 40 mg via INTRAVENOUS
  Filled 2015-02-09 (×2): qty 1

## 2015-02-09 MED ORDER — INSULIN ASPART 100 UNIT/ML ~~LOC~~ SOLN: 2.0000 [IU] | SUBCUTANEOUS | Status: DC

## 2015-02-09 NOTE — Consult Note (Signed)
[facility] PROGRESS NOTE Date of Admission:  01/27/2015     ID: Dean Collins is a 74 y.o. male with CLL and Grp Strep A PNA  Principal Problem:   Acute respiratory failure with hypoxia (HCC) Active Problems:   CLL (chronic lymphocytic leukemia) (HCC)   Atrial fibrillation (HCC)   Right middle lobe pneumonia   Hypothyroidism   Pneumonia due to streptococcus, group A ([place])   Respiratory failure (HCC)   Paroxysmal atrial fibrillation (HCC)   Elevated troponin   Subjective: Still with some cough, no fevers, O2 down to abotu 65%  ROS  Eleven systems are reviewed and negative except per hpi  Medications:  Antibiotics Given (last 72 hours)    Date/Time Action Medication Dose Rate   02/06/15 1435 Given   cefTRIAXone (ROCEPHIN) 2 g in dextrose 5 % 50 mL IVPB 2 g 100 mL/hr   02/07/15 1439 Given   cefTRIAXone (ROCEPHIN) 2 g in dextrose 5 % 50 mL IVPB 2 g 100 mL/hr   02/08/15 1239 Given   sulfamethoxazole-trimethoprim (BACTRIM DS,SEPTRA DS) 800-160 MG per tablet 2 tablet 2 tablet    02/08/15 1544 Given   cefTRIAXone (ROCEPHIN) 2 g in dextrose 5 % 50 mL IVPB 2 g 100 mL/hr   02/08/15 1608 Given   sulfamethoxazole-trimethoprim (BACTRIM DS,SEPTRA DS) 800-160 MG per tablet 2 tablet 2 tablet    02/08/15 2122 Given   penicillin G potassium 4 Million Units in dextrose 5 % 250 mL IVPB 4 Million Units 250 mL/hr   02/08/15 2235 Given   sulfamethoxazole-trimethoprim (BACTRIM DS,SEPTRA DS) 800-160 MG per tablet 2 tablet 2 tablet    02/08/15 2235 Given   clindamycin (CLEOCIN) IVPB 900 mg 900 mg 100 mL/hr   02/09/15 0023 Given   penicillin G potassium 4 Million Units in dextrose 5 % 250 mL IVPB 4 Million Units 250 mL/hr   02/09/15 [id number] Given   penicillin G potassium 4 Million Units in dextrose 5 % 250 mL IVPB 4 Million Units 250 mL/hr   02/09/15 0831 Given   clindamycin (CLEOCIN) IVPB 900 mg 900 mg 100 mL/hr   02/09/15 1259 Given   sulfamethoxazole-trimethoprim (BACTRIM  DS,SEPTRA DS) 800-160 MG per tablet 2 tablet 2 tablet    02/09/15 1259 Given   penicillin G potassium 4 Million Units in dextrose 5 % 250 mL IVPB 4 Million Units 250 mL/hr     . albuterol  2.5 mg Nebulization Q4H  . ALPRAZolam  1 mg Oral TID  . antiseptic oral rinse  7 mL Mouth Rinse QID  . budesonide (PULMICORT) nebulizer solution  0.25 mg Nebulization BID  . chlorhexidine gluconate  15 mL Mouth Rinse BID  . clindamycin (CLEOCIN) IV  900 mg Intravenous 3 times per day  . enoxaparin (LOVENOX) injection  40 mg Subcutaneous Q24H  . fluticasone  2 spray Each Nare Daily  . guaiFENesin  600 mg Oral BID  . Immune Globulin 10%  400 mg/kg Intravenous Q24 Hr x 5  . insulin aspart  2-6 Units Subcutaneous 6 times per day  . levothyroxine  125 mcg Oral QAC breakfast  . [START ON 02/10/2015] methylPREDNISolone (SOLU-MEDROL) injection  40 mg Intravenous Daily  . pencillin G potassium IV  4 Million Units Intravenous 6 times per day  . sertraline  50 mg Oral QHS  . sodium chloride  3 mL Intravenous Q12H  . sulfamethoxazole-trimethoprim  2 tablet Oral TID    Objective: Vital signs in last 24 hours: Temp:  [  97.5 F (36.4 C)-98.4 F (36.9 C)] 97.9 F (36.6 C) (11/18 1100) Pulse Rate:  [63-85] 74 (11/18 1100) Resp:  [15-26] 17 (11/18 1100) BP: (124-152)/(46-122) 125/56 mmHg (11/18 1000) SpO2:  [86 %-97 %] 91 % (11/18 1100) FiO2 (%):  [74.7 %-87 %] 74.7 % (11/18 0918) Constitutional: He is obese, in resp distress on 65% O2, drowsy but arousable HENT: perrla Mouth/Throat: Oropharynx is clear and dry. No oropharyngeal exudate.  Cardiovascular: Tachy.  Pulmonary/Chest: poor air movement , rhonchi throughout.  Abdominal: Soft. Bowel sounds are normal. He exhibits no distension. There is no tenderness. .  Neurological: He is alert and oriented to person, place, and time.  Skin: Skin is warm and dry. No rash noted. No erythema.  Psychiatric: He has a normal mood and affect. His behavior is  normal.   Lab Results  Recent Labs  02/07/15 0515 02/08/15 0458 02/09/15 0531 02/09/15 1124  WBC 112.0* 133.5*  --   --   HGB 9.0* 9.6*  --   --   HCT 29.9* 32.2*  --   --   NA 140 139 135  --   K 3.7 4.8 5.4* 4.7  CL 104 100* 99*  --   CO2 30 32 30  --   BUN 24* 29* 35*  --   CREATININE 1.05 1.01 1.14  --     Microbiology: Results for orders placed or performed during the hospital encounter of 02/12/2015  Blood Culture (routine x 2)     Status: None (Preliminary result)   Collection Time: 02/10/2015  5:12 PM  Result Value Ref Range Status   Specimen Description BLOOD RIGHT HAND  Final   Special Requests BOTTLES DRAWN AEROBIC AND ANAEROBIC [place]  Final   Culture NO GROWTH 5 DAYS  Final   Report Status PENDING  Incomplete  Blood Culture (routine x 2)     Status: None   Collection Time: 02/21/2015  5:13 PM  Result Value Ref Range Status   Specimen Description BLOOD RIGHT ARM  Final   Special Requests BOTTLES DRAWN AEROBIC AND ANAEROBIC [place]  Final   Culture NO GROWTH 5 DAYS  Final   Report Status 02/05/2015 FINAL  Final  Culture, sputum-assessment     Status: None   Collection Time: 01/27/2015  5:20 PM  Result Value Ref Range Status   Specimen Description SPUTUM  Final   Special Requests Immunocompromised  Final   Sputum evaluation THIS SPECIMEN IS ACCEPTABLE FOR SPUTUM CULTURE  Final   Report Status 01/29/2015 FINAL  Final  Urine culture     Status: None   Collection Time: 02/08/2015  5:20 PM  Result Value Ref Range Status   Specimen Description URINE, RANDOM  Final   Special Requests Immunocompromised  Final   Culture INSIGNIFICANT GROWTH  Final   Report Status 02/04/2015 FINAL  Final  Culture, respiratory (NON-Expectorated)     Status: None   Collection Time: 01/24/2015  5:20 PM  Result Value Ref Range Status   Specimen Description SPUTUM  Final   Special Requests Immunocompromised Reflexed from [id number]  Final   Gram Stain   Final    GOOD SPECIMEN - 80-90% WBCS MODERATE WBC  SEEN MANY GRAM POSITIVE COCCI IN CHAINS IN CLUSTERS MANY GRAM NEGATIVE COCCOBACILLI    Culture   Final    HEAVY GROWTH GROUP A STREP (S.PYOGENES) ISOLATED There is no known Penicillin Resistant Beta Streptococcus in the [place] For patients that are Penicillin-allergic, Erythromycin is 85-94% susceptible, and Clindamycin is  80% susceptible.  Contact Microbiology within 7 days if sensitivity testing is  required.      Report Status 02/02/2015 FINAL  Final  MRSA PCR Screening     Status: None   Collection Time: 01/23/2015 10:47 PM  Result Value Ref Range Status   MRSA by PCR NEGATIVE NEGATIVE Final    Comment:        The GeneXpert MRSA Assay (FDA approved for NASAL specimens only), is one component of a comprehensive MRSA colonization surveillance program. It is not intended to diagnose MRSA infection nor to guide or monitor treatment for MRSA infections.   Culture, respiratory (NON-Expectorated)     Status: None   Collection Time: 2015/02/17  1:40 AM  Result Value Ref Range Status   Specimen Description TRACHEAL ASPIRATE  Final   Special Requests NONE  Final   Gram Stain   Final    EXCELLENT SPECIMEN - 90-100% WBCS MANY WBC SEEN MANY GRAM POSITIVE COCCI IN PAIRS IN CHAINS FEW GRAM NEGATIVE COCCOBACILLI    Culture   Final    RARE GROWTH GROUP A STREP (S.PYOGENES) ISOLATED There is no known Penicillin Resistant Beta Streptococcus in the [place] For patients that are Penicillin-allergic, Erythromycin is 85-94% susceptible, and Clindamycin is 80% susceptible.  Contact Microbiology within 7 days if sensitivity testing is  required.      Report Status 02/06/2015 FINAL  Final  Urine culture     Status: None   Collection Time: 02/02/15  2:35 PM  Result Value Ref Range Status   Specimen Description URINE, RANDOM  Final   Special Requests Immunocompromised  Final   Culture NO GROWTH 2 DAYS  Final   Report Status 02/04/2015 FINAL  Final  Culture, expectorated sputum-assessment     Status:  None   Collection Time: 02/09/15 11:24 AM  Result Value Ref Range Status   Specimen Description EXPECTORATED SPUTUM  Final   Special Requests Immunocompromised  Final   Sputum evaluation THIS SPECIMEN IS ACCEPTABLE FOR SPUTUM CULTURE  Final   Report Status 02/09/2015 FINAL  Final    Studies/Results:  Dg Abd 1 View  2015-02-17  CLINICAL DATA:  Respiratory failure EXAM: ABDOMEN - 1 VIEW COMPARISON:  02/08/2015 FINDINGS: Normal bowel gas pattern. Negative for obstruction or ileus. NG tube in the stomach. No renal calculi Right hip replacement. Surgical clips in the pelvis from prior prostatectomy. Penile prosthesis reservoir in the left lower quadrant. IMPRESSION: No acute abnormality. Electronically Signed   By: Franchot Gallo M.D.   On: 2015/02/17 09:03   Dg Abd 1 View  01/24/2015  CLINICAL DATA:  Endotracheal tube placement. EXAM: PORTABLE CHEST 1 VIEW COMPARISON:  CT scan 04/20/2014. FINDINGS: Chest x-ray: The endotracheal tube is 3 cm above the carina. The NG tube is coursing down the esophagus and into the stomach. A left subclavian power port is noted. The tip is in the mid SVC. The heart is upper limits of normal in size. Moderate elevation of the right hemidiaphragm with overlying vascular crowding and atelectasis. No edema or definite infiltrates. No large effusions. Abdomen: The NG tube tip is in the antral region of the stomach. There is scattered air in the small bowel and colon but no definite findings for obstruction or perforation. A right hip prosthesis is noted. A reservoir for a penile prosthesis is noted on the left side. Advanced degenerative changes involving the lower lumbar spine. IMPRESSION: 1. The endotracheal tube and NG tubes are in good position. 2. No significant pulmonary findings.  3. No plain film findings for an acute abdominal process. Possible mild ileus. Electronically Signed   By: Marijo Sanes M.D.   On: 01/25/2015 22:49   Dg Chest Port 1 View  02/06/2015   CLINICAL DATA:  Right middle lobe pneumonia EXAM: PORTABLE CHEST - 1 VIEW COMPARISON:  02/05/2015 FINDINGS: Cardiac shadow remains enlarged. A left-sided chest wall port is again seen and stable. Persistent right basilar changes are noted. Small left-sided pleural effusion is noted as well. IMPRESSION: The overall appearance is stable from the prior exam. Electronically Signed   By: Inez Catalina M.D.   On: 02/06/2015 07:52   Dg Chest Port 1 View  02/05/2015  CLINICAL DATA:  Respiratory failure EXAM: PORTABLE CHEST 1 VIEW COMPARISON:  02/04/2015 FINDINGS: Moderate right effusion with right lower lobe atelectasis unchanged. Mild left lower lobe airspace disease and small left effusion slightly increased from yesterday Negative for heart failure or edema. Port-A-Cath tip in the SVC unchanged IMPRESSION: Right lower lobe atelectasis and effusion unchanged Mild progression in left lower lobe airspace disease and small left effusion. Electronically Signed   By: Franchot Gallo M.D.   On: 02/05/2015 07:36   Dg Chest Port 1 View  02/04/2015  CLINICAL DATA:  Respiratory failure. History of prostate carcinoma. Right middle lobe pneumonia. EXAM: PORTABLE CHEST 1 VIEW COMPARISON:  02/02/2015 FINDINGS: Since prior study, the patient has been extubated. The orogastric tube has been removed. Left-sided Port-A-Cath is stable with its tip in the lower superior vena cava. There is opacity at the right base obscuring the hemidiaphragm, which has increased when compared to the prior study. This is consistent with pneumonia, atelectasis or a combination. There is mild probable atelectasis at the medial left lung base, similar to the prior exam. Lung volumes are low accentuating the bronchovascular markings. No overt pulmonary edema. IMPRESSION: 1. Increased opacity at the right lung base when compared to the previous exam. This may be due to worsening pneumonia, new or worsened atelectasis or a combination. 2. Status post  extubation and removal of the orogastric tube. No other change. Electronically Signed   By: Lajean Manes M.D.   On: 02/04/2015 07:52   Dg Chest Port 1 View  02/02/2015  CLINICAL DATA:  Respiratory failure. EXAM: PORTABLE CHEST 1 VIEW COMPARISON:  02/01/2015. FINDINGS: Endotracheal tube, Port-A-Cath, NG tube in stable position. Stable cardiomegaly. Low lung volumes with bibasilar atelectasis. Mild infiltrate in the right lung base again cannot be excluded. No pleural effusion or pneumothorax . IMPRESSION: 1. Lines and tubes in stable position. 2.  Stable cardiomegaly. 3. Low lung volumes with stable bibasilar atelectasis. Mild infiltrate right lung base again cannot be excluded. No interim change. Electronically Signed   By: House   On: 02/02/2015 07:07   Dg Chest Port 1 View  02/01/2015  CLINICAL DATA:  Shortness of breath.  Respiratory failure . EXAM: PORTABLE CHEST 1 VIEW COMPARISON:  02/16/2015. FINDINGS: Endotracheal tube and NG tube stable position. Port-A-Cath in stable position. Low lung volumes with mild basilar atelectasis again noted. Mild infiltrate right lung base cannot be excluded. Stable cardiomegaly. No pleural effusion or pneumothorax . IMPRESSION: 1. Lines and tubes in stable position. 2. Stable cardiomegaly. 3. Low lung volumes with stable bibasilar atelectasis. Mild infiltrate right lung base cannot be excluded . Electronically Signed   By: Marcello Moores  Register   On: 02/01/2015 08:26   Dg Chest Port 1 View  02/05/2015  CLINICAL DATA:  Endotracheal tube placement. EXAM: PORTABLE CHEST 1  VIEW COMPARISON:  CT scan 04/20/2014. FINDINGS: Chest x-ray: The endotracheal tube is 3 cm above the carina. The NG tube is coursing down the esophagus and into the stomach. A left subclavian power port is noted. The tip is in the mid SVC. The heart is upper limits of normal in size. Moderate elevation of the right hemidiaphragm with overlying vascular crowding and atelectasis. No edema or  definite infiltrates. No large effusions. Abdomen: The NG tube tip is in the antral region of the stomach. There is scattered air in the small bowel and colon but no definite findings for obstruction or perforation. A right hip prosthesis is noted. A reservoir for a penile prosthesis is noted on the left side. Advanced degenerative changes involving the lower lumbar spine. IMPRESSION: 1. The endotracheal tube and NG tubes are in good position. 2. No significant pulmonary findings. 3. No plain film findings for an acute abdominal process. Possible mild ileus. Electronically Signed   By: Marijo Sanes M.D.   On: 02/06/2015 22:49   Dg Chest Port 1 View  01/24/2015  CLINICAL DATA:  Shortness of breath and cough for 1 day EXAM: PORTABLE CHEST 1 VIEW COMPARISON:  Aug 14, 2014 FINDINGS: There is patchy infiltrate in the medial right base. The lungs elsewhere clear. Heart is upper normal in size with pulmonary vascularity within normal limits. No adenopathy. Port-A-Cath tip is in the superior vena cava. No pneumothorax. IMPRESSION: Patchy infiltrate medial right base. Lungs elsewhere clear. No change in cardiac silhouette. Electronically Signed   By: Lowella Grip III M.D.   On: 02/01/2015 17:44    Assessment/Plan: Dean Collins is a 74 y.o. 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 Initially improved but clinically worsening from resp viewpoint.  Grp A strep PNA is quite severe with high mortality and the patient is particularly immunocompromised so that could explain his worsening course. However is at high risk of OIs including PCP, fungal and viral pneumonias. Has low IGG levels  Recommendations Cont  pcn and clindamycin Continue bactrim Cont  IVIG since it is recommended in patients with invasive Grp A strep infections and in CLL with low IG levels Discussed with patient, family and Dr Mortimer Fries  Thank you very much for the consult. Will follow with  you.  [place], [place]   02/09/2015, 1:22 PM

## 2015-02-09 NOTE — Progress Notes (Signed)
Nutrition Follow-up   INTERVENTION:   Meals and Snacks: Cater to patient preferences Medical Food Supplement Therapy: continue Mighty Shakes  NUTRITION DIAGNOSIS:   Inadequate oral intake related to acute illness as evidenced by NPO status. Being addressed as pt tolerating diet, eating  GOAL:   Patient will meet greater than or equal to 90% of their needs  MONITOR:    (Energy Intake, Anthropometrics, Digestive System, Pulmonary, Electrolyte/Renal Profile)  REASON FOR ASSESSMENT:   Consult, Ventilator Enteral/tube feeding initiation and management  ASSESSMENT:    Pt continues on HFNC, SOB with minimal exertion, pt with Grp A strep pneumonia with I and D following; pt with CLL with severe leukocytosis  Diet Order:  DIET DYS 3 Room service appropriate?: Yes; Fluid consistency:: Thin   Energy Intake: recorded po intake 83% on average, receiving Mighty Shake supplements  Skin:  Reviewed, no issues  Last BM:  11/18 large BM   Electrolyte and Renal Profile:  Recent Labs Lab 02/07/15 0515 02/08/15 0458 02/09/15 0531 02/09/15 1124  BUN 24* 29* 35*  --   CREATININE 1.05 1.01 1.14  --   NA 140 139 135  --   K 3.7 4.8 5.4* 4.7  MG  --   --  2.2  --   PHOS  --   --  4.0  --    Glucose Profile:  Recent Labs  02/09/15 1232  GLUCAP 278*   Nutritional Anemia Profile:  CBC Latest Ref Rng 02/08/2015 02/07/2015 02/06/2015  WBC 3.8 - 10.6 K/uL 133.5(HH) 112.0(HH) 118.8(HH)  Hemoglobin 13.0 - 18.0 g/dL 9.6(L) 9.0(L) 9.8(L)  Hematocrit 40.0 - 52.0 % 32.2(L) 29.9(L) 31.9(L)  Platelets 150 - 440 K/uL 136(L) 126(L) 115(L)    Meds: ss novolog, solumedrol, magic moutwash  Height:   Ht Readings from Last 1 Encounters:  02/06/2015 5\' 10"  (1.778 m)    Weight:   Wt Readings from Last 1 Encounters:  02/04/15 240 lb 1.3 oz (108.9 kg)   Filed Weights   02/02/15 0426 02/03/15 0500 02/04/15 0459  Weight: 238 lb 5.1 oz (108.1 kg) 238 lb 1.6 oz (108 kg) 240 lb 1.3 oz (108.9  kg)    BMI:  Body mass index is 34.45 kg/(m^2).  Estimated Nutritional Needs:   Kcal:  [id number] kcals (11-14 kcals/kg)   Protein:  150 g (2.0 g/kg) using IBW 75 kg  Fluid:  1875-2250 mL (25-30 ml/kg)   MODERATE Care Level  Kerman Passey MS, RD, LDN [phone] Pager

## 2015-02-09 NOTE — Progress Notes (Addendum)
[facility] NAME: Dean Collins    MR#:  [id number]  DATE OF BIRTH:  [birth year]  SUBJECTIVE:  CHIEF COMPLAINT:  Patient is extubated 11/11 and on  Still on high flow oxygen with SAT 89%.  SOB but no wheezing.   REVIEW OF SYSTEMS:   CONSTITUTIONAL: No fever, has weakness.  EYES: No blurred or double vision.  EARS, NOSE, AND THROAT: No tinnitus or ear pain. Stuffy nose. RESPIRATORY: has cough and shortness of breath, no wheezing, no hemoptysis.  CARDIOVASCULAR: No chest pain, orthopnea, edema.  GASTROINTESTINAL: No nausea, vomiting, diarrhea or abdominal pain.  GENITOURINARY: No dysuria, hematuria.  ENDOCRINE: No polyuria, nocturia,  HEMATOLOGY: No anemia, easy bruising or bleeding SKIN: No rash or lesion. MUSCULOSKELETAL: No joint pain or arthritis.  NEUROLOGIC: No tingling, numbness, weakness.  PSYCHIATRY: No anxiety or depression.    DRUG ALLERGIES:  No Known Allergies  VITALS:  Blood pressure 135/73, pulse 70, temperature 97.9 F (36.6 C), temperature source Oral, resp. rate 21, height 5\' 10"  (1.778 m), weight 108.9 kg (240 lb 1.3 oz), SpO2 90 %.  PHYSICAL EXAMINATION:  GENERAL:  74 y.o.-year-old patient lying in the bed with no acute distress.  EYES: Pupils equal, round, reactive to light and accommodation. No scleral icterus. HEENT: Head atraumatic, normocephalic.  Wheezing sound present through his nose. NECK:  Supple, no jugular venous distention. No thyroid enlargement, no tenderness.  LUNGS: On high flow oxygen. Moderate air entry with coarse breath sounds. No wheezing or crackles. CARDIOVASCULAR: Irregularly irregular. No murmurs, rubs, or gallops.  ABDOMEN: Soft, nontender, nondistended. Bowel sounds present. No organomegaly or mass.  EXTREMITIES: No pedal edema, cyanosis, or clubbing.  NEUROLOGIC: Patient is awake, alert and oriented, follows commands. Power 4/5 in all  extremities. PSYCHIATRIC: alert and awake, quiet. SKIN: No obvious rash, lesion, or ulcer.    LABORATORY PANEL:   CBC  Recent Labs Lab 02/08/15 0458  WBC 133.5*  HGB 9.6*  HCT 32.2*  PLT 136*   ------------------------------------------------------------------------------------------------------------------  Chemistries   Recent Labs Lab 02/04/15 0457  02/09/15 0531  NA 141  < > 135  K 3.8  < > 5.4*  CL 106  < > 99*  CO2 26  < > 30  GLUCOSE 136*  < > 306*  BUN 22*  < > 35*  CREATININE 1.10  < > 1.14  CALCIUM 8.7*  < > 8.6*  MG  --   --  2.2  AST 16  --   --   ALT 17  --   --   ALKPHOS 81  --   --   BILITOT 0.9  --   --   < > = values in this interval not displayed. ------------------------------------------------------------------------------------------------------------------  Cardiac Enzymes No results for input(s): TROPONINI in the last 168 hours. ------------------------------------------------------------------------------------------------------------------  RADIOLOGY:  No results found.    ASSESSMENT AND PLAN:   74 year old male with past medical history of CLL, hypothyroidism, depression, hyperlipidemia, history of prostate cancer, who presented to the hospital due to shortness of breath, weakness, sputum production and noted to be in acute respiratory failure with hypoxia.  #1 acute respiratory failure with hypoxia- secondary to pneumonia. -Extubated 11/11, on high flow oxygen since extubation day #6 -Since he is immunosuppressed given his underlying malignancy treated with broad-spectrum IV antibiotics with vancomycin, Zosyn., Changed to Rocephin.  - blood CX NTD urine culture NTD sputum cultures with gram-positive cocci in pairs, group B strep sensitive to penicillin  F/u CT scan of chest with contrast today,  Encouraged to use Incentive spirometry and Flutter valve. Continue NEB. Continue Flonase for nasal stuffiness. Continue solumedrol iv  q12h and NEB. Try to wean off HF O2. Changed to Penicillin G, clindamycin and bactrim, will give IVIG if OK with oncologist per Dr. Ola Spurr.  #A. fib with RVR Well controlled with amiodarone. Discontinued  amiodarone drip and patient is started on by mouth amiodarone. Per Dr. Ellyn Hack, Taper in approximately 1 week (11/20) to 200 mg bid x 1 week, followed by 200 mg daily thereafter. Not on anticoagulation at this time, will monitor closely as an inpatient. Amiodarone in setting of ALI was discontinued per Dr. Mortimer Fries.  # pneumonia-likely the cause of patient's acute respiratory failure with hypoxia.  Was on IV Rocephin and sputum is with gram-positive cocci- as above. Changed to Penicillin G, clindamycin and bactrim as above.  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 -Appreciate oncology recommendations -continue to  hold his chemotherapy med given his acute infectious process.  #hypothyroidism-TSH is elevated .continue Synthroid at 125 g per day.  Need to check his TSH with PMD 1-2 weeks after recovery from the infection, as it may be high because of infection in hospital.  # hyperlipidemia-continue simvastatin.  # depression, continue Zoloft.   Discussed with Dr. Mortimer Fries. All the records are reviewed and case discussed with Care Management/Social Workerr. Management plans discussed with the patient's wife and daughter and they are in agreement. Greater than 50% time was spent on coordination of care and face-to-face counseling.  CODE STATUS: full code.  TOTAL CRITICAL TIME TAKING CARE OF THIS PATIENT: 47 minutes.   POSSIBLE D/C IN ?  DAYS, DEPENDING ON CLINICAL CONDITION.   Demetrios Loll M.D on 02/09/2015 at 8:22 AM  Between 7am to 6pm - Pager - [phone] After 6pm go to [email or web address] - password [email or web address]  Office  [phone]  CC: Primary care physician; Idelle Crouch, MD

## 2015-02-09 NOTE — Progress Notes (Signed)
CC: follow up SOB, hypoxia  HPI: Still very SOB,  Rattling cough Still requiring high flow [place] O2 fio2 75% Patient unable to tolerate laying down Appreciate ID input  Aggressive abx therapy   Filed Vitals:   02/09/15 0100 02/09/15 0200 02/09/15 0300 02/09/15 0400  BP: 131/68 137/70 130/65 135/73  Pulse: 65 65 64 70  Temp:      TempSrc:      Resp: 24 22 25 21   Height:      Weight:      SpO2: 93% 95% 90% 90%   Frail,  HEENT WNL No JVD noted No wheezes, diffuse coarse rhonchi s1s2 noted no M noted NABS, soft Ext warm, no edema    EXCELLENT SPECIMEN - 90-100% WBCS  MANY WBC SEEN  MANY GRAM POSITIVE COCCI IN PAIRS IN CHAINS  FEW GRAM NEGATIVE COCCOBACILLI       Culture RARE GROWTH GROUP A STREP (S.PYOGENES) ISOLATED              BMP Latest Ref Rng 02/09/2015 02/08/2015 02/07/2015  Glucose 65 - 99 mg/dL 306(H) 205(H) 152(H)  BUN 6 - 20 mg/dL 35(H) 29(H) 24(H)  Creatinine 0.61 - 1.24 mg/dL 1.14 1.01 1.05  Sodium 135 - 145 mmol/L 135 139 140  Potassium 3.5 - 5.1 mmol/L 5.4(H) 4.8 3.7  Chloride 101 - 111 mmol/L 99(L) 100(L) 104  CO2 22 - 32 mmol/L 30 32 30  Calcium 8.9 - 10.3 mg/dL 8.6(L) 9.0 8.6(L)    CBC Latest Ref Rng 02/08/2015 02/07/2015 02/06/2015  WBC 3.8 - 10.6 K/uL 133.5(HH) 112.0(HH) 118.8(HH)  Hemoglobin 13.0 - 18.0 g/dL 9.6(L) 9.0(L) 9.8(L)  Hematocrit 40.0 - 52.0 % 32.2(L) 29.9(L) 31.9(L)  Platelets 150 - 440 K/uL 136(L) 126(L) 115(L)   Results for orders placed or performed during the hospital encounter of 02/16/2015  Blood Culture (routine x 2)     Status: None (Preliminary result)   Collection Time: 02/14/2015  5:12 PM  Result Value Ref Range Status   Specimen Description BLOOD RIGHT HAND  Final   Special Requests BOTTLES DRAWN AEROBIC AND ANAEROBIC [place]  Final   Culture NO GROWTH 5 DAYS  Final   Report Status PENDING  Incomplete  Blood Culture (routine x 2)     Status: None   Collection Time: 01/30/2015  5:13 PM  Result Value Ref Range Status    Specimen Description BLOOD RIGHT ARM  Final   Special Requests BOTTLES DRAWN AEROBIC AND ANAEROBIC [place]  Final   Culture NO GROWTH 5 DAYS  Final   Report Status 02/05/2015 FINAL  Final  Culture, sputum-assessment     Status: None   Collection Time: 01/25/2015  5:20 PM  Result Value Ref Range Status   Specimen Description SPUTUM  Final   Special Requests Immunocompromised  Final   Sputum evaluation THIS SPECIMEN IS ACCEPTABLE FOR SPUTUM CULTURE  Final   Report Status 01/30/2015 FINAL  Final  Urine culture     Status: None   Collection Time: 02/18/2015  5:20 PM  Result Value Ref Range Status   Specimen Description URINE, RANDOM  Final   Special Requests Immunocompromised  Final   Culture INSIGNIFICANT GROWTH  Final   Report Status 02/04/2015 FINAL  Final  Culture, respiratory (NON-Expectorated)     Status: None   Collection Time: 01/30/2015  5:20 PM  Result Value Ref Range Status   Specimen Description SPUTUM  Final   Special Requests Immunocompromised Reflexed from [id number]  Final   Gram Stain  Final    GOOD SPECIMEN - 80-90% WBCS MODERATE WBC SEEN MANY GRAM POSITIVE COCCI IN CHAINS IN CLUSTERS MANY GRAM NEGATIVE COCCOBACILLI    Culture   Final    HEAVY GROWTH GROUP A STREP (S.PYOGENES) ISOLATED There is no known Penicillin Resistant Beta Streptococcus in the [place] For patients that are Penicillin-allergic, Erythromycin is 85-94% susceptible, and Clindamycin is 80% susceptible.  Contact Microbiology within 7 days if sensitivity testing is  required.      Report Status 02/02/2015 FINAL  Final  MRSA PCR Screening     Status: None   Collection Time: 01/29/2015 10:47 PM  Result Value Ref Range Status   MRSA by PCR NEGATIVE NEGATIVE Final    Comment:        The GeneXpert MRSA Assay (FDA approved for NASAL specimens only), is one component of a comprehensive MRSA colonization surveillance program. It is not intended to diagnose MRSA infection nor to guide or monitor treatment  for MRSA infections.   Culture, respiratory (NON-Expectorated)     Status: None   Collection Time: 02/01/15  1:40 AM  Result Value Ref Range Status   Specimen Description TRACHEAL ASPIRATE  Final   Special Requests NONE  Final   Gram Stain   Final    EXCELLENT SPECIMEN - 90-100% WBCS MANY WBC SEEN MANY GRAM POSITIVE COCCI IN PAIRS IN CHAINS FEW GRAM NEGATIVE COCCOBACILLI    Culture   Final    RARE GROWTH GROUP A STREP (S.PYOGENES) ISOLATED There is no known Penicillin Resistant Beta Streptococcus in the [place] For patients that are Penicillin-allergic, Erythromycin is 85-94% susceptible, and Clindamycin is 80% susceptible.  Contact Microbiology within 7 days if sensitivity testing is  required.      Report Status 02/06/2015 FINAL  Final  Urine culture     Status: None   Collection Time: 02/02/15  2:35 PM  Result Value Ref Range Status   Specimen Description URINE, RANDOM  Final   Special Requests Immunocompromised  Final   Culture NO GROWTH 2 DAYS  Final   Report Status 02/04/2015 FINAL  Final   Anti-infectives    Start     Dose/Rate Route Frequency Ordered Stop   02/08/15 2200  clindamycin (CLEOCIN) IVPB 900 mg     900 mg 100 mL/hr over 30 Minutes Intravenous 3 times per day 02/08/15 1803     02/08/15 2000  penicillin G potassium 4 Million Units in dextrose 5 % 250 mL IVPB     4 Million Units 250 mL/hr over 60 Minutes Intravenous 6 times per day 02/08/15 1803     02/08/15 1200  sulfamethoxazole-trimethoprim (BACTRIM DS,SEPTRA DS) 800-160 MG per tablet 2 tablet     2 tablet Oral 3 times daily 02/08/15 1121     02/02/15 1500  cefTRIAXone (ROCEPHIN) 2 g in dextrose 5 % 50 mL IVPB  Status:  Discontinued     2 g 100 mL/hr over 30 Minutes Intravenous Every 24 hours 02/02/15 1424 02/08/15 1803   02/01/15 0700  vancomycin (VANCOCIN) IVPB 1000 mg/200 mL premix  Status:  Discontinued     1,000 mg 200 mL/hr over 60 Minutes Intravenous Every 12 hours 02/09/2015 2229 02/02/15 1042    02/12/2015 2245  piperacillin-tazobactam (ZOSYN) IVPB 4.5 g  Status:  Discontinued     4.5 g 25 mL/hr over 240 Minutes Intravenous 3 times per day 02/03/2015 2235 02/02/15 1424   02/19/2015 1800  vancomycin (VANCOCIN) IVPB 1000 mg/200 mL premix     1,000 mg  200 mL/hr over 60 Minutes Intravenous  Once 02/01/2015 1759 02/18/2015 2006   02/12/2015 1800  cefTRIAXone (ROCEPHIN) 1 g in dextrose 5 % 50 mL IVPB     1 g 100 mL/hr over 30 Minutes Intravenous  Once 02/15/2015 1759 02/10/2015 1952    TTE 11/11: LVEF 55 - 60%  CXR: RLL atelectasis/collapse   IMPRESSION: Acute respiratory failure-findings c/w ALI/ARDS with Strep Pyogenes pneumonia RLL PNA RLL atelectasis New onset AF > NSR on amiodarone Hypokalemia, resolved Thrombocytopenia, improving CLL with severe leukocytosis   PLAN/REC: Cont SDU level of care Cont high flow O2 by [place]-wean fio2 as tolerated Continue abx as per ID for strep pyogens pneumonia Chest percussion as tolerated Albuterol nebs 11/13 to facilitate mucociliary function Flutter valve 11/13 Will stop amiodarone in setting of ALI Monitor BMET intermittently Monitor I/Os Correct electrolytes as indicated DVT px: enoxaparin Monitor CBC intermittently Transfuse per usual guidelines Xanax scheduled 1 mg TID for extreme anxiety-seems to be working well   The Patient requires high complexity decision making for assessment and support, frequent evaluation and titration of therapies.   Corrin Parker, M.D.  [facility]  Medical Director [place] Director [facility] Cardio-Pulmonary Department

## 2015-02-09 NOTE — Progress Notes (Signed)
.   Dean Collins   DOB:09-28-1940   [id number]    CC: CLL hyperleukocytosis/respiratory failure  Subjective: Patient continues to be on high flow nasal oxygen-however 65-75%; down from 100% yesterday. Yesterday he had been started on steroids/Bactrim.   Patient still complained of coughing.As per the wife, patient seems to do a little better. Appetite is improving. He is eating better.   Review of systems: Is limited because of patient's respiratory distress.   Objective:  Filed Vitals:   02/09/15 0400  BP: 135/73  Pulse: 70  Temp:   Resp: 21     Intake/Output Summary (Last 24 hours) at 02/09/15 0837 Last data filed at 02/09/15 0401  Gross per 24 hour  Intake    820 ml  Output    400 ml  Net    420 ml    GENERAL: Well-nourished well-developed; high flow nasal cannula oxygen. He is accompanied by his wife. He is sitting in the bed with high flow nasal cannula. EYES: Positive for pallor no icterus. NECK: supple, no masses felt LYMPH: no palpable lymphadenopathy in the cervical, axillary or inguinal regions LUNGS: Decreased air entry bilaterally. Bilateral coarse breath sounds. HEART/CVS: regular rate & rhythm and no murmurs; bilateral 1+ edema. ABDOMEN: abdomen soft, non-tender and normal bowel sounds Musculoskeletal:no cyanosis of digits and no clubbing  NEURO: no focal motor/sensory deficits SKIN: no rashes or significant lesions  Labs:  Lab Results  Component Value Date   WBC 133.5* 02/08/2015   HGB 9.6* 02/08/2015   HCT 32.2* 02/08/2015   MCV 96.9 02/08/2015   PLT 136* 02/08/2015   NEUTROABS 20.0* 02/08/2015    Lab Results  Component Value Date   NA 135 02/09/2015   K 5.4* 02/09/2015   CL 99* 02/09/2015   CO2 30 02/09/2015    Studies:  No results found.  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  # Respiratory failure/status-unclear etiology; question related to atypical infection-PCP versus others. On streoids; Bactrim; Pen/Clindamycin per ID.   #CLL- hyperleukocytosis/predominant lymphocytosis-  Yesterday- white count is 133-improved since admission/hemoglobin stable at 9.6 and platelets stable at 136.  # A. Fib- currently in sinus rhythm/rate controlled.  Recommendations:  # Appreciate ID recommendations. Given- hypogammoglobinemia, Okay with IVIG from Oncology standpoint.    # Again with the patient's wife Dean Collins- that bronchoscopy/CT scan might be useful diagnostic tools if patients respiratory status does not improve in the next few days.   # Continue to hold ibrutinib.   # Discussed at length with patients wife.   Cammie Sickle, MD 02/09/2015  8:37 AM

## 2015-02-09 NOTE — Progress Notes (Signed)
Noted that lab glucose this am was 306 mg/dl.  Recommend checking CBGs AC & HS and starting Novolog SENSITIVE correction scale AC & HS while in the hospital and on steroids. Will monitor blood sugars while in the hospital. Harvel Ricks RN BSN CDE

## 2015-02-09 NOTE — Care Management (Signed)
Patient has not been able  to participate with physical therapy due to low activity tolerance.  He remains on high flow nasal cannula.

## 2015-02-10 ENCOUNTER — Inpatient Hospital Stay: Payer: Medicare Other

## 2015-02-10 DIAGNOSIS — J80 Acute respiratory distress syndrome: Secondary | ICD-10-CM

## 2015-02-10 LAB — CBC WITH DIFFERENTIAL/PLATELET
BAND NEUTROPHILS: 0 %
BASOS ABS: 0 10*3/uL (ref 0–0.1)
BLASTS: 0 %
Basophils Relative: 0 %
EOS ABS: 1.6 10*3/uL — AB (ref 0–0.7)
Eosinophils Relative: 1 %
HEMATOCRIT: 33 % — AB (ref 40.0–52.0)
Hemoglobin: 9.7 g/dL — ABNORMAL LOW (ref 13.0–18.0)
LYMPHS PCT: 83 %
Lymphs Abs: 128.7 10*3/uL — ABNORMAL HIGH (ref 1.0–3.6)
MCH: 28.5 pg (ref 26.0–34.0)
MCHC: 29.4 g/dL — ABNORMAL LOW (ref 32.0–36.0)
MCV: 96.8 fL (ref 80.0–100.0)
METAMYELOCYTES PCT: 0 %
MONOS PCT: 4 %
Monocytes Absolute: 6.2 10*3/uL — ABNORMAL HIGH (ref 0.2–1.0)
Myelocytes: 1 %
NEUTROS ABS: 15.5 10*3/uL — AB (ref 1.4–6.5)
Neutrophils Relative %: 9 %
Other: 2 %
PROMYELOCYTES ABS: 0 %
Platelets: 185 10*3/uL (ref 150–440)
RBC: 3.41 MIL/uL — AB (ref 4.40–5.90)
RDW: 15.8 % — ABNORMAL HIGH (ref 11.5–14.5)
WBC: 155.1 10*3/uL — AB (ref 3.8–10.6)
nRBC: 0 /100 WBC

## 2015-02-10 LAB — BLOOD GAS, ARTERIAL
ACID-BASE EXCESS: 1.2 mmol/L (ref 0.0–3.0)
ALLENS TEST (PASS/FAIL): POSITIVE — AB
Bicarbonate: 30.3 mEq/L — ABNORMAL HIGH (ref 21.0–28.0)
FIO2: 1
O2 Saturation: 95 %
PEEP: 10 cmH2O
PH ART: 7.25 — AB (ref 7.350–7.450)
Patient temperature: 37
RATE: 16 resp/min
VT: 450 mL
pCO2 arterial: 69 mmHg — ABNORMAL HIGH (ref 32.0–48.0)
pO2, Arterial: 87 mmHg (ref 83.0–108.0)

## 2015-02-10 LAB — GLUCOSE, CAPILLARY
GLUCOSE-CAPILLARY: 119 mg/dL — AB (ref 65–99)
GLUCOSE-CAPILLARY: 198 mg/dL — AB (ref 65–99)
GLUCOSE-CAPILLARY: 228 mg/dL — AB (ref 65–99)
Glucose-Capillary: 144 mg/dL — ABNORMAL HIGH (ref 65–99)
Glucose-Capillary: 158 mg/dL — ABNORMAL HIGH (ref 65–99)
Glucose-Capillary: 188 mg/dL — ABNORMAL HIGH (ref 65–99)

## 2015-02-10 LAB — BASIC METABOLIC PANEL
ANION GAP: 6 (ref 5–15)
BUN: 33 mg/dL — ABNORMAL HIGH (ref 6–20)
CHLORIDE: 99 mmol/L — AB (ref 101–111)
CO2: 27 mmol/L (ref 22–32)
Calcium: 8.4 mg/dL — ABNORMAL LOW (ref 8.9–10.3)
Creatinine, Ser: 1.3 mg/dL — ABNORMAL HIGH (ref 0.61–1.24)
GFR, EST NON AFRICAN AMERICAN: 52 mL/min — AB (ref >60)
Glucose, Bld: 250 mg/dL — ABNORMAL HIGH (ref 65–99)
POTASSIUM: 4.4 mmol/L (ref 3.5–5.1)
SODIUM: 132 mmol/L — AB (ref 135–145)

## 2015-02-10 LAB — TRIGLYCERIDES: Triglycerides: 156 mg/dL — ABNORMAL HIGH (ref <150)

## 2015-02-10 MED ORDER — VITAL HIGH PROTEIN PO LIQD
1000.0000 mL | ORAL | Status: DC
  Administered 2015-02-10: 1000 mL

## 2015-02-10 MED ORDER — BENZONATATE 100 MG PO CAPS
100.0000 mg | ORAL_CAPSULE | Freq: Three times a day (TID) | ORAL | Status: DC
  Administered 2015-02-10: 100 mg via ORAL
  Filled 2015-02-10: qty 1

## 2015-02-10 MED ORDER — FENTANYL CITRATE (PF) 100 MCG/2ML IJ SOLN
50.0000 ug | Freq: Once | INTRAMUSCULAR | Status: AC
  Administered 2015-02-10: 50 ug via INTRAVENOUS

## 2015-02-10 MED ORDER — FENTANYL 2500MCG IN NS 250ML (10MCG/ML) PREMIX INFUSION
25.0000 ug/h | INTRAVENOUS | Status: DC
  Administered 2015-02-10: 250 ug/h via INTRAVENOUS
  Administered 2015-02-10 – 2015-02-11 (×2): 300 ug/h via INTRAVENOUS
  Filled 2015-02-10 (×2): qty 250

## 2015-02-10 MED ORDER — INSULIN ASPART 100 UNIT/ML ~~LOC~~ SOLN
2.0000 [IU] | SUBCUTANEOUS | Status: DC
Start: 2015-02-10 — End: 2015-02-11
  Administered 2015-02-10: 4 [IU] via SUBCUTANEOUS
  Administered 2015-02-10: 6 [IU] via SUBCUTANEOUS
  Administered 2015-02-11 (×2): 4 [IU] via SUBCUTANEOUS
  Administered 2015-02-11: 2 [IU] via SUBCUTANEOUS
  Filled 2015-02-10: qty 2
  Filled 2015-02-10: qty 4
  Filled 2015-02-10: qty 6
  Filled 2015-02-10 (×2): qty 4

## 2015-02-10 MED ORDER — SODIUM CHLORIDE 0.45 % IV SOLN
INTRAVENOUS | Status: DC
  Administered 2015-02-10: 18:00:00 via INTRAVENOUS

## 2015-02-10 MED ORDER — MIDAZOLAM HCL 2 MG/2ML IJ SOLN
INTRAMUSCULAR | Status: AC
  Filled 2015-02-10: qty 4

## 2015-02-10 MED ORDER — FENTANYL 2500MCG IN NS 250ML (10MCG/ML) PREMIX INFUSION
INTRAVENOUS | Status: AC
  Filled 2015-02-10: qty 250

## 2015-02-10 MED ORDER — PROPOFOL 1000 MG/100ML IV EMUL
0.0000 ug/kg/min | INTRAVENOUS | Status: DC
  Administered 2015-02-10 (×2): 50 ug/kg/min via INTRAVENOUS
  Administered 2015-02-10: 15 mg/h via INTRAVENOUS
  Administered 2015-02-10: 40 ug/kg/min via INTRAVENOUS
  Administered 2015-02-10: 50 ug/kg/min via INTRAVENOUS
  Administered 2015-02-10: 1000 mg via INTRAVENOUS
  Administered 2015-02-11 (×3): 50 ug/kg/min via INTRAVENOUS
  Filled 2015-02-10 (×7): qty 100

## 2015-02-10 MED ORDER — IOHEXOL 350 MG/ML SOLN
100.0000 mL | Freq: Once | INTRAVENOUS | Status: AC | PRN
  Administered 2015-02-10: 100 mL via INTRAVENOUS

## 2015-02-10 MED ORDER — FENTANYL BOLUS VIA INFUSION
25.0000 ug | INTRAVENOUS | Status: DC | PRN
Start: 2015-02-10 — End: 2015-02-11
  Filled 2015-02-10: qty 25

## 2015-02-10 MED ORDER — PROPOFOL 1000 MG/100ML IV EMUL
INTRAVENOUS | Status: AC
  Administered 2015-02-10: 1000 mg via INTRAVENOUS
  Filled 2015-02-10: qty 100

## 2015-02-10 MED ORDER — SUCCINYLCHOLINE CHLORIDE 20 MG/ML IJ SOLN
30.0000 mg | Freq: Once | INTRAMUSCULAR | Status: AC
  Administered 2015-02-10: 30 mg via INTRAVENOUS
  Filled 2015-02-10: qty 1.5

## 2015-02-10 MED ORDER — ETOMIDATE 2 MG/ML IV SOLN
20.0000 mg | Freq: Once | INTRAVENOUS | Status: AC
  Administered 2015-02-10: 20 mg via INTRAVENOUS
  Filled 2015-02-10: qty 10

## 2015-02-10 MED ORDER — MIDAZOLAM HCL 2 MG/2ML IJ SOLN
4.0000 mg | Freq: Once | INTRAMUSCULAR | Status: AC
  Administered 2015-02-10: 4 mg via INTRAVENOUS

## 2015-02-10 MED ORDER — FAMOTIDINE IN NACL 20-0.9 MG/50ML-% IV SOLN
20.0000 mg | Freq: Two times a day (BID) | INTRAVENOUS | Status: DC
Start: 2015-02-10 — End: 2015-02-11
  Administered 2015-02-10 – 2015-02-11 (×2): 20 mg via INTRAVENOUS
  Filled 2015-02-10 (×3): qty 50

## 2015-02-10 MED ORDER — ANTISEPTIC ORAL RINSE SOLUTION (CORINZ)
7.0000 mL | Freq: Four times a day (QID) | OROMUCOSAL | Status: DC
  Administered 2015-02-10 – 2015-02-11 (×3): 7 mL via OROMUCOSAL
  Filled 2015-02-10 (×7): qty 7

## 2015-02-10 MED ORDER — CHLORHEXIDINE GLUCONATE 0.12% ORAL RINSE (MEDLINE KIT)
15.0000 mL | Freq: Two times a day (BID) | OROMUCOSAL | Status: DC
  Administered 2015-02-10 – 2015-02-11 (×2): 15 mL via OROMUCOSAL
  Filled 2015-02-10 (×4): qty 15

## 2015-02-10 MED ORDER — GUAIFENESIN-DM 100-10 MG/5ML PO SYRP
5.0000 mL | ORAL_SOLUTION | Freq: Four times a day (QID) | ORAL | Status: DC
  Administered 2015-02-10 – 2015-02-11 (×5): 5 mL via ORAL
  Filled 2015-02-10 (×5): qty 5

## 2015-02-10 MED ORDER — SENNOSIDES 8.8 MG/5ML PO SYRP
5.0000 mL | ORAL_SOLUTION | Freq: Two times a day (BID) | ORAL | Status: DC | PRN
  Filled 2015-02-10: qty 5

## 2015-02-10 NOTE — Progress Notes (Signed)
CC: follow up SOB, hypoxia  HPI: Still very SOB,  Rattling cough Still requiring high flow [place] O2 fio2 72% Patient unable to tolerate laying down Appreciate ID input     Filed Vitals:   02/10/15 0400 02/10/15 0700 02/10/15 0729 02/10/15 0800  BP: 127/69   116/66  Pulse: 64 65  67  Temp: 97.5 F (36.4 C)   98.3 F (36.8 C)  TempSrc: Oral   Oral  Resp: 26 21  16   Height:      Weight:      SpO2: 91% 89% 91% 89%   Frail,  HEENT WNL No JVD noted No wheezes, diffuse coarse rhonchi s1s2 noted no M noted NABS, soft Ext warm, no edema    EXCELLENT SPECIMEN - 90-100% WBCS  MANY WBC SEEN  MANY GRAM POSITIVE COCCI IN PAIRS IN CHAINS  FEW GRAM NEGATIVE COCCOBACILLI       Culture RARE GROWTH GROUP A STREP (S.PYOGENES) ISOLATED              BMP Latest Ref Rng 02/09/2015 02/09/2015 02/08/2015  Glucose 65 - 99 mg/dL - 306(H) 205(H)  BUN 6 - 20 mg/dL - 35(H) 29(H)  Creatinine 0.61 - 1.24 mg/dL - 1.14 1.01  Sodium 135 - 145 mmol/L - 135 139  Potassium 3.5 - 5.1 mmol/L 4.7 5.4(H) 4.8  Chloride 101 - 111 mmol/L - 99(L) 100(L)  CO2 22 - 32 mmol/L - 30 32  Calcium 8.9 - 10.3 mg/dL - 8.6(L) 9.0    CBC Latest Ref Rng 02/08/2015 02/07/2015 02/06/2015  WBC 3.8 - 10.6 K/uL 133.5(HH) 112.0(HH) 118.8(HH)  Hemoglobin 13.0 - 18.0 g/dL 9.6(L) 9.0(L) 9.8(L)  Hematocrit 40.0 - 52.0 % 32.2(L) 29.9(L) 31.9(L)  Platelets 150 - 440 K/uL 136(L) 126(L) 115(L)   Results for orders placed or performed during the hospital encounter of 02/09/2015  Blood Culture (routine x 2)     Status: None (Preliminary result)   Collection Time: 02/09/2015  5:12 PM  Result Value Ref Range Status   Specimen Description BLOOD RIGHT HAND  Final   Special Requests BOTTLES DRAWN AEROBIC AND ANAEROBIC [place]  Final   Culture NO GROWTH 5 DAYS  Final   Report Status PENDING  Incomplete  Blood Culture (routine x 2)     Status: None   Collection Time: 01/23/2015  5:13 PM  Result Value Ref Range Status   Specimen  Description BLOOD RIGHT ARM  Final   Special Requests BOTTLES DRAWN AEROBIC AND ANAEROBIC [place]  Final   Culture NO GROWTH 5 DAYS  Final   Report Status 02/05/2015 FINAL  Final  Culture, sputum-assessment     Status: None   Collection Time: 01/26/2015  5:20 PM  Result Value Ref Range Status   Specimen Description SPUTUM  Final   Special Requests Immunocompromised  Final   Sputum evaluation THIS SPECIMEN IS ACCEPTABLE FOR SPUTUM CULTURE  Final   Report Status 02/14/2015 FINAL  Final  Urine culture     Status: None   Collection Time: 02/10/2015  5:20 PM  Result Value Ref Range Status   Specimen Description URINE, RANDOM  Final   Special Requests Immunocompromised  Final   Culture INSIGNIFICANT GROWTH  Final   Report Status 02/04/2015 FINAL  Final  Culture, respiratory (NON-Expectorated)     Status: None   Collection Time: 02/06/2015  5:20 PM  Result Value Ref Range Status   Specimen Description SPUTUM  Final   Special Requests Immunocompromised Reflexed from [id number]  Final  Gram Stain   Final    GOOD SPECIMEN - 80-90% WBCS MODERATE WBC SEEN MANY GRAM POSITIVE COCCI IN CHAINS IN CLUSTERS MANY GRAM NEGATIVE COCCOBACILLI    Culture   Final    HEAVY GROWTH GROUP A STREP (S.PYOGENES) ISOLATED There is no known Penicillin Resistant Beta Streptococcus in the [place] For patients that are Penicillin-allergic, Erythromycin is 85-94% susceptible, and Clindamycin is 80% susceptible.  Contact Microbiology within 7 days if sensitivity testing is  required.      Report Status 02/02/2015 FINAL  Final  MRSA PCR Screening     Status: None   Collection Time: 01/24/2015 10:47 PM  Result Value Ref Range Status   MRSA by PCR NEGATIVE NEGATIVE Final    Comment:        The GeneXpert MRSA Assay (FDA approved for NASAL specimens only), is one component of a comprehensive MRSA colonization surveillance program. It is not intended to diagnose MRSA infection nor to guide or monitor treatment for MRSA  infections.   Culture, respiratory (NON-Expectorated)     Status: None   Collection Time: 02/01/15  1:40 AM  Result Value Ref Range Status   Specimen Description TRACHEAL ASPIRATE  Final   Special Requests NONE  Final   Gram Stain   Final    EXCELLENT SPECIMEN - 90-100% WBCS MANY WBC SEEN MANY GRAM POSITIVE COCCI IN PAIRS IN CHAINS FEW GRAM NEGATIVE COCCOBACILLI    Culture   Final    RARE GROWTH GROUP A STREP (S.PYOGENES) ISOLATED There is no known Penicillin Resistant Beta Streptococcus in the [place] For patients that are Penicillin-allergic, Erythromycin is 85-94% susceptible, and Clindamycin is 80% susceptible.  Contact Microbiology within 7 days if sensitivity testing is  required.      Report Status 02/06/2015 FINAL  Final  Urine culture     Status: None   Collection Time: 02/02/15  2:35 PM  Result Value Ref Range Status   Specimen Description URINE, RANDOM  Final   Special Requests Immunocompromised  Final   Culture NO GROWTH 2 DAYS  Final   Report Status 02/04/2015 FINAL  Final  Culture, expectorated sputum-assessment     Status: None   Collection Time: 02/09/15 11:24 AM  Result Value Ref Range Status   Specimen Description EXPECTORATED SPUTUM  Final   Special Requests Immunocompromised  Final   Sputum evaluation THIS SPECIMEN IS ACCEPTABLE FOR SPUTUM CULTURE  Final   Report Status 02/09/2015 FINAL  Final   Anti-infectives    Start     Dose/Rate Route Frequency Ordered Stop   02/08/15 2200  clindamycin (CLEOCIN) IVPB 900 mg     900 mg 100 mL/hr over 30 Minutes Intravenous 3 times per day 02/08/15 1803     02/08/15 2000  penicillin G potassium 4 Million Units in dextrose 5 % 250 mL IVPB     4 Million Units 250 mL/hr over 60 Minutes Intravenous 6 times per day 02/08/15 1803     02/08/15 1200  sulfamethoxazole-trimethoprim (BACTRIM DS,SEPTRA DS) 800-160 MG per tablet 2 tablet     2 tablet Oral 3 times daily 02/08/15 1121     02/02/15 1500  cefTRIAXone (ROCEPHIN) 2 g in  dextrose 5 % 50 mL IVPB  Status:  Discontinued     2 g 100 mL/hr over 30 Minutes Intravenous Every 24 hours 02/02/15 1424 02/08/15 1803   02/01/15 0700  vancomycin (VANCOCIN) IVPB 1000 mg/200 mL premix  Status:  Discontinued     1,000 mg 200 mL/hr  over 60 Minutes Intravenous Every 12 hours 02/07/2015 2229 02/02/15 1042   01/30/2015 2245  piperacillin-tazobactam (ZOSYN) IVPB 4.5 g  Status:  Discontinued     4.5 g 25 mL/hr over 240 Minutes Intravenous 3 times per day 01/28/2015 2235 02/02/15 1424   02/16/2015 1800  vancomycin (VANCOCIN) IVPB 1000 mg/200 mL premix     1,000 mg 200 mL/hr over 60 Minutes Intravenous  Once 01/25/2015 1759 01/26/2015 2006   01/24/2015 1800  cefTRIAXone (ROCEPHIN) 1 g in dextrose 5 % 50 mL IVPB     1 g 100 mL/hr over 30 Minutes Intravenous  Once 02/13/2015 1759 01/26/2015 1952    TTE 11/11: LVEF 55 - 60%  CXR: RLL atelectasis/collapse   IMPRESSION: Acute respiratory failure-findings c/w ALI/ARDS with Strep Pyogenes pneumonia ARDS due to above. RLL PNA RLL atelectasis New onset AF > NSR on amiodarone Hypokalemia, resolved Thrombocytopenia, improving CLL with  leukocytosis   PLAN/REC: Cont SDU level of care Cont high flow O2 by [place]-wean fio2 as tolerated Continue abx as per ID for strep pyogens pneumonia Chest percussion as tolerated Albuterol nebs 11/13 to facilitate mucociliary function Flutter valve 11/13 Will stop amiodarone in setting of ALI Monitor BMET intermittently Monitor I/Os Correct electrolytes as indicated DVT px: enoxaparin Monitor CBC intermittently Transfuse per usual guidelines Xanax scheduled 1 mg TID for extreme anxiety-seems to be working well  Marda Stalker M.D.   Critical Care Attestation.  I have personally obtained a history, examined the patient, evaluated laboratory and imaging results, formulated the assessment and plan and placed orders. The Patient requires high complexity decision making for assessment and support, frequent  evaluation and titration of therapies, application of advanced monitoring technologies and extensive interpretation of multiple databases. The patient has critical illness that could lead imminently to failure of 1 or more organ systems and requires the highest level of physician preparedness to intervene.  Critical Care Time devoted to patient care services described in this note is 35 minutes and is exclusive of time spent in procedures.

## 2015-02-10 NOTE — Progress Notes (Signed)
Dean Collins   DOB:1940-04-12   [id number]    CC: CLL hyperleukocytosis/respiratory failure  Subjective: Patient continues to be on high flow nasal oxygen-however 65-75%; stable from yesterday-no improvement or worsening. Patient states that his breathing improves after getting a breathing treatment.  Patient still complained of coughing. No fever chills. As per the wife, patient seems to do a little better. Appetite is improving. He is eating better.   Review of systems: Is limited because of patient's respiratory distress.   Objective:  Filed Vitals:   02/10/15 0800  BP: 116/66  Pulse: 67  Temp: 98.3 F (36.8 C)  Resp: 16     Intake/Output Summary (Last 24 hours) at 02/10/15 1224 Last data filed at 02/10/15 1106  Gross per 24 hour  Intake   3210 ml  Output   2940 ml  Net    270 ml    GENERAL: Well-nourished well-developed; high flow nasal cannula oxygen. He is accompanied by his wife. He is sitting in the bed with high flow nasal cannula. EYES: Positive for pallor no icterus. NECK: supple, no masses felt LYMPH: no palpable lymphadenopathy in the cervical, axillary or inguinal regions LUNGS: Decreased air entry bilaterally. Bilateral coarse breath sounds. HEART/CVS: regular rate & rhythm and no murmurs; bilateral 1+ edema. ABDOMEN: abdomen soft, non-tender and normal bowel sounds Musculoskeletal:no cyanosis of digits and no clubbing  NEURO: no focal motor/sensory deficits SKIN: no rashes or significant lesions  Labs:  Lab Results  Component Value Date   WBC 133.5* 02/08/2015   HGB 9.6* 02/08/2015   HCT 32.2* 02/08/2015   MCV 96.9 02/08/2015   PLT 136* 02/08/2015   NEUTROABS 20.0* 02/08/2015    Lab Results  Component Value Date   NA 132* 02/10/2015   K 4.4 02/10/2015   CL 99* 02/10/2015   CO2 27 02/10/2015    Studies:  Dg Chest 1 View  02/10/2015  CLINICAL DATA:  Patient admitted on 02/06/2015 for respiratory failure. Subsequent encounter. EXAM:  CHEST 1 VIEW COMPARISON:  02/06/2015 and older exams. FINDINGS: Lung volumes remain relatively low. There is persistent opacity at the lung bases most likely combination of atelectasis and small effusions. Mild hazy airspace opacity at the lung bases is also noted which could be due to atelectasis or mild basilar edema. Cardiac silhouette is partly obscured. Is borderline to mildly enlarged. No mediastinal or hilar masses. Anterior chest wall Port-A-Cath is stable. IMPRESSION: 1. No significant change from the most recent prior study. 2. Small pleural effusions with lung base atelectasis. There may be a component of mild pulmonary edema in the lower lungs bilaterally. No convincing pneumonia. Electronically Signed   By: Lajean Manes M.D.   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  # Respiratory failure/status- question related to atypical infection-PCP versus others. On streoids; Bactrim; Rocephin/Clindamycin per ID. no significant improvement noted from yesterday. Needing high flow nasal oxygen ~70-75%  #CLL- hyperleukocytosis/predominant lymphocytosis- labs pending today.  # A. Fib- currently in sinus rhythm/rate controlled.  Recommendations:  # Appreciate ID recommendations. 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    # Again with the patient's wife Dean Collins- that bronchoscopy/CT scan might be useful diagnostic tools if patients respiratory status does not improve in the next few days.   # Continue to hold ibrutinib.   # Discussed  at length with patients wife. Also discussed with Dr.Ramachandran/critical care specialist.  Cammie Sickle, MD 02/10/2015  12:24 PM

## 2015-02-10 NOTE — Progress Notes (Signed)
eLink Physician-Brief Progress Note Patient Name: Dean Collins DOB: 06/07/1940 MRN: [id number]   Date of Service  02/10/2015  HPI/Events of Note  ABG on PRVC 16/450/100% PEEP 10 -> 7.25/69/89  eICU Interventions  Patient sedated and not breathing over the ventilator set rate. Will increase RR to 20. Repeat ABG in am.     Intervention Category Major Interventions: Acid-Base disturbance - evaluation and management  Dimas Chyle 02/10/2015, 6:43 PM

## 2015-02-10 NOTE — Progress Notes (Signed)
Patient noted to be acutely hypoxic with an O2 sat of 88%, the patient had been on high flow of 60%. However, he developed persistent hypoxemia on this level. Therefore, it was increased to 100%. In addition, he was started on a nonrebreather mask and greater than 15 L. Despite these changes, the O2 sat remained relatively low. I discussed the situation with the family at bedside and discussed the options including intubation versus continued monitoring and treatment. Given his very low O2 sats. I recommended that he be intubated should the family wish to pursue all options at this time. He appeared to be in agreement with this plan.  -Endotracheal Intubation: Patient required placement of an artificial airway secondary to resp failure.   Consent: Emergent.   Hand washing performed prior to starting the procedure.   Medications administered for sedation prior to procedure: Midazolam 4 mg IV, propofol 100 mg, etomidate 30 mg. A postop dose of succinylcholine 30 mg was also given, to reduce biting of the tube. Procedure: A time out procedure was called and correct patient, name, & ID confirmed. Needed supplies and equipment were assembled and checked to include ETT, 10 ml syringe, Glidescope, Mac and Miller blades, suction, oxygen and bag mask valve, end tidal CO2 monitor. Patient was positioned to align the mouth and pharynx to facilitate visualization of the glottis.  Heart rate, SpO2 and blood pressure was continuously monitored during the procedure. Pre-oxygenation was conducted prior to intubation and endotracheal tube was placed through the vocal cords into the trachea.  During intubation an assistant applied gentle pressure to the cricoid cartilage.   The artificial airway was placed under direct visualization via glidescope route using a 8 ETT on the first attempt, after initially using suction to clear up the airways.    ETT was secured at 24 cm mark.    Placement was confirmed by  auscuitation of lungs with good breath sounds bilaterally and no stomach sounds.  Condensation was noted on endotracheal tube.  Pulse ox 99 %.  CO2 detector in place with appropriate color change.   Complications: None .   Operator: Ashby Dawes, M.D.   Chest radiograph ordered and pending.     Marda Stalker, M.D.

## 2015-02-10 NOTE — Progress Notes (Signed)
eLink Physician-Brief Progress Note Patient Name: NEHAN BROCKS DOB: Dec 18, 1940 MRN: [id number]   Date of Service  02/10/2015  HPI/Events of Note  Intubated 11/19  eICU Interventions  Added stress ulcer prophylaxis     Intervention Category Intermediate Interventions: Best-practice therapies (e.g. DVT, beta blocker, etc.)  Dimas Chyle 02/10/2015, 4:59 PM

## 2015-02-10 NOTE — Progress Notes (Signed)
[facility] NAME: Dean Collins    MR#:  [id number]  DATE OF BIRTH:  04-21-1940  SUBJECTIVE:  Admitted 11/9 for PNA on Bipap and later Intubated. Extubated 11/11 and Still on high flow oxygen. O2 75%. Dry cough. Feels a little better.  REVIEW OF SYSTEMS:   CONSTITUTIONAL: No fever, has weakness.  EYES: No blurred or double vision.  EARS, NOSE, AND THROAT: No tinnitus or ear pain. Stuffy nose. RESPIRATORY: has cough and shortness of breath, wheezing, no hemoptysis.  CARDIOVASCULAR: No chest pain, orthopnea, edema.  GASTROINTESTINAL: No nausea, vomiting, diarrhea or abdominal pain.  GENITOURINARY: No dysuria, hematuria.  ENDOCRINE: No polyuria, nocturia. HEMATOLOGY: No anemia, easy bruising or bleeding SKIN: No rash or lesion. MUSCULOSKELETAL: No joint pain or arthritis.  NEUROLOGIC: No tingling, numbness, weakness.  PSYCHIATRY: No anxiety or depression.    DRUG ALLERGIES:  No Known Allergies  VITALS:  Blood pressure 116/66, pulse 67, temperature 98.3 F (36.8 C), temperature source Oral, resp. rate 16, height 5\' 10"  (1.778 m), weight 108.9 kg (240 lb 1.3 oz), SpO2 89 %.  PHYSICAL EXAMINATION:  GENERAL:  74 y.o.-year-old patient lying in the bed with resp distress. Looks critically ill EYES: Pupils equal, round, reactive to light and accommodation. No scleral icterus. HEENT: Head atraumatic, normocephalic.  Wheezing sound present through his nose. NECK:  Supple, no jugular venous distention. No thyroid enlargement, no tenderness.  LUNGS: On high flow oxygen. Moderate air entry with coarse breath sounds. No wheezing or crackles. CARDIOVASCULAR: Irregularly irregular. No murmurs, rubs, or gallops.  ABDOMEN: Soft, nontender, nondistended. Bowel sounds present. No organomegaly or mass.  EXTREMITIES: No pedal edema, cyanosis, or clubbing.  NEUROLOGIC: Patient is awake, alert and oriented, follows commands.  Power 4/5 in all extremities. PSYCHIATRIC: alert and awake, quiet. SKIN: No obvious rash, lesion, or ulcer.   Decreased hearing   LABORATORY PANEL:   CBC  Recent Labs Lab 02/08/15 0458  WBC 133.5*  HGB 9.6*  HCT 32.2*  PLT 136*   ------------------------------------------------------------------------------------------------------------------  Chemistries   Recent Labs Lab 02/04/15 0457  02/09/15 0531 02/09/15 1124  NA 141  < > 135  --   K 3.8  < > 5.4* 4.7  CL 106  < > 99*  --   CO2 26  < > 30  --   GLUCOSE 136*  < > 306*  --   BUN 22*  < > 35*  --   CREATININE 1.10  < > 1.14  --   CALCIUM 8.7*  < > 8.6*  --   MG  --   --  2.2  --   AST 16  --   --   --   ALT 17  --   --   --   ALKPHOS 81  --   --   --   BILITOT 0.9  --   --   --   < > = values in this interval not displayed. ------------------------------------------------------------------------------------------------------------------  Cardiac Enzymes No results for input(s): TROPONINI in the last 168 hours. ------------------------------------------------------------------------------------------------------------------  RADIOLOGY:  No results found.    ASSESSMENT AND PLAN:   74 year old male with past medical history of CLL, hypothyroidism, depression, hyperlipidemia, history of prostate cancer, who presented to the hospital due to shortness of breath, weakness, sputum production and noted to be in acute respiratory failure with hypoxia.  # Acute respiratory failure with hypoxia- Streptococcus grp A pneumonia. -Extubated 11/11 and still on HFNC 75%.  Critically ill. -Since he is immunosuppressed given his underlying malignancy treated with broad-spectrum IV antibiotics with vancomycin, Zosyn. - blood CX NTD On ON PCN, Clinda and Bactrim per ID Nebs. Solumedrol. Try to wean off HFNC O2. IVIG for 5 days CT chest ordered but patient unable to lay flat for the test  # A. fib with RVR due to  infection Improved. Amiodarone stopped by pulmonary due to ALI  # CLL-patient's white cell count is significantly elevated. -Appreciate oncology recommendations -continue to  hold his chemotherapy med given his acute infectious process.  # Hypothyroidism-continue Synthroid at 125 g per day.   # hyperlipidemia-continue simvastatin.  # depression, continue Zoloft.   Discussed with Dr. Juanell Fairly  Discussed in detail with Patient and wife at bedside. He does not want Intubation. He wants to think further regarding CPR.  CODE STATUS: full code.  TOTAL CRITICAL TIME TAKING CARE OF THIS PATIENT: 40 minutes.    Hillary Bow R M.D on 02/10/2015 at 10:44 AM  Between 7am to 6pm - Pager - [phone] After 6pm go to [email or web address] - password [email or web address] [facility]  Office  [phone]  CC: Primary care physician; Idelle Crouch, MD

## 2015-02-10 NOTE — Progress Notes (Signed)
Notified Dr. Juanell Fairly of pt increase in oxygen requirements. Pt now on Non-rebreather at 100 percent and Hi flow cannula at 100 percent. Pt now has expiratory wheezes and inspiratory wheezes that were not present this morning. Dr. Juanell Fairly currently at bedside talking with family.

## 2015-02-10 NOTE — Progress Notes (Signed)
Called Dr.Ram to inform him of new lab results. Sodium 133 and WBC of 155 No new orders given. Will continue to assess.

## 2015-02-11 ENCOUNTER — Inpatient Hospital Stay: Payer: Medicare Other

## 2015-02-11 DIAGNOSIS — R6521 Severe sepsis with septic shock: Secondary | ICD-10-CM

## 2015-02-11 DIAGNOSIS — A419 Sepsis, unspecified organism: Principal | ICD-10-CM

## 2015-02-11 DIAGNOSIS — I959 Hypotension, unspecified: Secondary | ICD-10-CM

## 2015-02-11 LAB — CBC WITH DIFFERENTIAL/PLATELET
BASOS ABS: 0 10*3/uL (ref 0–0.1)
BLASTS: 0 %
Band Neutrophils: 0 %
Basophils Relative: 0 %
EOS ABS: 0 10*3/uL (ref 0–0.7)
Eosinophils Relative: 0 %
HEMATOCRIT: 26.4 % — AB (ref 40.0–52.0)
HEMOGLOBIN: 8.4 g/dL — AB (ref 13.0–18.0)
LYMPHS PCT: 87 %
Lymphs Abs: 157.2 10*3/uL — ABNORMAL HIGH (ref 1.0–3.6)
MCH: 29.8 pg (ref 26.0–34.0)
MCHC: 31.7 g/dL — AB (ref 32.0–36.0)
MCV: 94.1 fL (ref 80.0–100.0)
METAMYELOCYTES PCT: 1 %
MONOS PCT: 5 %
Monocytes Absolute: 9 10*3/uL — ABNORMAL HIGH (ref 0.2–1.0)
Myelocytes: 0 %
NEUTROS ABS: 14.5 10*3/uL — AB (ref 1.4–6.5)
Neutrophils Relative %: 7 %
OTHER: 0 %
PROMYELOCYTES ABS: 0 %
Platelets: 180 10*3/uL (ref 150–440)
RBC: 2.8 MIL/uL — AB (ref 4.40–5.90)
RDW: 15.7 % — ABNORMAL HIGH (ref 11.5–14.5)
WBC: 180.7 10*3/uL — AB (ref 3.8–10.6)
nRBC: 0 /100 WBC

## 2015-02-11 LAB — BLOOD GAS, ARTERIAL
ACID-BASE DEFICIT: 2.4 mmol/L — AB (ref 0.0–2.0)
ACID-BASE DEFICIT: 5.1 mmol/L — AB (ref 0.0–2.0)
Allens test (pass/fail): POSITIVE — AB
Allens test (pass/fail): POSITIVE — AB
BICARBONATE: 26.4 meq/L (ref 21.0–28.0)
BICARBONATE: 24 meq/L (ref 21.0–28.0)
FIO2: 1
FIO2: 0.9
LHR: 20 {breaths}/min
Mechanical Rate: 20
Mechanical Rate: 24
O2 SAT: 97.6 %
O2 Saturation: 93.9 %
PCO2 ART: 60 mmHg — AB (ref 32.0–48.0)
PEEP/CPAP: 10 cmH2O
PH ART: 7.21 — AB (ref 7.350–7.450)
PO2 ART: 85 mmHg (ref 83.0–108.0)
Patient temperature: 37
Patient temperature: 37
VT: 450 mL
pCO2 arterial: 63 mmHg — ABNORMAL HIGH (ref 32.0–48.0)
pH, Arterial: 7.23 — ABNORMAL LOW (ref 7.350–7.450)
pO2, Arterial: 114 mmHg — ABNORMAL HIGH (ref 83.0–108.0)

## 2015-02-11 LAB — BASIC METABOLIC PANEL
Anion gap: 5 (ref 5–15)
BUN: 36 mg/dL — ABNORMAL HIGH (ref 6–20)
CHLORIDE: 97 mmol/L — AB (ref 101–111)
CO2: 26 mmol/L (ref 22–32)
CREATININE: 1.72 mg/dL — AB (ref 0.61–1.24)
Calcium: 7.7 mg/dL — ABNORMAL LOW (ref 8.9–10.3)
GFR calc non Af Amer: 37 mL/min — ABNORMAL LOW (ref >60)
GFR, EST AFRICAN AMERICAN: 43 mL/min — AB (ref >60)
Glucose, Bld: 182 mg/dL — ABNORMAL HIGH (ref 65–99)
POTASSIUM: 5.7 mmol/L — AB (ref 3.5–5.1)
SODIUM: 128 mmol/L — AB (ref 135–145)

## 2015-02-11 LAB — GLUCOSE, CAPILLARY
GLUCOSE-CAPILLARY: 145 mg/dL — AB (ref 65–99)
Glucose-Capillary: 166 mg/dL — ABNORMAL HIGH (ref 65–99)
Glucose-Capillary: 182 mg/dL — ABNORMAL HIGH (ref 65–99)

## 2015-02-11 LAB — EXPECTORATED SPUTUM ASSESSMENT W GRAM STAIN, RFLX TO RESP C

## 2015-02-11 LAB — CULTURE, RESPIRATORY

## 2015-02-11 LAB — CULTURE, RESPIRATORY W GRAM STAIN: Culture: NORMAL

## 2015-02-11 LAB — EXPECTORATED SPUTUM ASSESSMENT W REFEX TO RESP CULTURE

## 2015-02-11 MED ORDER — PRO-STAT SUGAR FREE PO LIQD: 30.0000 mL | Freq: Four times a day (QID) | ORAL | Status: DC

## 2015-02-11 MED ORDER — ATROPINE SULFATE 1 % OP SOLN
4.0000 [drp] | OPHTHALMIC | Status: DC | PRN
  Administered 2015-02-11: 4 [drp] via SUBLINGUAL
  Filled 2015-02-11: qty 2

## 2015-02-11 MED ORDER — VITAL HIGH PROTEIN PO LIQD: 1000.0000 mL | ORAL | Status: DC

## 2015-02-11 MED ORDER — MORPHINE 100MG IN NS 100ML (1MG/ML) PREMIX INFUSION
10.0000 mg/h | INTRAVENOUS | Status: DC
  Administered 2015-02-11: 5 mg/h via INTRAVENOUS
  Filled 2015-02-11: qty 100

## 2015-02-11 MED ORDER — MORPHINE BOLUS VIA INFUSION
5.0000 mg | INTRAVENOUS | Status: DC | PRN
  Filled 2015-02-11: qty 20

## 2015-02-11 MED ORDER — ATROPINE ORAL SOLUTION 0.08 MG/ML: 2.0000 mg | ORAL | Status: DC | PRN

## 2015-02-13 LAB — CULTURE, RESPIRATORY: CULTURE: NO GROWTH

## 2015-02-13 LAB — PATHOLOGIST SMEAR REVIEW

## 2015-02-13 LAB — CYTOLOGY - NON PAP

## 2015-02-22 ENCOUNTER — Other Ambulatory Visit: Payer: Medicare Other

## 2015-02-22 ENCOUNTER — Ambulatory Visit: Payer: Medicare Other | Admitting: Internal Medicine

## 2015-02-22 NOTE — Discharge Summary (Signed)
.  [facility] NOTE  PATIENT NAME: Dean Collins    MR#:  [id number]  DATE OF BIRTH:  1941/01/20  DATE OF ADMISSION:  02/14/2015  PRIMARY CARE PHYSICIAN: SPARKS,JEFFREY D, MD   Pronounced dead  on 03-04-15 at March 29, 2023 PM           CAUSE OF DEATH -  Streptococcus grp A pneumonia.  Other diagnosis during admission  Acute respiratory failure with hypoxia Septic shock Atrial fibrillation with rapid ventricular rate CLL Hypothyroidism Hyperlipidemia Hyponatremia Acute renal failure Anemia of chronic disease Hyperkalemia    ADMISSION DIAGNOSIS:  sob  HISTORY OF PRESENT ILLNESS ON ADMISSION  Dean Collins is a 74 y.o. male with a known history of CLL, prostate cancer, hypothyroidism, anxiety, history of diverticulitis, osteoarthritis, upper thyroidism, hyperlipidemia who presents to the hospital due to shortness of breath and cough. Patient apparently has been having some right hip pain from his previous history of hip replacement and saw his orthopedic surgeon for follow-up today and during his evaluation he noticed the patient was quite short of breath and therefore sent him to the ER for further evaluation. As per the wife patient did not sleep well last night and has been having gurgling sounds and feeling short of breath now since last night. He admits to a cough which is productive of green-yellow sputum, with chills but no documented fever. He denies any chest pain, nausea, abdominal pain, diarrhea or any other associated symptoms. He presented to the hospital and was noted to be in acute respiratory failure with hypoxia likely secondary to pneumonia and hospitalist services were contacted for further treatment and evaluation.  HOSPITAL COURSE:  74 year old male with past medical history of CLL, hypothyroidism, depression, hyperlipidemia, history of prostate cancer, who presented to the hospital due to shortness of breath,  weakness, sputum production and noted to be in acute respiratory failure with hypoxia.  # Acute respiratory failure with hypoxia- Streptococcus grp A pneumonia. -Extubated 11/11 to HFNC 75%.  Re intubated 11/19. -Since he is immunosuppressed given his underlying malignancy treated with broad-spectrum IV antibiotics with vancomycin, Zosyn. - blood CX NTD On PCN, Clinda and Bactrim per ID Nebs. Solumedrol. IVIG for 5 days CT chest showed no PE. Bilateral multifocal pneumonia.  # Septic shock Due to above  # A. fib with RVR due to infection Improved. Amiodarone stopped by pulmonary due to ALI  # CLL-patient's white cell count is significantly elevated. -continued to hold his chemotherapy med given his acute infectious process.  # Hypothyroidism- Synthroid at 125 g per day.   # hyperlipidemia-continue simvastatin.  # depression, continue Zoloft.   Patient in spite of aggressive treatment with IV antibiotics, pressor support deteriorated slowly. Had to be reintubated. Was a 90% oxygen. On further discussing with the family they made the decision to terminally extubate the patient as he did not want to be intubated for a prolonged period of time. Patient was pronounced dead at 12:15 PM on Mar 04, 2015.  Hillary Bow R M.D on 02/19/2015 at 1:40 PM  [facility]  Total clinical and documentation time for today Under 30 minutes

## 2015-02-22 NOTE — Progress Notes (Signed)
Dean Collins   DOB:1941/01/21   [id number]    CC: CLL hyperleukocytosis/respiratory failure  Subjective: Overnight events noted; patient noticed status got worse yesterday afternoon requiring reintubation. CT chest performed showed bilateral pneumonia; consolidation in the lower lobes concerning for aspiration. Patient continues to be on FiO2 at 90%; without any significant improvement on the blood gases.  Review of systems: Is limited because of patient's respiratory distress.   Objective:  Filed Vitals:   2015/03/08 0500  BP: 104/53  Pulse: 73  Temp:   Resp: 20     Intake/Output Summary (Last 24 hours) at 03/08/15 1038 Last data filed at 03/08/2015 0700  Gross per 24 hour  Intake 4104.81 ml  Output   1375 ml  Net 2729.81 ml    GENERAL: Well-nourished well-developed; intubated sedated. Multiple family members at the bedside.   Labs:  Lab Results  Component Value Date   WBC 180.7* 03/08/2015   HGB 8.4* 03-08-15   HCT 26.4* March 08, 2015   MCV 94.1 2015-03-08   PLT 180 03-08-15   NEUTROABS 14.5* 2015-03-08    Lab Results  Component Value Date   NA 128* 03-08-2015   K 5.7* 03-08-15   CL 97* 2015-03-08   CO2 26 03/08/2015    Studies:  Dg Chest 1 View  Mar 08, 2015  CLINICAL DATA:  Dyspnea. History of prostate cancer. Right middle lobe pneumonia. EXAM: CHEST 1 VIEW COMPARISON:  02/10/2015, chest CT FINDINGS: Endotracheal tube, enteric catheter, left subclavian approach injectable port are stable. Cardiomediastinal silhouette is borderline enlarged. Mediastinal contours appear intact. There is no evidence of pneumothorax. There are low lung volumes with probably bilateral small pleural effusions and lower lobe airspace consolidation and/ or atelectasis. Osseous structures are without acute abnormality. Soft tissues are grossly normal. IMPRESSION: No significant change in the aeration of the lungs, with low lung volumes and bilateral lower lobe predominant airspace  consolidation versus atelectasis. Probable small bilateral pleural effusions. Stable supporting lines and tubes. Electronically Signed   By: Fidela Salisbury M.D.   On: 2015-03-08 09:09   Dg Chest 1 View  02/10/2015  CLINICAL DATA:  Post intubation. History of CLL and prostate cancer. Former smoker. EXAM: CHEST 1 VIEW COMPARISON:  02/10/2015 FINDINGS: There is an endotracheal tube with the tip 7 cm above the carina. There is a nasogastric tube coursing below the diaphragm in the left upper quadrant. There are trace bilateral pleural effusions. There is bibasilar hazy airspace disease which may reflect atelectasis. The heart mediastinum are stable. There is a left-sided Port-A-Cath. The osseous structures are unremarkable. IMPRESSION: 1. Endotracheal tube with the tip 7 cm above the carina. 2. Bilateral trace pleural effusions and hazy airspace disease likely reflecting atelectasis. There is likely an element of underlying interstitial edema. Electronically Signed   By: Kathreen Devoid   On: 02/10/2015 17:00   Dg Chest 1 View  02/10/2015  CLINICAL DATA:  Patient admitted on 02/10/2015 for respiratory failure. Subsequent encounter. EXAM: CHEST 1 VIEW COMPARISON:  02/06/2015 and older exams. FINDINGS: Lung volumes remain relatively low. There is persistent opacity at the lung bases most likely combination of atelectasis and small effusions. Mild hazy airspace opacity at the lung bases is also noted which could be due to atelectasis or mild basilar edema. Cardiac silhouette is partly obscured. Is borderline to mildly enlarged. No mediastinal or hilar masses. Anterior chest wall Port-A-Cath is stable. IMPRESSION: 1. No significant change from the most recent prior study. 2. Small pleural effusions with lung base atelectasis.  There may be a component of mild pulmonary edema in the lower lungs bilaterally. No convincing pneumonia. Electronically Signed   By: Lajean Manes M.D.   On: 02/10/2015 10:45   Dg Abd 1  View  02/10/2015  CLINICAL DATA:  Patient status post NG tube placement. History of CLL. EXAM: ABDOMEN - 1 VIEW COMPARISON:  Abdomen radiograph 02/01/2015 FINDINGS: NG tube tip and side-port project over the left upper quadrant, likely within the stomach. Gas is demonstrated within nondilated loops of large and small bowel. No evidence for bowel obstruction. Reservoir within the left lower quadrant. Pelvic surgical clips. Right hip arthroplasty. Lumbar spine degenerative changes. Heterogeneous opacities left lung base. IMPRESSION: NG tube tip and side-port project over the stomach. Electronically Signed   By: Lovey Newcomer M.D.   On: 02/10/2015 16:41   Ct Angio Chest Pe W/cm &/or Wo Cm  02/10/2015  CLINICAL DATA:  Dyspnea.  Hypoxia. EXAM: CT ANGIOGRAPHY CHEST WITH CONTRAST TECHNIQUE: Multidetector CT imaging of the chest was performed using the standard protocol during bolus administration of intravenous contrast. Multiplanar CT image reconstructions and MIPs were obtained to evaluate the vascular anatomy. CONTRAST:  145mL OMNIPAQUE IOHEXOL 350 MG/ML SOLN COMPARISON:  06/21/2010 FINDINGS: Mediastinum: Endotracheal tube tip is above the carina. The heart size is normal. Mild aortic atherosclerosis. No pericardial effusion. The trachea appears patent and is midline. There is a nasogastric tube within the esophagus. The tip is in the stomach. Prominent mediastinal nodes are identified. Index right paratracheal lymph node measures 1.3 cm, image 59 of series 4. Left hilar lymph node is identified measuring 1.4 cm, image 77 of series 4. The main pulmonary artery is patent. No abnormal filling defects are identified within the main pulmonary artery or their branches to suggest a clinically significant acute pulmonary embolus. Lungs/Pleura: No pleural fluid. Dense airspace consolidation involves both lower lobes compatible with either multifocal pneumonia or aspiration. Patchy densities are also identified in the left  upper lobe, posterior right upper lobe. Upper Abdomen: Numerous stones are identified within the gallbladder. These measure up to 1.5 cm, image 146 of series 4. The adrenal glands are both within normal limits. The spleen scratch set the visualized portions of the spleen appear enlarged. Musculoskeletal: Multi level spondylosis identified within the thoracic spine. Review of the MIP images confirms the above findings. IMPRESSION: 1. No evidence for acute pulmonary embolus. 2. Bilateral multifocal airspace opacities are identified. This predominantly affects the lower lobes and may reflect sequelae of pneumonia and/or aspiration. 3. Gallstones. 4. Aortic atherosclerosis. Electronically Signed   By: Kerby Moors M.D.   On: 02/10/2015 17:19    Assessment & Plan:   74 year old Caucasian male patient with a history of CLL/immunocompromise most recently relapsed in September 2016 on second line therapy with ibrutinib currently admitted to the hospital for respiratory distress/respiratory failure.  # Respiratory failure/status- question related to atypical infection-PCP versus others. On streoids; Bactrim; Rocephin/Clindamycin per ID. worsening respiratory failure status post intubation  #CLL- hyperleukocytosis/predominant lymphocytosis.  # A. Fib- currently in sinus rhythm/rate controlled.  Recommendations:  # As per the patient's wishes family do not want to be continued on the mechanical ventilator for a prolonged period of Time. However given the current clinical deterioration/ status it  is most likely patient will need prolonged respiratory care including tracheostomy. Patient/family not in favor. Family in favor of withdrawing care; and more family available.  # Discussed at length with patients wife/daughter. Also discussed with Dr.Ramachandran/critical care specialist.  45 minutes face-to-face  with the patient's family- with more than 50% of time spent on counseling and coordination of medical  care.  Cammie Sickle, MD 2015-03-02  10:38 AM

## 2015-02-22 NOTE — Progress Notes (Signed)
Nutrition Follow-up    INTERVENTION:   EN: Vital High Protein started yesterday vis Adult Tube Feeding Protocol at rate of 25 ml/hr. With current diprivan, goal rate of TF is 30 ml/hr with addition of Prostat QID providing 1120 kcals, 123 g of protein. Additional kcals from diprivan, meets >80% protein needs based on ASPEN guidelines. At this goal rate, provides 1152 mg of potassium over 24 hour period. Noted pt also receiving potassium-containing medicine. If potassium remains elevated, may need to consider switching formula to Nepro, however will be difficult to meet protein needs with this formula, especially while on diprivan. Continue to assess   NUTRITION DIAGNOSIS:   Inadequate oral intake related to acute illness as evidenced by NPO status. Being addressed via TF  GOAL:   Patient will meet greater than or equal to 90% of their needs  MONITOR:    (Energy Intake, Anthropometrics, Digestive System, Pulmonary, Electrolyte/Renal Profile)  REASON FOR ASSESSMENT:   Consult Enteral/tube feeding initiation and management  ASSESSMENT:    Pt with decline in respiratory status yesterday requiring intubation  EN: Vital High Protein started yesterday at rate of 25 ml/hr per MD order  Digestive System: no signs of TF intolerance, abdomen soft, +BM 11/19  Electrolyte and Renal Profile:  Recent Labs Lab 02/09/15 0531 02/09/15 1124 02/10/15 1018 February 24, 2015 0352  BUN 35*  --  33* 36*  CREATININE 1.14  --  1.30* 1.72*  NA 135  --  132* 128*  K 5.4* 4.7 4.4 5.7*  MG 2.2  --   --   --   PHOS 4.0  --   --   --    Glucose Profile:   Recent Labs  02/10/15 2355 02-24-15 0341 February 24, 2015 0747  GLUCAP 182* 166* 145*   Meds: fentanyl, ss novolog, solumedrol, penicillin G potassium, diprivan (470 kcals in 24 hours)  Skin:  Reviewed, no issues  Height:   Ht Readings from Last 1 Encounters:  02/08/2015 5\' 10"  (1.778 m)    Weight:   Wt Readings from Last 1 Encounters:  02-24-15  246 lb 7.6 oz (111.8 kg)    BMI:  Body mass index is 35.37 kg/(m^2).  Estimated Nutritional Needs:   Kcal:  [id number] kcals (11-14 kcals/kg)   Protein:  150 g (2.0 g/kg) using IBW 75 kg  Fluid:  1875-2250 mL (25-30 ml/kg)   HIGH Care Level  Kerman Passey MS, RD, LDN [phone] Pager

## 2015-02-22 NOTE — Progress Notes (Signed)
   Feb 28, 2015 0800  Clinical Encounter Type  Visited With Patient and family together  Visit Type Follow-up  Referral From Nurse  Consult/Referral To Chaplain  Spiritual Encounters  Spiritual Needs Emotional  Stress Factors  Patient Stress Factors Not reviewed  Family Stress Factors Health changes  Chaplain rounded in the unit and offered support and comfort to the family as they processed patient's health updates. Notified doctor and nurse of request to speak with doctor as available. Chaplain Soya A. Natale Thoma Ext. [phone]

## 2015-02-22 NOTE — Progress Notes (Signed)
Funeral home arrived and picked up patient.

## 2015-02-22 NOTE — Progress Notes (Signed)
[facility] [place] NAME: Dean Collins    MR#:  [id number]  DATE OF BIRTH:  [birth year]  SUBJECTIVE:  Admitted 11/9 for PNA on Bipap and later Intubated. Extubated 11/11 to HFNC. Re intubated yesterday. Now hypotensive and on 90% O2. Family at bedside.  REVIEW OF SYSTEMS:   Sedated on vent.   DRUG ALLERGIES:  No Known Allergies  VITALS:  Blood pressure 104/53, pulse 73, temperature 98.6 F (37 C), temperature source Oral, resp. rate 20, height 5\' 10"  (1.778 m), weight 111.8 kg (246 lb 7.6 oz), SpO2 94 %.  PHYSICAL EXAMINATION:  GENERAL:  74 y.o.-year-old patient lying in the bed on Vent. Looks critically ill EYES: Pupils equal, round, reactive to light and accommodation. No scleral icterus. HEENT: Head atraumatic, normocephalic.  NECK:  Supple, no jugular venous distention. No thyroid enlargement, no tenderness.  LUNGS: Bilateral coarse BS CARDIOVASCULAR: Irregularly irregular. No murmurs, rubs, or gallops.  ABDOMEN: Soft, nontender, nondistended. Bowel sounds present. No organomegaly or mass.  EXTREMITIES: No pedal edema, cyanosis, or clubbing.  NEUROLOGIC: Patient is awake, alert and oriented, follows commands. Power 4/5 in all extremities. PSYCHIATRIC: Sedated SKIN: No obvious rash, lesion, or ulcer.   Decreased hearing   LABORATORY PANEL:   CBC  Recent Labs Lab Feb 13, 2015 0352  WBC 180.7*  HGB 8.4*  HCT 26.4*  PLT 180   ------------------------------------------------------------------------------------------------------------------  Chemistries   Recent Labs Lab 02/09/15 0531  13-Feb-2015 0352  NA 135  < > 128*  K 5.4*  < > 5.7*  CL 99*  < > 97*  CO2 30  < > 26  GLUCOSE 306*  < > 182*  BUN 35*  < > 36*  CREATININE 1.14  < > 1.72*  CALCIUM 8.6*  < > 7.7*  MG 2.2  --   --   < > = values in this interval not  displayed. ------------------------------------------------------------------------------------------------------------------  Cardiac Enzymes No results for input(s): TROPONINI in the last 168 hours. ------------------------------------------------------------------------------------------------------------------  RADIOLOGY:  Dg Chest 1 View  02-13-2015  CLINICAL DATA:  Dyspnea. History of prostate cancer. Right middle lobe pneumonia. EXAM: CHEST 1 VIEW COMPARISON:  02/10/2015, chest CT FINDINGS: Endotracheal tube, enteric catheter, left subclavian approach injectable port are stable. Cardiomediastinal silhouette is borderline enlarged. Mediastinal contours appear intact. There is no evidence of pneumothorax. There are low lung volumes with probably bilateral small pleural effusions and lower lobe airspace consolidation and/ or atelectasis. Osseous structures are without acute abnormality. Soft tissues are grossly normal. IMPRESSION: No significant change in the aeration of the lungs, with low lung volumes and bilateral lower lobe predominant airspace consolidation versus atelectasis. Probable small bilateral pleural effusions. Stable supporting lines and tubes. Electronically Signed   By: Fidela Salisbury M.D.   On: 02/13/2015 09:09   Dg Chest 1 View  02/10/2015  CLINICAL DATA:  Post intubation. History of CLL and prostate cancer. Former smoker. EXAM: CHEST 1 VIEW COMPARISON:  02/10/2015 FINDINGS: There is an endotracheal tube with the tip 7 cm above the carina. There is a nasogastric tube coursing below the diaphragm in the left upper quadrant. There are trace bilateral pleural effusions. There is bibasilar hazy airspace disease which may reflect atelectasis. The heart mediastinum are stable. There is a left-sided Port-A-Cath. The osseous structures are unremarkable. IMPRESSION: 1. Endotracheal tube with the tip 7 cm above the carina. 2. Bilateral trace pleural effusions and hazy airspace disease  likely reflecting atelectasis. There is likely an element of underlying  interstitial edema. Electronically Signed   By: Kathreen Devoid   On: 02/10/2015 17:00   Dg Chest 1 View  02/10/2015  CLINICAL DATA:  Patient admitted on 01/23/2015 for respiratory failure. Subsequent encounter. EXAM: CHEST 1 VIEW COMPARISON:  02/06/2015 and older exams. FINDINGS: Lung volumes remain relatively low. There is persistent opacity at the lung bases most likely combination of atelectasis and small effusions. Mild hazy airspace opacity at the lung bases is also noted which could be due to atelectasis or mild basilar edema. Cardiac silhouette is partly obscured. Is borderline to mildly enlarged. No mediastinal or hilar masses. Anterior chest wall Port-A-Cath is stable. IMPRESSION: 1. No significant change from the most recent prior study. 2. Small pleural effusions with lung base atelectasis. There may be a component of mild pulmonary edema in the lower lungs bilaterally. No convincing pneumonia. Electronically Signed   By: Lajean Manes M.D.   On: 02/10/2015 10:45   Dg Abd 1 View  02/10/2015  CLINICAL DATA:  Patient status post NG tube placement. History of CLL. EXAM: ABDOMEN - 1 VIEW COMPARISON:  Abdomen radiograph 02/01/2015 FINDINGS: NG tube tip and side-port project over the left upper quadrant, likely within the stomach. Gas is demonstrated within nondilated loops of large and small bowel. No evidence for bowel obstruction. Reservoir within the left lower quadrant. Pelvic surgical clips. Right hip arthroplasty. Lumbar spine degenerative changes. Heterogeneous opacities left lung base. IMPRESSION: NG tube tip and side-port project over the stomach. Electronically Signed   By: Lovey Newcomer M.D.   On: 02/10/2015 16:41   Ct Angio Chest Pe W/cm &/or Wo Cm  02/10/2015  CLINICAL DATA:  Dyspnea.  Hypoxia. EXAM: CT ANGIOGRAPHY CHEST WITH CONTRAST TECHNIQUE: Multidetector CT imaging of the chest was performed using the standard  protocol during bolus administration of intravenous contrast. Multiplanar CT image reconstructions and MIPs were obtained to evaluate the vascular anatomy. CONTRAST:  112mL OMNIPAQUE IOHEXOL 350 MG/ML SOLN COMPARISON:  06/21/2010 FINDINGS: Mediastinum: Endotracheal tube tip is above the carina. The heart size is normal. Mild aortic atherosclerosis. No pericardial effusion. The trachea appears patent and is midline. There is a nasogastric tube within the esophagus. The tip is in the stomach. Prominent mediastinal nodes are identified. Index right paratracheal lymph node measures 1.3 cm, image 59 of series 4. Left hilar lymph node is identified measuring 1.4 cm, image 77 of series 4. The main pulmonary artery is patent. No abnormal filling defects are identified within the main pulmonary artery or their branches to suggest a clinically significant acute pulmonary embolus. Lungs/Pleura: No pleural fluid. Dense airspace consolidation involves both lower lobes compatible with either multifocal pneumonia or aspiration. Patchy densities are also identified in the left upper lobe, posterior right upper lobe. Upper Abdomen: Numerous stones are identified within the gallbladder. These measure up to 1.5 cm, image 146 of series 4. The adrenal glands are both within normal limits. The spleen scratch set the visualized portions of the spleen appear enlarged. Musculoskeletal: Multi level spondylosis identified within the thoracic spine. Review of the MIP images confirms the above findings. IMPRESSION: 1. No evidence for acute pulmonary embolus. 2. Bilateral multifocal airspace opacities are identified. This predominantly affects the lower lobes and may reflect sequelae of pneumonia and/or aspiration. 3. Gallstones. 4. Aortic atherosclerosis. Electronically Signed   By: Kerby Moors M.D.   On: 02/10/2015 17:19      ASSESSMENT AND PLAN:   74 year old male with past medical history of CLL, hypothyroidism, depression,  hyperlipidemia, history of  prostate cancer, who presented to the hospital due to shortness of breath, weakness, sputum production and noted to be in acute respiratory failure with hypoxia.  # Acute respiratory failure with hypoxia- Streptococcus grp A pneumonia. -Extubated 11/11 to HFNC 75%.  Re intubated 11/19. -Since he is immunosuppressed given his underlying malignancy treated with broad-spectrum IV antibiotics with vancomycin, Zosyn. - blood CX NTD On PCN, Clinda and Bactrim per ID Nebs. Solumedrol. IVIG for 5 days CT chest showed no PE. Bilateral multifocal pneumonia.  Discussed with Dr. Juanell Fairly regarding a bronchoscopy. Pt had deep suction with saline which showed thick secretions. Sent for cx. Family as per patient's wishes do not want patient on prolonged Vent support.   # Septic shock Due to above  # A. fib with RVR due to infection Improved. Amiodarone stopped by pulmonary due to ALI  # CLL-patient's white cell count is significantly elevated. -Appreciate oncology recommendations -continue to  hold his chemotherapy med given his acute infectious process.  # Hypothyroidism-continue Synthroid at 125 g per day.   # hyperlipidemia-continue simvastatin.  # depression, continue Zoloft.   Discussed with Dr. Juanell Fairly  CODE STATUS: full code.  TOTAL CRITICAL TIME TAKING CARE OF THIS PATIENT: 35 minutes.    Hillary Bow R M.D on 02-14-2015 at 9:13 AM  Between 7am to 6pm - Pager - [phone] After 6pm go to [email or web address] - password [email or web address] [facility]  Office  [phone]  CC: Primary care physician; Idelle Crouch, MD

## 2015-02-22 NOTE — Progress Notes (Signed)
CRITICAL VALUE ALERT  Critical value received: WBC 180.7 Date of notification:  2015/02/25 Time of notification:  0500 Critical value read back: yes  Nurse who received alert: bbuono MD notified (1st page):  0530 Time of first page:    MD notified (2nd page):  Time of second page:  Responding MD: diamond Time MD responded: [id number]

## 2015-02-22 NOTE — Progress Notes (Signed)
Per MD request may have RASS of -3

## 2015-02-22 NOTE — Progress Notes (Signed)
   2015-03-11 1300  Clinical Encounter Type  Visited With Patient;Family;Patient and family together;Patient not available;Health care provider  Visit Type Follow-up;Death;Patient actively dying  Referral From Nurse  Consult/Referral To Chaplain  Spiritual Encounters  Spiritual Needs Emotional;Grief support  Stress Factors  Patient Stress Factors None identified  Family Stress Factors Loss;Major life changes  Chaplain rounded in the unit and was asked to support family as things were not looking good for patient. Offered a compassionate presence and spiritual support while patient was extubated and actively dying.  Chaplain Katessa Attridge A. Vedder Brittian Ext. [phone]

## 2015-02-22 NOTE — Progress Notes (Signed)
eLink Physician-Brief Progress Note Patient Name: Dean Collins DOB: 06/18/1940 MRN: [id number]   Date of Service  Feb 12, 2015  HPI/Events of Note  ABG on 100%/PRVC 20/TV 450/P 10 = 7.23/63/114. Ppeak = 23 and Pplat = 21.  eICU Interventions  Will order: 1. Increase TV to 550 and PRVC rate to 24. 2. ABG at 7:15 AM.     Intervention Category Major Interventions: Respiratory failure - evaluation and management  Sommer,Steven Eugene 02-12-2015, 5:57 AM

## 2015-02-22 NOTE — Progress Notes (Signed)
Patient is placed on high fowlers position, cuff deflated suctioned orally and endotracheally and then extubated to room air

## 2015-02-22 NOTE — Progress Notes (Addendum)
   02-19-2015 1215  Vitals  ECG Heart Rate (!) 0  Resp (!) 0   Patient expired at this time. No vital signs or respirations noted, family at bedside with chaplain, emotional support provided to family. Information obtained regarding funeral arrangements and form filled out. Family at bedside. Jeanene Erb RN and myself pronounced at this time.

## 2015-02-22 NOTE — Progress Notes (Signed)
Notified Dr. Felicie Morn that patients family is now ready to extubate patient for comfort care. Discussed orders for his care and instructed to discontinue propofol once extubated and start morphine drip. Also instructed to leave fentanyl drip at current dose for comfort. Medication also ordered for secretions. Let him know that family wanted to talk with him again and no other changes at this time. Will continue to assess and monitor.

## 2015-02-22 NOTE — Progress Notes (Signed)
CC: follow up SOB, hypoxia  HPI: The patient continues to be on the ventilator after intubation yesterday. His FiO2 has been increased to 90%, the tidal volume and respiratory rate were increased by the E-Link physician due to persistent hypercapnia. Despite these changes. He continues to be worse today with increased CO2 levels.  Review of the CT of the chest performed on 02/10/2015 showed continued bibasilar atelectasis and pneumonia evidence of pulmonary embolism. There are numerous gallstones, bilateral diaphragms are elevated due to hepatosplenomegaly.   Filed Vitals:   2015-03-06 0310 Mar 06, 2015 0400 03-06-15 0500 03/06/2015 0752  BP:  103/55 104/53   Pulse:  73 73   Temp:  98.6 F (37 C)    TempSrc:      Resp:  20 20   Height:      Weight:   111.8 kg (246 lb 7.6 oz)   SpO2: 95% 95% 95% 94%   Frail,  HEENT WNL No JVD noted No wheezes, diffuse coarse rhonchi s1s2 noted no M noted NABS, soft Ext warm, no edema    EXCELLENT SPECIMEN - 90-100% WBCS  MANY WBC SEEN  MANY GRAM POSITIVE COCCI IN PAIRS IN CHAINS  FEW GRAM NEGATIVE COCCOBACILLI       Culture RARE GROWTH GROUP A STREP (S.PYOGENES) ISOLATED              BMP Latest Ref Rng 03/06/15 02/10/2015 02/09/2015  Glucose 65 - 99 mg/dL 182(H) 250(H) -  BUN 6 - 20 mg/dL 36(H) 33(H) -  Creatinine 0.61 - 1.24 mg/dL 1.72(H) 1.30(H) -  Sodium 135 - 145 mmol/L 128(L) 132(L) -  Potassium 3.5 - 5.1 mmol/L 5.7(H) 4.4 4.7  Chloride 101 - 111 mmol/L 97(L) 99(L) -  CO2 22 - 32 mmol/L 26 27 -  Calcium 8.9 - 10.3 mg/dL 7.7(L) 8.4(L) -    CBC Latest Ref Rng 06-Mar-2015 02/10/2015 02/08/2015  WBC 3.8 - 10.6 K/uL 180.7(HH) 155.1(HH) 133.5(HH)  Hemoglobin 13.0 - 18.0 g/dL 8.4(L) 9.7(L) 9.6(L)  Hematocrit 40.0 - 52.0 % 26.4(L) 33.0(L) 32.2(L)  Platelets 150 - 440 K/uL 180 185 136(L)   Results for orders placed or performed during the hospital encounter of 01/30/2015  Blood Culture (routine x 2)     Status: None (Preliminary  result)   Collection Time: 02/01/2015  5:12 PM  Result Value Ref Range Status   Specimen Description BLOOD RIGHT HAND  Final   Special Requests BOTTLES DRAWN AEROBIC AND ANAEROBIC [place]  Final   Culture NO GROWTH 5 DAYS  Final   Report Status PENDING  Incomplete  Blood Culture (routine x 2)     Status: None   Collection Time: 02/09/2015  5:13 PM  Result Value Ref Range Status   Specimen Description BLOOD RIGHT ARM  Final   Special Requests BOTTLES DRAWN AEROBIC AND ANAEROBIC [place]  Final   Culture NO GROWTH 5 DAYS  Final   Report Status 02/05/2015 FINAL  Final  Culture, sputum-assessment     Status: None   Collection Time: 02/17/2015  5:20 PM  Result Value Ref Range Status   Specimen Description SPUTUM  Final   Special Requests Immunocompromised  Final   Sputum evaluation THIS SPECIMEN IS ACCEPTABLE FOR SPUTUM CULTURE  Final   Report Status 02/19/2015 FINAL  Final  Urine culture     Status: None   Collection Time: 01/27/2015  5:20 PM  Result Value Ref Range Status   Specimen Description URINE, RANDOM  Final   Special Requests Immunocompromised  Final  Culture INSIGNIFICANT GROWTH  Final   Report Status 02/04/2015 FINAL  Final  Culture, respiratory (NON-Expectorated)     Status: None   Collection Time: 02/09/2015  5:20 PM  Result Value Ref Range Status   Specimen Description SPUTUM  Final   Special Requests Immunocompromised Reflexed from [id number]  Final   Gram Stain   Final    GOOD SPECIMEN - 80-90% WBCS MODERATE WBC SEEN MANY GRAM POSITIVE COCCI IN CHAINS IN CLUSTERS MANY GRAM NEGATIVE COCCOBACILLI    Culture   Final    HEAVY GROWTH GROUP A STREP (S.PYOGENES) ISOLATED There is no known Penicillin Resistant Beta Streptococcus in the [place] For patients that are Penicillin-allergic, Erythromycin is 85-94% susceptible, and Clindamycin is 80% susceptible.  Contact Microbiology within 7 days if sensitivity testing is  required.      Report Status 02/02/2015 FINAL  Final  MRSA PCR Screening      Status: None   Collection Time: 01/26/2015 10:47 PM  Result Value Ref Range Status   MRSA by PCR NEGATIVE NEGATIVE Final    Comment:        The GeneXpert MRSA Assay (FDA approved for NASAL specimens only), is one component of a comprehensive MRSA colonization surveillance program. It is not intended to diagnose MRSA infection nor to guide or monitor treatment for MRSA infections.   Culture, respiratory (NON-Expectorated)     Status: None   Collection Time: 02/01/15  1:40 AM  Result Value Ref Range Status   Specimen Description TRACHEAL ASPIRATE  Final   Special Requests NONE  Final   Gram Stain   Final    EXCELLENT SPECIMEN - 90-100% WBCS MANY WBC SEEN MANY GRAM POSITIVE COCCI IN PAIRS IN CHAINS FEW GRAM NEGATIVE COCCOBACILLI    Culture   Final    RARE GROWTH GROUP A STREP (S.PYOGENES) ISOLATED There is no known Penicillin Resistant Beta Streptococcus in the [place] For patients that are Penicillin-allergic, Erythromycin is 85-94% susceptible, and Clindamycin is 80% susceptible.  Contact Microbiology within 7 days if sensitivity testing is  required.      Report Status 02/06/2015 FINAL  Final  Urine culture     Status: None   Collection Time: 02/02/15  2:35 PM  Result Value Ref Range Status   Specimen Description URINE, RANDOM  Final   Special Requests Immunocompromised  Final   Culture NO GROWTH 2 DAYS  Final   Report Status 02/04/2015 FINAL  Final  Culture, expectorated sputum-assessment     Status: None   Collection Time: 02/09/15 11:24 AM  Result Value Ref Range Status   Specimen Description EXPECTORATED SPUTUM  Final   Special Requests Immunocompromised  Final   Sputum evaluation THIS SPECIMEN IS ACCEPTABLE FOR SPUTUM CULTURE  Final   Report Status 02/09/2015 FINAL  Final  Culture, respiratory (NON-Expectorated)     Status: None (Preliminary result)   Collection Time: 02/09/15 11:24 AM  Result Value Ref Range Status   Specimen Description EXPECTORATED SPUTUM  Final    Special Requests Immunocompromised Reflexed from [id number]  Final   Gram Stain PENDING  Incomplete   Culture TOO YOUNG TO READ  Final   Report Status PENDING  Incomplete   Anti-infectives    Start     Dose/Rate Route Frequency Ordered Stop   02/08/15 2200  clindamycin (CLEOCIN) IVPB 900 mg     900 mg 100 mL/hr over 30 Minutes Intravenous 3 times per day 02/08/15 1803     02/08/15 2000  penicillin G potassium 4  Million Units in dextrose 5 % 250 mL IVPB     4 Million Units 250 mL/hr over 60 Minutes Intravenous 6 times per day 02/08/15 1803     02/08/15 1200  sulfamethoxazole-trimethoprim (BACTRIM DS,SEPTRA DS) 800-160 MG per tablet 2 tablet     2 tablet Oral 3 times daily 02/08/15 1121     02/02/15 1500  cefTRIAXone (ROCEPHIN) 2 g in dextrose 5 % 50 mL IVPB  Status:  Discontinued     2 g 100 mL/hr over 30 Minutes Intravenous Every 24 hours 02/02/15 1424 02/08/15 1803   02/01/15 0700  vancomycin (VANCOCIN) IVPB 1000 mg/200 mL premix  Status:  Discontinued     1,000 mg 200 mL/hr over 60 Minutes Intravenous Every 12 hours 02/13/2015 2229 02/02/15 1042   02/20/2015 2245  piperacillin-tazobactam (ZOSYN) IVPB 4.5 g  Status:  Discontinued     4.5 g 25 mL/hr over 240 Minutes Intravenous 3 times per day 01/28/2015 2235 02/02/15 1424   02/15/2015 1800  vancomycin (VANCOCIN) IVPB 1000 mg/200 mL premix     1,000 mg 200 mL/hr over 60 Minutes Intravenous  Once 01/29/2015 1759 02/13/2015 2006   02/01/2015 1800  cefTRIAXone (ROCEPHIN) 1 g in dextrose 5 % 50 mL IVPB     1 g 100 mL/hr over 30 Minutes Intravenous  Once 02/18/2015 1759 01/24/2015 1952    TTE 11/11: LVEF 55 - 60%  CXR: RLL atelectasis/collapse   IMPRESSION: Acute respiratory failure-findings c/w ALI/ARDS with Strep Pyogenes pneumonia ARDS due to above. Septic shock with hypotension. Acute kidney injury secondary to above. New onset AF > NSR on amiodarone Hypokalemia, resolved Thrombocytopenia, improving CLL with   leukocytosis   PLAN/REC: Continue FiO2, PEEP was increased today. -Performed a mini BAL with installation of 60 mL of sterile saline, the aspirate was thick and mucus filled with blood tinged sputum. This was sent to lab for cytology and culture. Continue abx as per ID for strep pyogens pneumonia.   --The family feels that he would not want to be maintained on the ventilator, I discussed with them that his chances of recovery are poor and would likely require a prolonged course on the ventilator and potentially a tracheostomy. They maintained that he would not want to be maintained on the ventilator any longer than he has been. They're therefore, would like to make his CODE STATUS DO NOT RESUSCITATE, and they're considering a terminal wean later today, after further family's arrival.  I discussed the case in depth with the patient's family at bedside, critical care nurse, respiratory therapist, and oncology.  Marda Stalker M.D.   Critical Care Attestation.  I have personally obtained a history, examined the patient, evaluated laboratory and imaging results, formulated the assessment and plan and placed orders. The Patient requires high complexity decision making for assessment and support, frequent evaluation and titration of therapies, application of advanced monitoring technologies and extensive interpretation of multiple databases. The patient has critical illness that could lead imminently to failure of 1 or more organ systems and requires the highest level of physician preparedness to intervene.  Critical Care Time devoted to patient care services described in this note is 35 minutes and is exclusive of time spent in procedures.

## 2015-02-22 DEATH — deceased

## 2015-03-02 LAB — CULTURE, BLOOD (ROUTINE X 2): CULTURE: NO GROWTH

## 2015-04-09 ENCOUNTER — Other Ambulatory Visit: Payer: Self-pay | Admitting: Internal Medicine

## 2015-06-08 ENCOUNTER — Other Ambulatory Visit: Payer: Self-pay | Admitting: Nurse Practitioner

## 2016-10-04 IMAGING — CR DG CHEST 1V PORT
1 series · 1 of 1 positions shown · non-contrast
Comparison: January 30, 2014.

CLINICAL DATA: No history given.

EXAM:
PORTABLE CHEST - 1 VIEW

[ap]
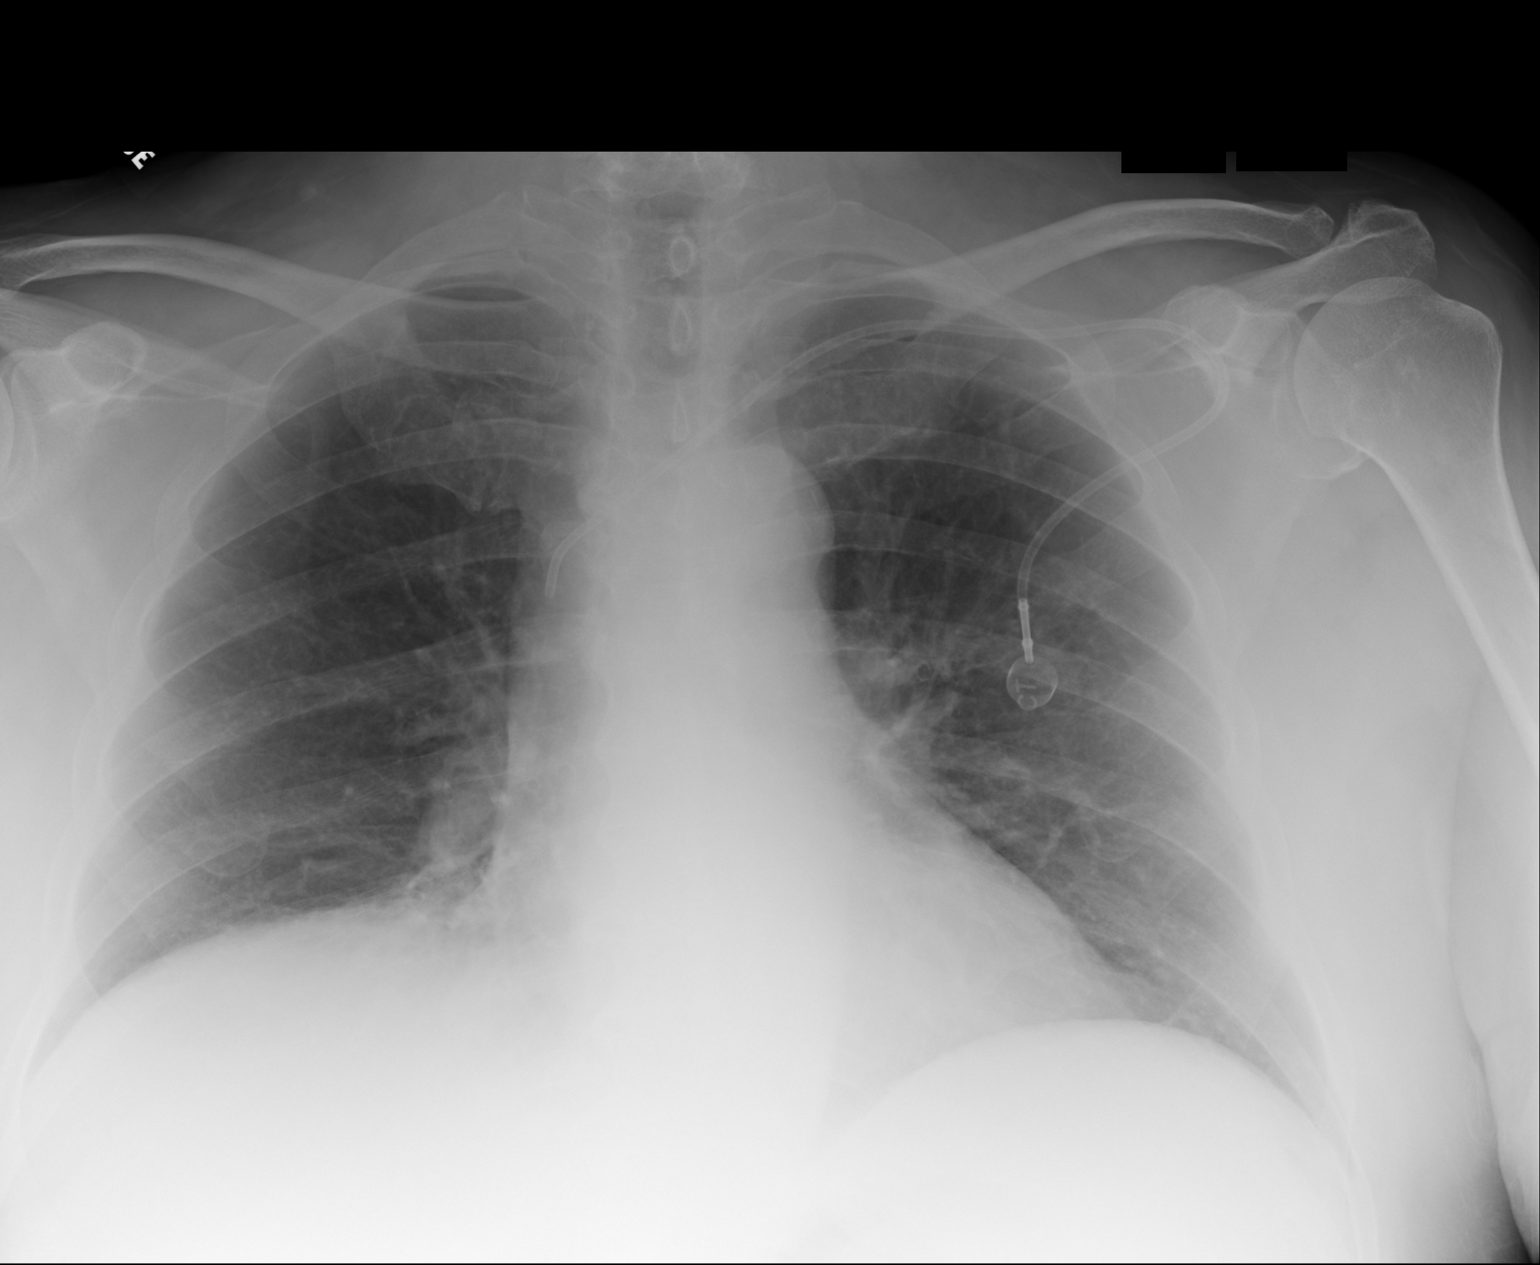

[1 of 1 positions shown; findings below may reference images not displayed]

FINDINGS: The heart size and mediastinal contours are within normal limits.
Both lungs are clear. No pneumothorax or pleural effusion is noted.
Left subclavian Port-A-Cath is noted with distal tip overlying
expected position of SVC. The visualized skeletal structures are
unremarkable.
IMPRESSION: No acute cardiopulmonary abnormality seen.

## 2016-10-05 IMAGING — CR DG C-ARM 1-60 MIN-NO REPORT
1 series · 1 of 1 positions shown · non-contrast
Comparison: CT 04/12/2014

CLINICAL DATA: Patient status post right hip arthroplasty.

EXAM:
OPERATIVE RIGHT HIP WITH PELVIS; DG C-ARM 1-60 MIN - NRPT [facility]

[cont.]
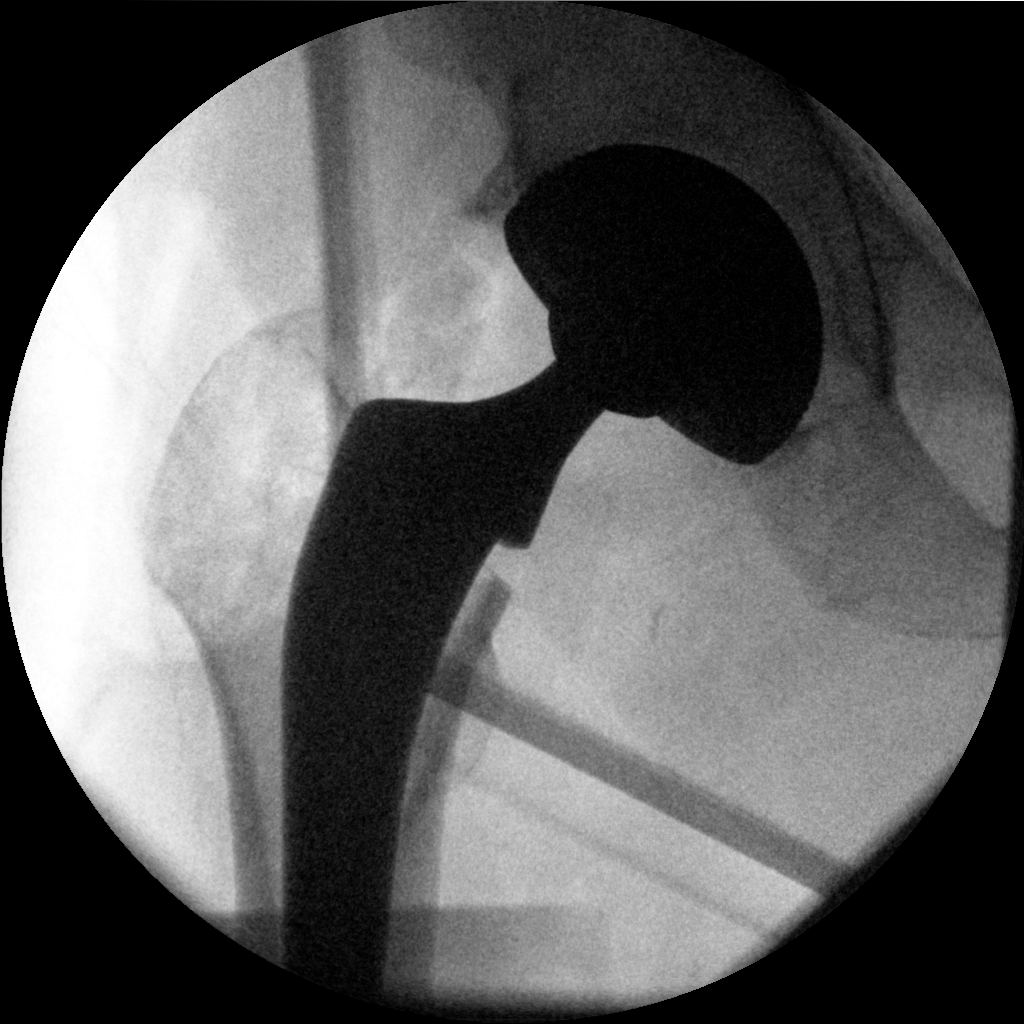

[1 of 1 positions shown; findings below may reference images not displayed]

FINDINGS: Single intraoperative evaluation demonstrates right hip
arthroplasty.
IMPRESSION: Intraoperative fluoroscopic evaluation demonstrates right hip
arthroplasty.

## 2016-10-05 IMAGING — CR DG HIP (WITH OR WITHOUT PELVIS) 1V PORT*R*
1 series · 2 of 2 positions shown · non-contrast
Comparison: Further same day

CLINICAL DATA: Patient status post right hip arthroplasty.

EXAM:
RIGHT HIP (WITH PELVIS) 1 VIEW PORTABLE

[Series 1: ap · 0.17mm/px · 2 of 2 slices shown]
[im 1/2]
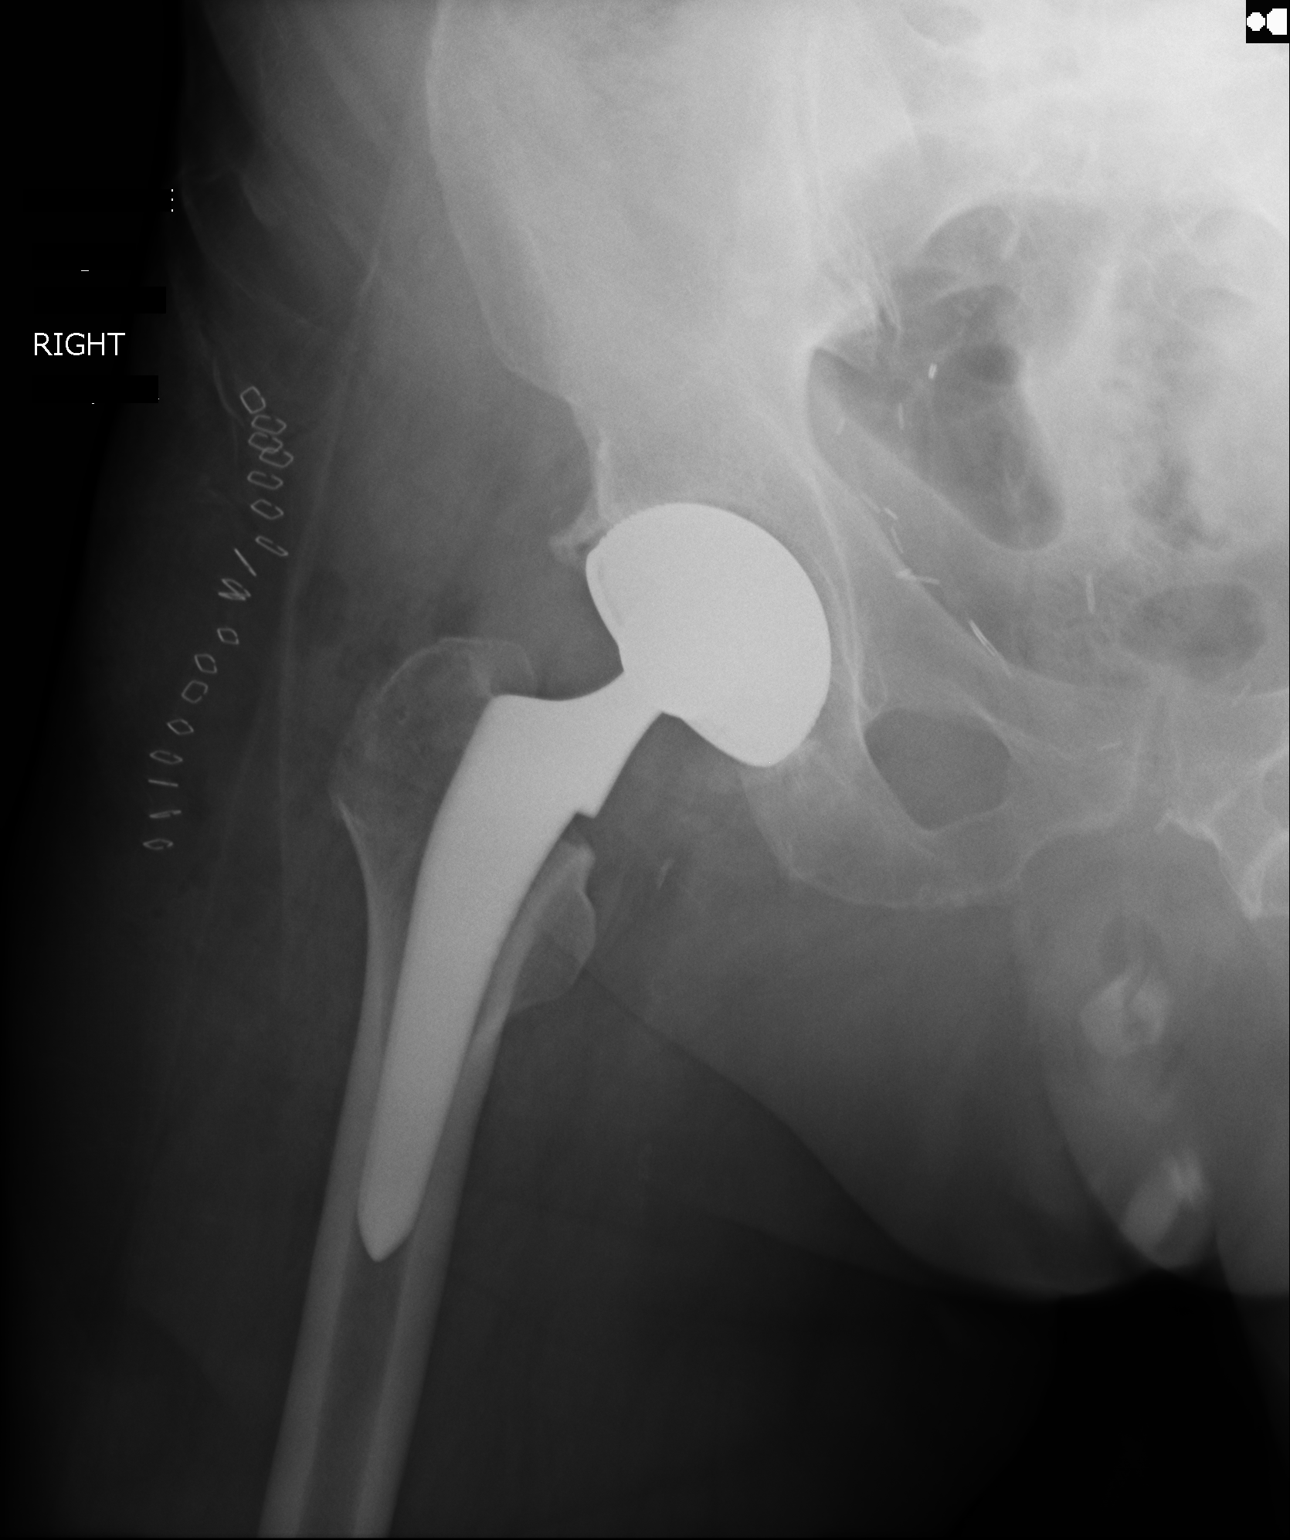
[im 2/2]
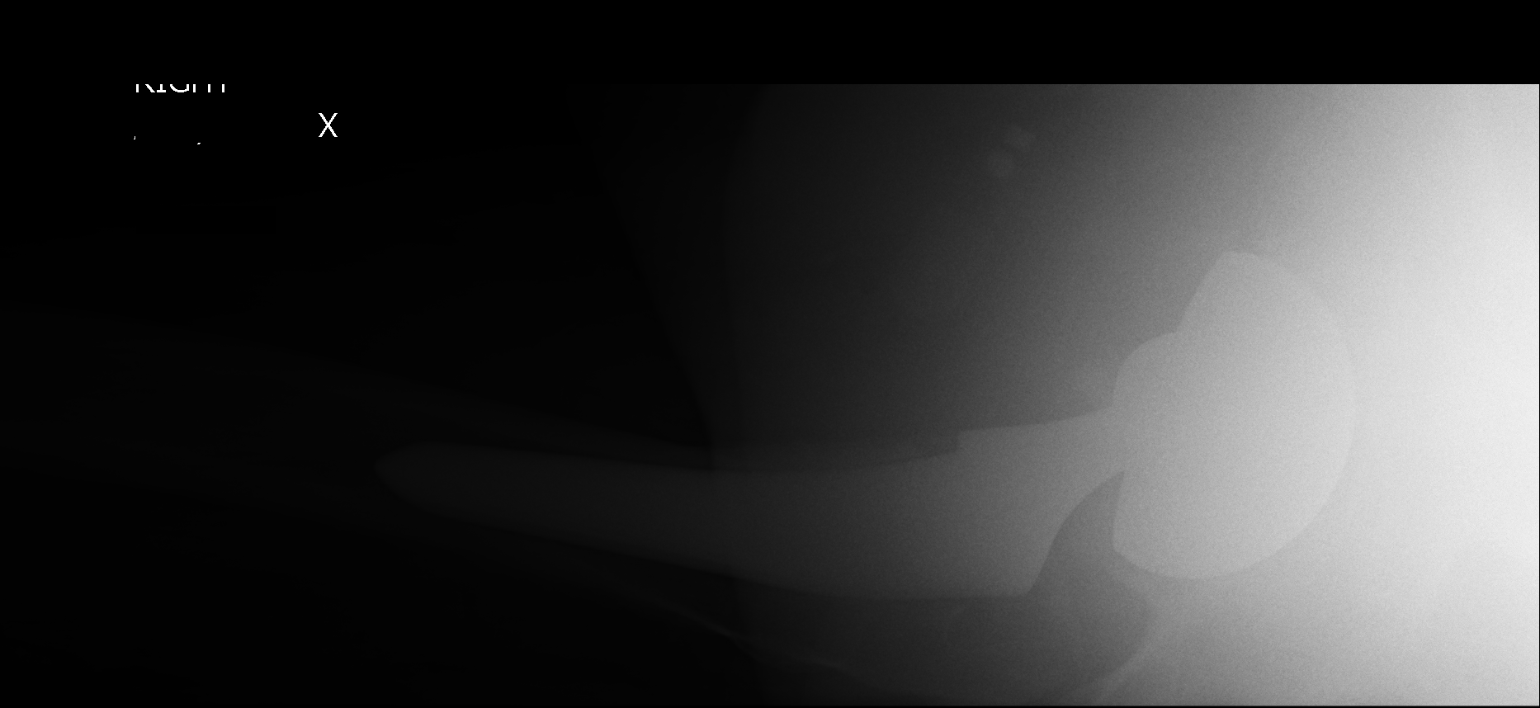

[2 of 2 positions shown; findings below may reference images not displayed]

FINDINGS: Patient status post right hip arthroplasty. Hardware appears intact.
Right hemipelvis surgical clips. Surgical staples overlying the
proximal right lower extremity. Small amount of gas within the
associated soft tissues.
IMPRESSION: Patient status post right hip arthroplasty.

## 2016-10-07 IMAGING — CR DG CHEST 2V
1 series · 2 of 2 positions shown · non-contrast
Comparison: August 11, 2014

CLINICAL DATA: Difficulty breathing

EXAM:
CHEST  2 VIEW

[Series 1: dg chest 2 view · 0.14mm/px · 2 of 2 slices shown]
[im 1/2]
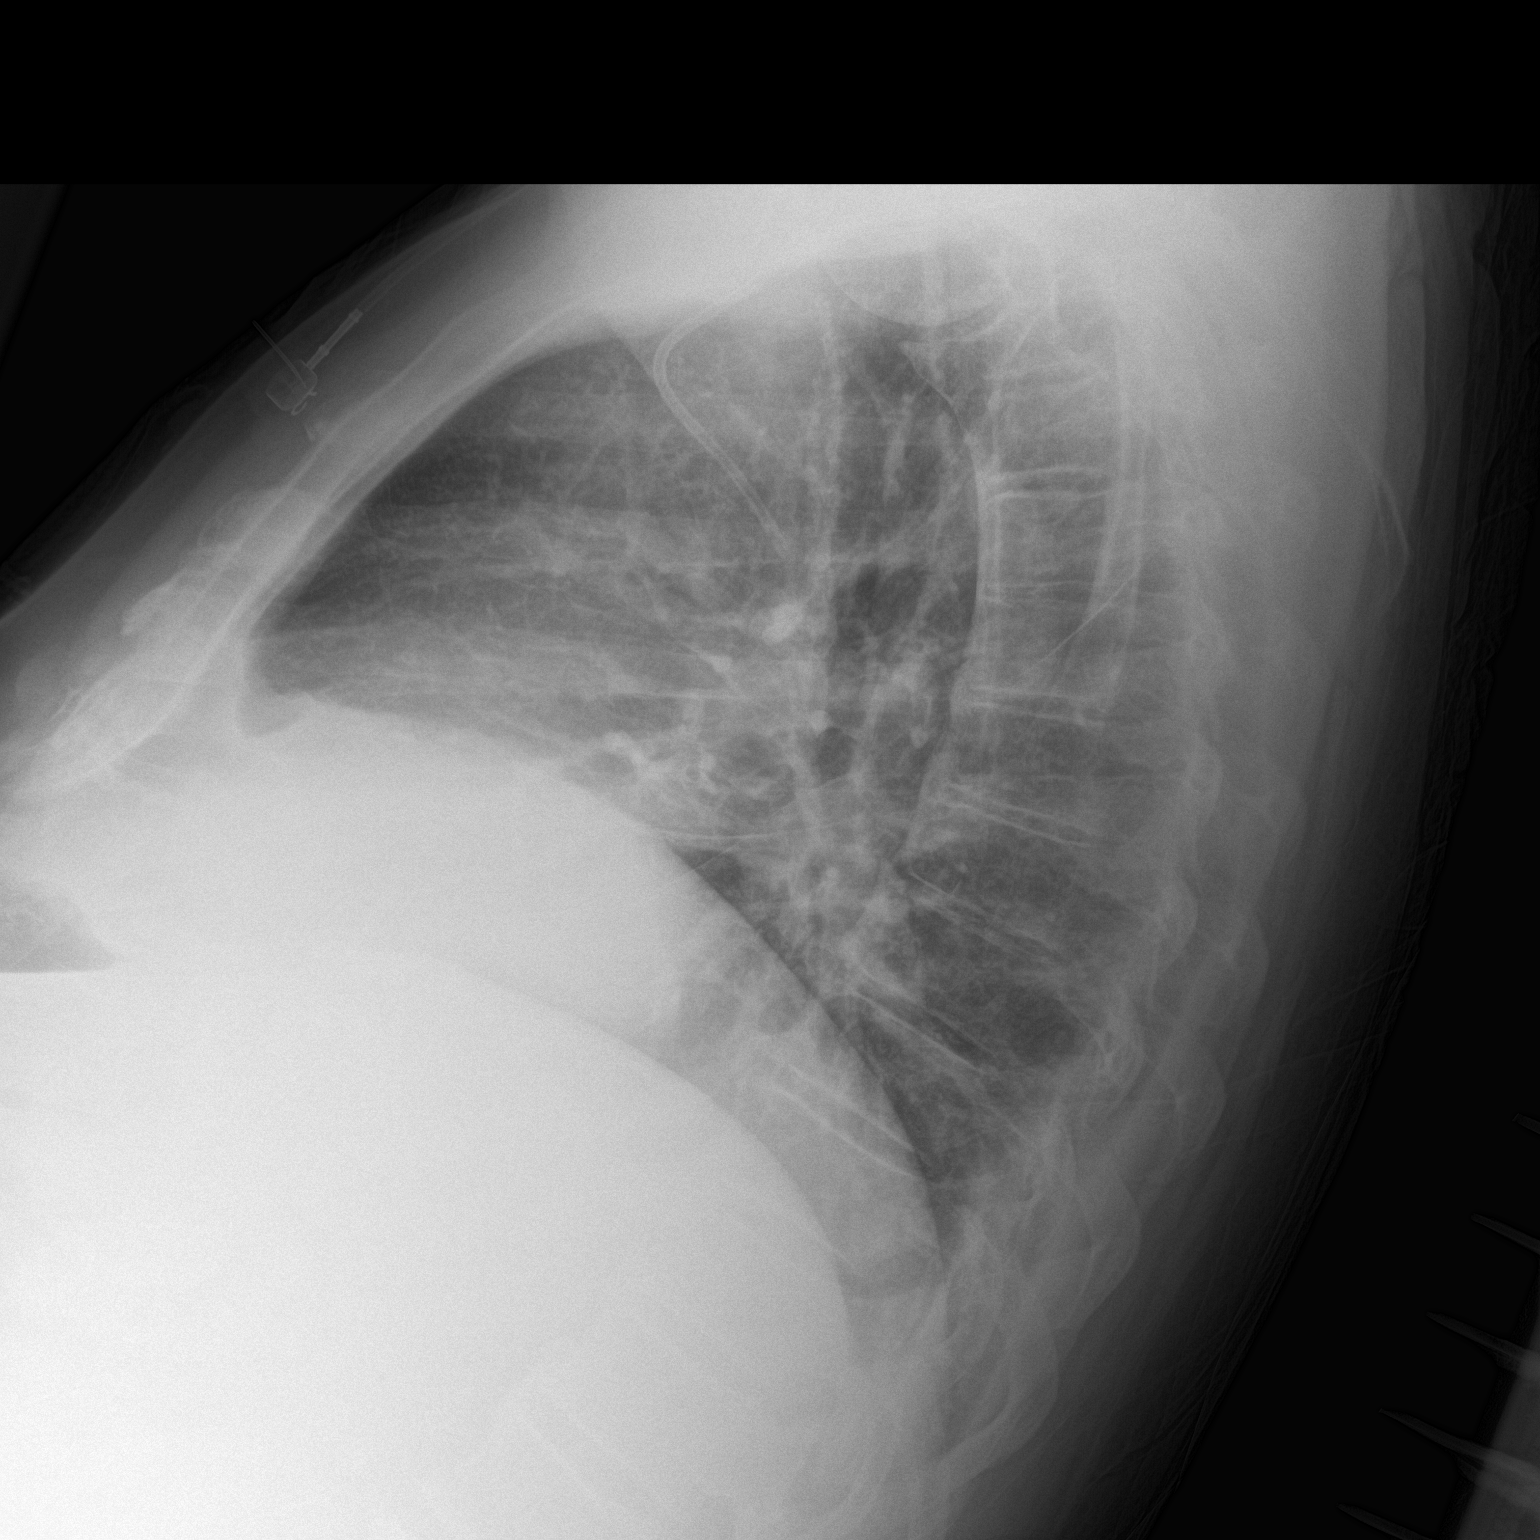
[im 2/2]
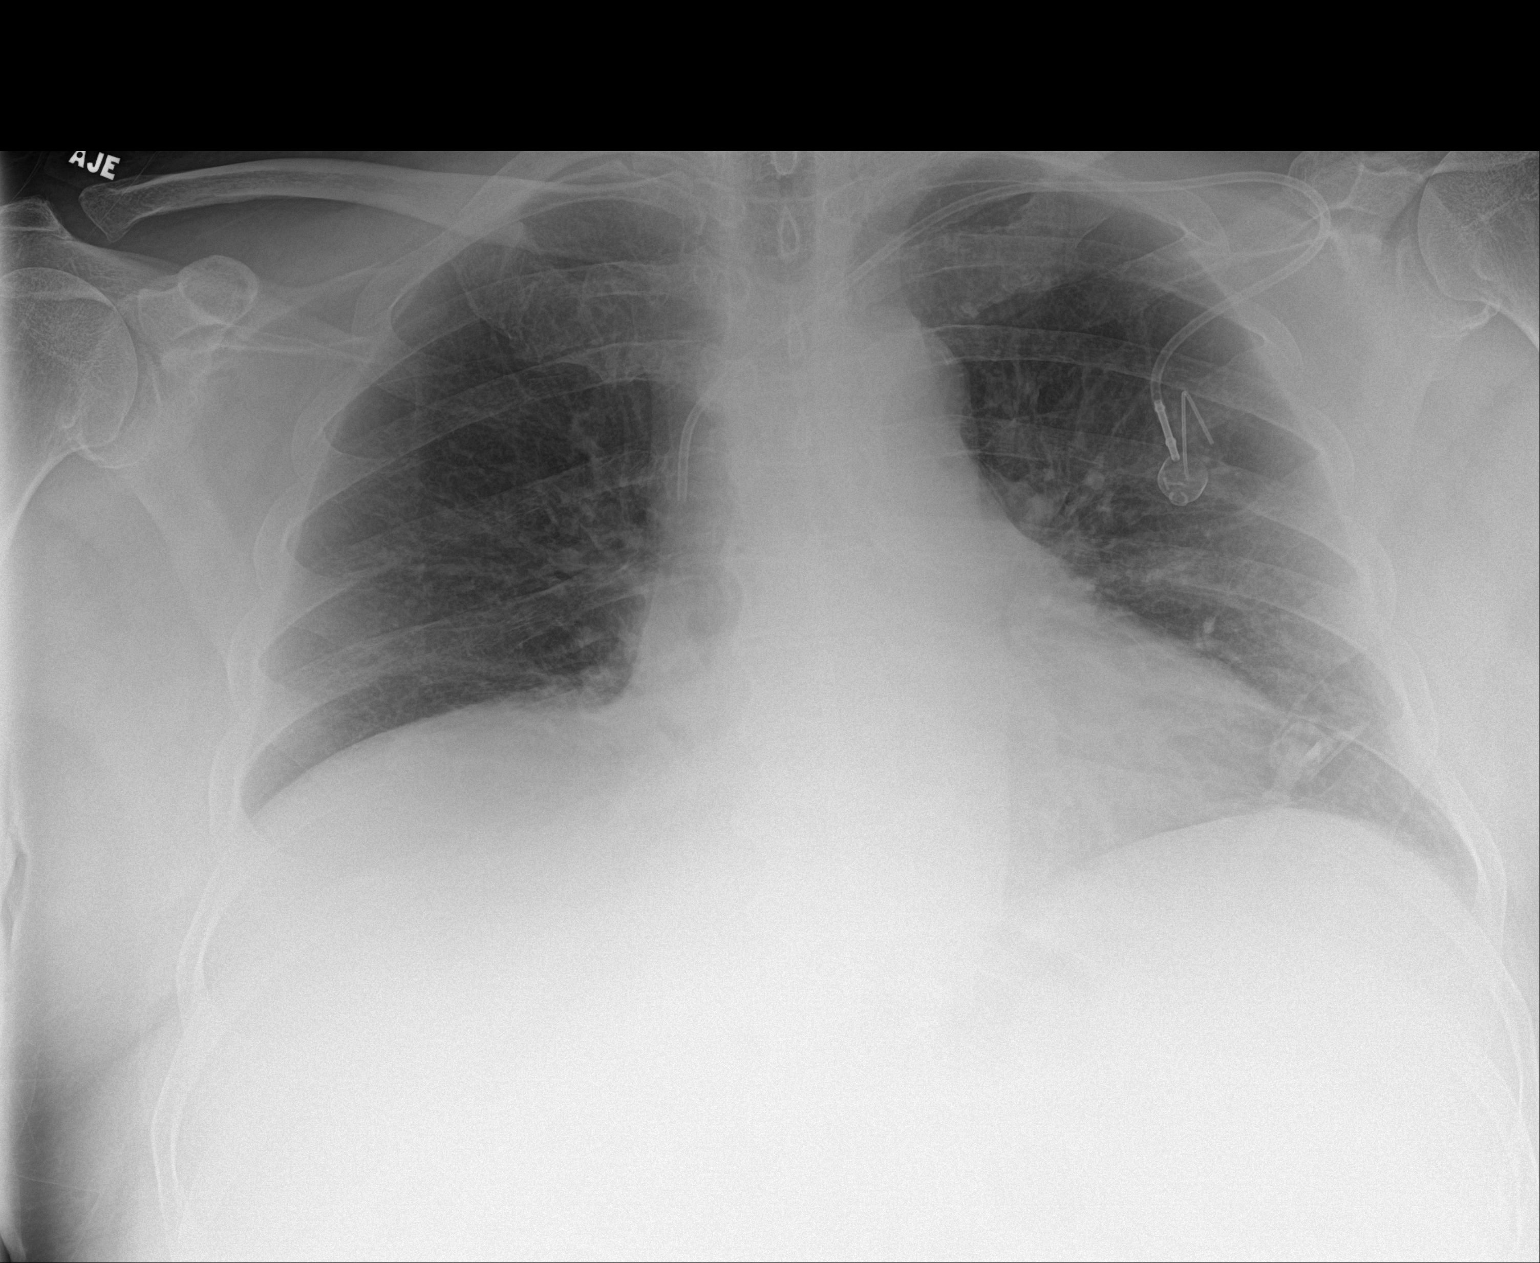

[2 of 2 positions shown; findings below may reference images not displayed]

FINDINGS: Port-A-Cath tip is in the superior vena cava. No pneumothorax. No
edema or consolidation. Heart is upper normal in size with pulmonary
vascularity within normal limits. No adenopathy. There is
degenerative change in the thoracic spine.
IMPRESSION: No edema or consolidation.

## 2017-08-13 ENCOUNTER — Other Ambulatory Visit: Payer: Self-pay | Admitting: Internal Medicine
